# Patient Record
Sex: Female | Born: 1965
Health system: Southern US, Community
[De-identification: ages and names within clinical notes are randomized; demographics above are authoritative.]

## PROBLEM LIST (undated history)

## (undated) DIAGNOSIS — E669 Obesity, unspecified: Secondary | ICD-10-CM

## (undated) DIAGNOSIS — K219 Gastro-esophageal reflux disease without esophagitis: Secondary | ICD-10-CM

## (undated) DIAGNOSIS — M1990 Unspecified osteoarthritis, unspecified site: Secondary | ICD-10-CM

## (undated) DIAGNOSIS — Z87891 Personal history of nicotine dependence: Secondary | ICD-10-CM

## (undated) DIAGNOSIS — J329 Chronic sinusitis, unspecified: Secondary | ICD-10-CM

## (undated) DIAGNOSIS — F329 Major depressive disorder, single episode, unspecified: Secondary | ICD-10-CM

## (undated) DIAGNOSIS — R195 Other fecal abnormalities: Principal | ICD-10-CM

## (undated) DIAGNOSIS — I739 Peripheral vascular disease, unspecified: Secondary | ICD-10-CM

## (undated) DIAGNOSIS — F32A Depression, unspecified: Secondary | ICD-10-CM

## (undated) DIAGNOSIS — I219 Acute myocardial infarction, unspecified: Secondary | ICD-10-CM

## (undated) DIAGNOSIS — Z8541 Personal history of malignant neoplasm of cervix uteri: Secondary | ICD-10-CM

## (undated) DIAGNOSIS — R7303 Prediabetes: Secondary | ICD-10-CM

## (undated) DIAGNOSIS — E785 Hyperlipidemia, unspecified: Secondary | ICD-10-CM

## (undated) DIAGNOSIS — I2510 Atherosclerotic heart disease of native coronary artery without angina pectoris: Secondary | ICD-10-CM

## (undated) DIAGNOSIS — F419 Anxiety disorder, unspecified: Secondary | ICD-10-CM

## (undated) DIAGNOSIS — H018 Other specified inflammations of eyelid: Secondary | ICD-10-CM

## (undated) HISTORY — DX: Other fecal abnormalities: R19.5

## (undated) HISTORY — PX: OTHER SURGICAL HISTORY: SHX169

## (undated) HISTORY — PX: TONSILLECTOMY: SUR1361

## (undated) HISTORY — DX: Gastro-esophageal reflux disease without esophagitis: K21.9

## (undated) HISTORY — DX: Peripheral vascular disease, unspecified: I73.9

## (undated) HISTORY — DX: Personal history of malignant neoplasm of cervix uteri: Z85.41

## (undated) HISTORY — DX: Personal history of nicotine dependence: Z87.891

## (undated) HISTORY — DX: Other specified inflammations of eyelid: H01.8

## (undated) HISTORY — DX: Obesity, unspecified: E66.9

## (undated) HISTORY — DX: Chronic sinusitis, unspecified: J32.9

## (undated) HISTORY — PX: CARDIAC CATHETERIZATION: SHX172

## (undated) HISTORY — DX: Atherosclerotic heart disease of native coronary artery without angina pectoris: I25.10

## (undated) HISTORY — PX: DILATION AND CURETTAGE OF UTERUS: SHX78

## (undated) HISTORY — DX: Unspecified osteoarthritis, unspecified site: M19.90

---

## 2008-06-12 LAB — HM PAP SMEAR

## 2008-07-23 ENCOUNTER — Ambulatory Visit: Payer: Self-pay

## 2009-07-24 ENCOUNTER — Ambulatory Visit: Payer: Self-pay | Admitting: Internal Medicine

## 2009-07-28 ENCOUNTER — Ambulatory Visit: Payer: Self-pay | Admitting: Internal Medicine

## 2009-11-16 ENCOUNTER — Ambulatory Visit: Payer: Self-pay | Admitting: Internal Medicine

## 2010-07-21 ENCOUNTER — Observation Stay: Payer: Self-pay | Admitting: Internal Medicine

## 2010-08-04 ENCOUNTER — Ambulatory Visit: Payer: Self-pay | Admitting: Internal Medicine

## 2010-08-16 ENCOUNTER — Ambulatory Visit: Payer: Self-pay | Admitting: Internal Medicine

## 2011-04-29 ENCOUNTER — Encounter: Payer: Self-pay | Admitting: Internal Medicine

## 2011-05-10 ENCOUNTER — Encounter: Payer: Self-pay | Admitting: Internal Medicine

## 2011-05-10 ENCOUNTER — Ambulatory Visit (INDEPENDENT_AMBULATORY_CARE_PROVIDER_SITE_OTHER): Payer: Medicare Other | Admitting: Internal Medicine

## 2011-05-10 VITALS — BP 114/68 | HR 82 | Temp 97.4°F | Resp 16 | Ht 63.5 in | Wt 161.5 lb

## 2011-05-10 DIAGNOSIS — E785 Hyperlipidemia, unspecified: Secondary | ICD-10-CM

## 2011-05-10 DIAGNOSIS — Z1239 Encounter for other screening for malignant neoplasm of breast: Secondary | ICD-10-CM

## 2011-05-10 DIAGNOSIS — R5383 Other fatigue: Secondary | ICD-10-CM

## 2011-05-10 DIAGNOSIS — N921 Excessive and frequent menstruation with irregular cycle: Secondary | ICD-10-CM | POA: Insufficient documentation

## 2011-05-10 DIAGNOSIS — R1011 Right upper quadrant pain: Secondary | ICD-10-CM

## 2011-05-10 DIAGNOSIS — F411 Generalized anxiety disorder: Secondary | ICD-10-CM

## 2011-05-10 DIAGNOSIS — Z01419 Encounter for gynecological examination (general) (routine) without abnormal findings: Secondary | ICD-10-CM | POA: Insufficient documentation

## 2011-05-10 DIAGNOSIS — N939 Abnormal uterine and vaginal bleeding, unspecified: Secondary | ICD-10-CM

## 2011-05-10 DIAGNOSIS — N920 Excessive and frequent menstruation with regular cycle: Secondary | ICD-10-CM

## 2011-05-10 DIAGNOSIS — R5381 Other malaise: Secondary | ICD-10-CM

## 2011-05-10 DIAGNOSIS — Z124 Encounter for screening for malignant neoplasm of cervix: Secondary | ICD-10-CM

## 2011-05-10 LAB — CBC WITH DIFFERENTIAL/PLATELET
Basophils Absolute: 0 10*3/uL (ref 0.0–0.1)
Eosinophils Absolute: 0.1 10*3/uL (ref 0.0–0.7)
HCT: 35.9 % — ABNORMAL LOW (ref 36.0–46.0)
Lymphs Abs: 2.3 10*3/uL (ref 0.7–4.0)
MCHC: 33.4 g/dL (ref 30.0–36.0)
MCV: 84.7 fl (ref 78.0–100.0)
Monocytes Absolute: 0.5 10*3/uL (ref 0.1–1.0)
Neutro Abs: 5.5 10*3/uL (ref 1.4–7.7)
Platelets: 241 10*3/uL (ref 150.0–400.0)
RDW: 14.3 % (ref 11.5–14.6)

## 2011-05-10 LAB — COMPREHENSIVE METABOLIC PANEL
ALT: 20 U/L (ref 0–35)
Alkaline Phosphatase: 139 U/L — ABNORMAL HIGH (ref 39–117)
Creatinine, Ser: 0.9 mg/dL (ref 0.4–1.2)
Sodium: 139 mEq/L (ref 135–145)
Total Bilirubin: 0.6 mg/dL (ref 0.3–1.2)
Total Protein: 7.1 g/dL (ref 6.0–8.3)

## 2011-05-10 MED ORDER — BUSPIRONE HCL 30 MG PO TABS: 30.0000 mg | ORAL_TABLET | Freq: Two times a day (BID) | ORAL | Status: DC

## 2011-05-10 MED ORDER — ALPRAZOLAM 0.5 MG PO TABS: 0.5000 mg | ORAL_TABLET | Freq: Two times a day (BID) | ORAL | Status: AC | PRN

## 2011-05-10 NOTE — Patient Instructions (Addendum)
Continue buspirone 30 mg twice daily for anxiety Decrease the alprazolam dose to 0.5 mg up to two times daily  We are scheduling you for two ultrasounds.

## 2011-05-10 NOTE — Assessment & Plan Note (Signed)
PAP smear was done today.  Cervix was difficult to isolate due to retroverted position.

## 2011-05-10 NOTE — Assessment & Plan Note (Signed)
She has had minimal improvement over the past 2 years with trials of SSRIs.  Currently she is taking bispirone and alprazolam.  Her main manifestation is crying, which she does easily whever her children are discussed even in a favorable light.  I am increasing the buspirone to 30 mg twice daily and changing the alprazolam to ER as previously prescribed but not filled.  Prior trial of celexa caused weight gain which she did not tolerate despite improvement in symptoms.  I have again suggested that psychotherapy would be in her best interest but she is not interested .

## 2011-05-10 NOTE — Assessment & Plan Note (Signed)
With normal LH/FSH last year, I see no reason to repeat them.  Will check a TSH.  She has no history of obesity or hypertriglyceridemia.  Also checking a transvaginal u/s given to rule out fibroid uterus and evaluate her tenderness on the right side during manual exam.

## 2011-05-10 NOTE — Progress Notes (Signed)
  Subjective:    Patient ID: Kayla Farmer, female    DOB: Apr 09, 1966, 46 y.o.   MRN: [id number]  HPI Here for her annual physical.  Having menstrual irregularity with periods sometimes occuring twice monthly.  Bleeds 5 to 7 days , occasionally for 2 weeks,  Very heavy at times. Sometimes moderate cramping. Has had menstrual irregularities for 15 years,  No prior workup.     Review of Systems  Constitutional: Negative for fever, chills and unexpected weight change.  HENT: Negative for hearing loss, ear pain, nosebleeds, congestion, sore throat, facial swelling, rhinorrhea, sneezing, mouth sores, trouble swallowing, neck pain, neck stiffness, voice change, postnasal drip, sinus pressure, tinnitus and ear discharge.   Eyes: Negative for pain, discharge, redness and visual disturbance.  Respiratory: Negative for cough, chest tightness, shortness of breath, wheezing and stridor.   Cardiovascular: Negative for chest pain, palpitations and leg swelling.  Musculoskeletal: Negative for myalgias and arthralgias.  Skin: Negative for color change and rash.  Neurological: Negative for dizziness, weakness, light-headedness and headaches.  Hematological: Negative for adenopathy.       Objective:   Physical Exam  Constitutional: She is oriented to person, place, and time. She appears well-developed and well-nourished.  HENT:  Mouth/Throat: Oropharynx is clear and moist.  Eyes: EOM are normal. Pupils are equal, round, and reactive to light. No scleral icterus.  Neck: Normal range of motion. Neck supple. No JVD present. No thyromegaly present.  Cardiovascular: Normal rate, regular rhythm, normal heart sounds and intact distal pulses.   Pulmonary/Chest: Effort normal and breath sounds normal.  Abdominal: Soft. Bowel sounds are normal. She exhibits no mass. There is tenderness.       RUQ tenderness  Genitourinary: Vagina normal. No vaginal discharge found.       Tender in the RLQ ovary feels enlarged    Musculoskeletal: Normal range of motion. She exhibits no edema.  Lymphadenopathy:    She has no cervical adenopathy.  Neurological: She is alert and oriented to person, place, and time.  Skin: Skin is warm and dry.  Psychiatric: She has a normal mood and affect.          Assessment & Plan:

## 2011-05-10 NOTE — Assessment & Plan Note (Signed)
She has had annual mammograms which have been normal.  breast exam was done today

## 2011-05-11 ENCOUNTER — Other Ambulatory Visit (HOSPITAL_COMMUNITY)
Admission: RE | Admit: 2011-05-11 | Discharge: 2011-05-11 | Disposition: A | Discharge status: Home / Self Care | Payer: Medicare Other | Source: Ambulatory Visit | Attending: Internal Medicine | Admitting: Internal Medicine

## 2011-06-13 ENCOUNTER — Ambulatory Visit: Payer: Self-pay | Admitting: Internal Medicine

## 2011-06-20 ENCOUNTER — Encounter: Payer: Self-pay | Admitting: Internal Medicine

## 2011-06-20 ENCOUNTER — Telehealth: Payer: Self-pay | Admitting: Internal Medicine

## 2011-06-20 NOTE — Telephone Encounter (Signed)
Message copied by Andreas Ohm on Mon Jun 20, 2011 12:00 PM ------      Message from: Deborra Medina      Created: Wed Jun 15, 2011  6:28 PM      Regarding: ultrasound results       Her transvaginal ultraosound did not show any fibroids or masses  in the uterus , but she had a2 cm cyst on the right ovary that should be followed, so I would like her to see a gynecologist for followup.  Does she have a preference?

## 2011-06-20 NOTE — Telephone Encounter (Signed)
Notified patient of results, she does not have a preference on a gynecologist.  Please advise.

## 2011-06-21 NOTE — Telephone Encounter (Signed)
Either Dr. Enzo Bi at [facility] or Dr. Ferne Reus at [facility],  Whichever can see her sooner.

## 2011-07-04 ENCOUNTER — Telehealth: Payer: Self-pay | Admitting: Internal Medicine

## 2011-07-04 NOTE — Telephone Encounter (Signed)
Pt called checking her referral about gyn please call pt

## 2011-07-05 ENCOUNTER — Telehealth: Payer: Self-pay | Admitting: Internal Medicine

## 2011-07-05 NOTE — Telephone Encounter (Addendum)
Patient is needing a referral for her cyst on her ovary. Patient still having problem with pain down her side and in her back.

## 2011-07-05 NOTE — Telephone Encounter (Signed)
Yes, Dr. Derrel Nip wants her set up GYN.  See Telephone encounter from 10/8.  Can you set this up please?

## 2011-07-08 NOTE — Telephone Encounter (Signed)
I called and made appt at [facility] for patinet with Dr. Erik Obey for Monday Oct. 29, 2012 at 2:20 I called and advised patient of appointment information.  She is going to call and reschedule this appt to an appt. In the AM.

## 2011-08-30 ENCOUNTER — Ambulatory Visit: Payer: Self-pay | Admitting: Obstetrics and Gynecology

## 2011-09-28 ENCOUNTER — Ambulatory Visit: Payer: Self-pay | Admitting: General Surgery

## 2011-11-15 ENCOUNTER — Encounter: Payer: Self-pay | Admitting: Internal Medicine

## 2011-11-20 ENCOUNTER — Other Ambulatory Visit: Payer: Self-pay | Admitting: Internal Medicine

## 2012-05-20 ENCOUNTER — Other Ambulatory Visit: Payer: Self-pay | Admitting: Internal Medicine

## 2012-06-27 LAB — HM MAMMOGRAPHY: HM Mammogram: NORMAL

## 2012-08-27 ENCOUNTER — Other Ambulatory Visit: Payer: Self-pay | Admitting: Internal Medicine

## 2012-08-27 ENCOUNTER — Other Ambulatory Visit: Payer: Self-pay

## 2012-08-27 MED ORDER — OMEPRAZOLE 40 MG PO CPDR: 40.0000 mg | DELAYED_RELEASE_CAPSULE | Freq: Every day | ORAL | Status: DC

## 2012-08-27 NOTE — Telephone Encounter (Signed)
Refill for Omeprazole 40 mg # 30 3 R sent to [facility]

## 2012-09-13 ENCOUNTER — Ambulatory Visit: Payer: Self-pay | Admitting: Obstetrics and Gynecology

## 2013-05-23 ENCOUNTER — Other Ambulatory Visit: Payer: Self-pay | Admitting: Internal Medicine

## 2013-05-24 NOTE — Telephone Encounter (Signed)
Left message on cellphone to let pt know that she NTBS before we can refill her medications

## 2013-05-24 NOTE — Telephone Encounter (Signed)
?  Refill or make appt first-Pt has not been seen since 05/10/11

## 2013-05-24 NOTE — Telephone Encounter (Signed)
No refills without appt

## 2013-05-28 ENCOUNTER — Encounter: Payer: Self-pay | Admitting: Internal Medicine

## 2013-05-28 ENCOUNTER — Ambulatory Visit (INDEPENDENT_AMBULATORY_CARE_PROVIDER_SITE_OTHER): Payer: Medicare Other | Admitting: Internal Medicine

## 2013-05-28 VITALS — BP 118/78 | HR 64 | Temp 97.7°F | Resp 14 | Ht 63.25 in | Wt 158.8 lb

## 2013-05-28 DIAGNOSIS — E785 Hyperlipidemia, unspecified: Secondary | ICD-10-CM

## 2013-05-28 DIAGNOSIS — Z23 Encounter for immunization: Secondary | ICD-10-CM

## 2013-05-28 DIAGNOSIS — H543 Unqualified visual loss, both eyes: Secondary | ICD-10-CM | POA: Insufficient documentation

## 2013-05-28 DIAGNOSIS — K589 Irritable bowel syndrome without diarrhea: Secondary | ICD-10-CM

## 2013-05-28 DIAGNOSIS — R5381 Other malaise: Secondary | ICD-10-CM

## 2013-05-28 DIAGNOSIS — F411 Generalized anxiety disorder: Secondary | ICD-10-CM

## 2013-05-28 LAB — COMPREHENSIVE METABOLIC PANEL
AST: 17 U/L (ref 0–37)
Alkaline Phosphatase: 113 U/L (ref 39–117)
BUN: 16 mg/dL (ref 6–23)
Creatinine, Ser: 0.9 mg/dL (ref 0.4–1.2)
Glucose, Bld: 90 mg/dL (ref 70–99)
Total Bilirubin: 0.5 mg/dL (ref 0.3–1.2)

## 2013-05-28 LAB — CBC WITH DIFFERENTIAL/PLATELET
Basophils Relative: 0.7 % (ref 0.0–3.0)
Eosinophils Relative: 1.1 % (ref 0.0–5.0)
MCV: 83.6 fl (ref 78.0–100.0)
Monocytes Absolute: 0.4 10*3/uL (ref 0.1–1.0)
Monocytes Relative: 4.5 % (ref 3.0–12.0)
Neutrophils Relative %: 64.1 % (ref 43.0–77.0)
Platelets: 175 10*3/uL (ref 150.0–400.0)
RBC: 4.58 Mil/uL (ref 3.87–5.11)
WBC: 8.5 10*3/uL (ref 4.5–10.5)

## 2013-05-28 LAB — LIPID PANEL
Cholesterol: 179 mg/dL (ref 0–200)
LDL Cholesterol: 104 mg/dL — ABNORMAL HIGH (ref 0–99)
VLDL: 22.4 mg/dL (ref 0.0–40.0)

## 2013-05-28 LAB — TSH: TSH: 0.65 u[IU]/mL (ref 0.35–5.50)

## 2013-05-28 MED ORDER — BUSPIRONE HCL 30 MG PO TABS: ORAL_TABLET | ORAL | Status: DC

## 2013-05-28 MED ORDER — DICYCLOMINE HCL 20 MG PO TABS: 20.0000 mg | ORAL_TABLET | Freq: Four times a day (QID) | ORAL | Status: DC

## 2013-05-28 NOTE — Assessment & Plan Note (Signed)
continue Buspar.  Again recommended referral to psychiatry.

## 2013-05-28 NOTE — Patient Instructions (Addendum)
Your stomach and bowel symptoms sound like "irritable Bowel syndrome  Try taking the dicyclomine 30 minutes before your meal  Try taking Mylanta Gas after a meal for the gas pains   If dairy give you gas .  You need to try lactase (over the counter) with your meal.   If you change your mind about seeing a doctor or therapist to help your anxiety ,  Please call me back and we will arrange it for you  You received the tetanus diptheria and pertussis vaccine today

## 2013-05-28 NOTE — Assessment & Plan Note (Signed)
Trial of dicyclomine for postprandial cramping and diarrhea.

## 2013-05-28 NOTE — Assessment & Plan Note (Signed)
Recommended formal eye exam with [facility]

## 2013-05-28 NOTE — Progress Notes (Signed)
Patient ID: Kayla Farmer, female   DOB: [birth year], 47 y.o.   MRN: [id number]   Patient Active Problem List   Diagnosis Date Noted  . Decreased vision in both eyes 05/28/2013  . Irritable bowel syndrome 05/28/2013  . Generalized anxiety disorder 05/10/2011  . Screening for cervical cancer 05/10/2011  . Screening for breast cancer 05/10/2011  . Menometrorrhagia 05/10/2011    Subjective:  CC:   Chief Complaint  Patient presents with  . Follow-up    al most 2 years since last visit    HPI:   Kayla Farmer a 47 y.o. female who presents 2 year follow up on chronic conditions  including social anxiety disorder Last seen 2 years ago. Prior trials of SSRIS cuased weigth gain.  Tolerating Buspar for the past 2 years .  Has historically deferred psychiatry referral.  Has noted Decreased vision over the last year but has not made a referral for an eye exam yet.   Couple of minor injuries this summer:  1) Rightadductor muscle pain since stopping off the tuck bed and hearing a pop,  No bruising at the time   2) right wrist pain since missing the back door step.  Wore a brace on the wrist  For a couple of weeks   History of carpal tunnel and  Tendonitis /wrist sprain.  Marland Kitchen  usins prn aleve and ibuprofen   Mood disorder:  Son doing well,  Living apart .  Other son graduates this year.  Gest tearful just talking about her mood.  Has episodes of loose stools occurring once or twice a week,  Very sudden ,  when it occurs she has several loose stools per day ,  Then resolves in 24 hours. Not aggravated by food,  Does not drink coffee.  Occasionally occurs post prandially. 6 months to one year   Not escalating. .  Does not alternate  with constipation.  Was Having problems with crampy abdominal pain not frelieved by daily use of Prilosec and was referred by GYN for a CT scan at [facility] with oral contrast  whifch caused uncontrollable diarrhea afterward at the [place].  CT scan was normal.    Past  Medical History  Diagnosis Date  . Other inflammations of eyelids   . History of cervical cancer     No past surgical history on file.     The following portions of the patient's history were reviewed and updated as appropriate: Allergies, current medications, and problem list.    Review of Systems:   12 Pt  review of systems was negative except those addressed in the HPI,     History   Social History  . Marital Status: Married    Spouse Name: N/A    Number of Children: N/A  . Years of Education: N/A   Occupational History  . Not on file.   Social History Main Topics  . Smoking status: Current Every Day Smoker -- 1.00 packs/day    Types: Cigarettes  . Smokeless tobacco: Never Used     Comment: smokes one pack of cigarettes per day since age 50, quit during pregnancies.  . Alcohol Use: No  . Drug Use: No  . Sexual Activity: Not on file   Other Topics Concern  . Not on file   Social History Narrative   Lives with spouse and children.  Daughter in Sports coach of Loletha Carrow and Josph Macho.   Quit high school in the 9th grade.  Was raised by grandmother due  to parental conflicts.    Objective:  Filed Vitals:   05/28/13 0937  BP: 118/78  Pulse: 64  Temp: 97.7 F (36.5 C)  Resp: 14     General appearance: alert, cooperative and appears stated age Ears: normal TM's and external ear canals both ears Throat: lips, mucosa, and tongue normal; teeth and gums normal Neck: no adenopathy, no carotid bruit, supple, symmetrical, trachea midline and thyroid not enlarged, symmetric, no tenderness/mass/nodules Back: symmetric, no curvature. ROM normal. No CVA tenderness. Lungs: clear to auscultation bilaterally Heart: regular rate and rhythm, S1, S2 normal, no murmur, click, rub or gallop Abdomen: soft, non-tender; bowel sounds normal; no masses,  no organomegaly Pulses: 2+ and symmetric Skin: Skin color, texture, turgor normal. No rashes or lesions Lymph nodes: Cervical,  supraclavicular, and axillary nodes normal.  Assessment and Plan:  Generalized anxiety disorder continue Buspar.  Again recommended referral to psychiatry.  Decreased vision in both eyes Recommended formal eye exam with [facility]   Irritable bowel syndrome Trial of dicyclomine for postprandial cramping and diarrhea.    Updated Medication List Outpatient Encounter Prescriptions as of 05/28/2013  Medication Sig Dispense Refill  . busPIRone (BUSPAR) 30 MG tablet take 1 tablet by mouth twice a day  60 tablet  5  . meloxicam (MOBIC) 15 MG tablet Take one by mouth daily as needed for shoulder pain.       Marland Kitchen omeprazole (PRILOSEC) 40 MG capsule Take 1 capsule (40 mg total) by mouth daily. Take one by mouth daily as needed  30 capsule  3  . [DISCONTINUED] busPIRone (BUSPAR) 30 MG tablet take 1 tablet by mouth twice a day  60 tablet  5  . dicyclomine (BENTYL) 20 MG tablet Take 1 tablet (20 mg total) by mouth every 6 (six) hours.  90 tablet  3  . estradiol (ESTRACE) 0.5 MG tablet Take 1 tablet by mouth daily.      . [DISCONTINUED] methocarbamol (ROBAXIN) 500 MG tablet Take one by mouth 3 times daily as needed for muscle spasms       . [DISCONTINUED] terbinafine (LAMISIL) 250 MG tablet Take 250 mg by mouth daily.         No facility-administered encounter medications on file as of 05/28/2013.     Orders Placed This Encounter  Procedures  . HM MAMMOGRAPHY  . Tdap vaccine greater than or equal to 7yo IM  . HM PAP SMEAR  . Lipid panel  . TSH  . Comprehensive metabolic panel  . CBC with Differential    No Follow-up on file.

## 2013-05-30 ENCOUNTER — Encounter: Payer: Self-pay | Admitting: *Deleted

## 2013-09-16 ENCOUNTER — Other Ambulatory Visit: Payer: Self-pay | Admitting: *Deleted

## 2013-09-16 MED ORDER — OMEPRAZOLE 40 MG PO CPDR: 40.0000 mg | DELAYED_RELEASE_CAPSULE | Freq: Every day | ORAL | Status: DC

## 2013-09-17 ENCOUNTER — Ambulatory Visit: Payer: Self-pay | Admitting: Obstetrics and Gynecology

## 2013-10-23 ENCOUNTER — Telehealth: Payer: Self-pay | Admitting: Internal Medicine

## 2013-10-23 NOTE — Telephone Encounter (Signed)
Error

## 2013-10-24 ENCOUNTER — Encounter (INDEPENDENT_AMBULATORY_CARE_PROVIDER_SITE_OTHER): Payer: Self-pay

## 2013-10-24 ENCOUNTER — Encounter: Payer: Self-pay | Admitting: Adult Health

## 2013-10-24 ENCOUNTER — Ambulatory Visit (INDEPENDENT_AMBULATORY_CARE_PROVIDER_SITE_OTHER): Payer: Medicare Other | Admitting: Adult Health

## 2013-10-24 VITALS — BP 120/74 | HR 89 | Temp 98.2°F | Resp 14 | Wt 159.8 lb

## 2013-10-24 DIAGNOSIS — F411 Generalized anxiety disorder: Secondary | ICD-10-CM

## 2013-10-24 DIAGNOSIS — N912 Amenorrhea, unspecified: Secondary | ICD-10-CM

## 2013-10-24 LAB — FOLLICLE STIMULATING HORMONE: FSH: 14.2 m[IU]/mL

## 2013-10-24 LAB — LUTEINIZING HORMONE: LH: 32.27 m[IU]/mL

## 2013-10-24 LAB — POCT URINE PREGNANCY: Preg Test, Ur: NEGATIVE

## 2013-10-24 NOTE — Patient Instructions (Signed)
  Please have your labs drawn prior to leaving the office.  We will notify you of the results once they are available..  I am referring you to Psychiatry for your uncontrolled anxiety. You will be called with an appointment.

## 2013-10-24 NOTE — Progress Notes (Signed)
Pre visit review using our clinic review tool, if applicable. No additional management support is needed unless otherwise documented below in the visit note. 

## 2013-10-24 NOTE — Progress Notes (Signed)
Patient ID: Kayla Farmer, female   DOB: 01-16-1966, 48 y.o.   MRN: [id number]    Subjective:    Patient ID: Kayla Farmer, female    DOB: 1965/10/15, 48 y.o.   MRN: [id number]  HPI  Pt present to clinic with c/o "nerves". She is crying uncontrollably. States "I am always like this". She reports being like this since childhood. Feels her symptoms are progressing. She has multiple crying episodes daily. She takes buspar with good results. Only taking daily 2/2 weight gain. Was on Lexapro and doing well but stopped 2/2 weight gain.  Concerned about not having period since December. Sexually active. No other symptoms.  Past Medical History  Diagnosis Date  . Other inflammations of eyelids   . History of cervical cancer     Current Outpatient Prescriptions on File Prior to Visit  Medication Sig Dispense Refill  . busPIRone (BUSPAR) 30 MG tablet take 1 tablet by mouth twice a day  60 tablet  5  . estradiol (ESTRACE) 0.5 MG tablet Take 1 tablet by mouth daily.      Marland Kitchen omeprazole (PRILOSEC) 40 MG capsule Take 1 capsule (40 mg total) by mouth daily. Take one by mouth daily as needed  30 capsule  5   No current facility-administered medications on file prior to visit.     Review of Systems  Constitutional: Negative.   Respiratory: Negative.   Cardiovascular: Negative.   Gastrointestinal:       Ongoing, intermittent abdominal discomfort. Has been w/u with CT, ultrasound which were negative. Denies pain this morning.  Genitourinary:       No menstrual cycle since December 2014  Psychiatric/Behavioral: Negative for suicidal ideas and self-injury. The patient is nervous/anxious.        Uncontrolled crying, increased anxiety       Objective:  BP 120/74  Pulse 89  Temp(Src) 98.2 F (36.8 C) (Oral)  Resp 14  Wt 159 lb 12.8 oz (72.485 kg)  SpO2 97%  LMP 08/27/2013   Physical Exam  Constitutional: She is oriented to person, place, and time. She appears well-developed and well-nourished. No  distress.  Cardiovascular: Normal rate, regular rhythm and normal heart sounds.   Pulmonary/Chest: Effort normal and breath sounds normal. No respiratory distress. She has no wheezes. She has no rales.  Abdominal: Soft. Bowel sounds are normal. She exhibits no distension and no mass. There is no tenderness. There is no rebound and no guarding.  Musculoskeletal: Normal range of motion.  Neurological: She is alert and oriented to person, place, and time.  Psychiatric: Judgment and thought content normal.  Uncontrollably crying throughout visit. Unable to state reason for her crying other than "I've always been like this"          Assessment & Plan:    1. Generalized anxiety disorder This has been a chronic problem. She has been on Lexapro and felt she was doing well; however, it made her gain weight and she discontinued the medication. She was prescribed buspar bid prn but is only taking daily for the same reason. Her PCP has been recommending referral to psychiatry. She is agreeable. Will refer.  2. Amenorrhea Urine preg negative Check FSH, LH - Discussed not considered menopausal until no period for 12 months. She will still need to use precaution to prevent pregnancy.

## 2013-10-25 ENCOUNTER — Telehealth: Payer: Self-pay | Admitting: Internal Medicine

## 2013-10-25 NOTE — Telephone Encounter (Signed)
Relevant patient education mailed to patient.  

## 2014-02-05 ENCOUNTER — Telehealth: Payer: Self-pay | Admitting: Internal Medicine

## 2014-02-05 DIAGNOSIS — L247 Irritant contact dermatitis due to plants, except food: Secondary | ICD-10-CM

## 2014-02-05 MED ORDER — PREDNISONE (PAK) 10 MG PO TABS: ORAL_TABLET | ORAL | Status: DC

## 2014-02-05 NOTE — Telephone Encounter (Signed)
Patient Information:  Caller Name: Merissa  Phone: [phone]  Patient: Kayla Farmer, Kayla Farmer  Gender: Female  DOB: [birth year]  Age: 48 Years  PCP: Deborra Medina (Adults only)  Pregnant: No  Office Follow Up:  Does the office need to follow up with this patient?: Yes  Instructions For The Office: PATIENT REQUESTING PRESCRIPTION. NO APPT AT OFFICE.  RN Note:  No appt available in office today. Unable to go to another location. She is requesting Rx.medication.  Pharmacy- [facility] on [address]. PLEASE CONTACT PATIENT  Symptoms  Reason For Call & Symptoms: Patient reports Poison Ivy onset  Sunday , 01/26/14.  Location- on left hand , both arms, both legs,  below lip and it appears to be spreading, +itching and restless sleep.  Patient is requesting Rx.  Son is Radio broadcast assistant , unable to come to office.  Reviewed Health History In EMR: Yes  Reviewed Medications In EMR: Yes  Reviewed Allergies In EMR: Yes  Reviewed Surgeries / Procedures: Yes  Date of Onset of Symptoms: 01/26/2014  Treatments Tried: Calamine Lotion  Treatments Tried Worked: No OB / GYN:  LMP: 10/13/2013  Guideline(s) Used:  Poison [place] or [place]  Disposition Per Guideline:   See Today in Office  Reason For Disposition Reached:   Severe itching interferes with normal activities (e.g., work or school) or prevents sleep  Advice Given:  Hydrocortisone Cream for Itching:   Apply 1% hydrocortisone cream 4 times a day to reduce itching. Use it for 5 days.  Keep the cream in the refrigerator (Reason: it feels better if applied cold).  Apply Cold to the Area:  Soak the involved area in cool water for 20 minutes or massage it with an ice cube as often as necessary to reduce itching and oozing.  Oral Antihistamine Medication for Itching:   Antihistamines may cause sleepiness. Do not drink, drive, or operate dangerous machinery while taking antihistamines.  An over-the-counter antihistamine that causes less sleepiness is  loratadine (e.g., Alavert or Claritin).  Read the package instructions thoroughly on all medications that you take.  Avoid Scratching:   Cut your fingernails short and try not to scratch so as to prevent a secondary infection from bacteria.  New Blisters Appear:  If new blisters occur several days after the first ones, you probably have had ongoing contact with the irritating plant oil. To prevent recurrences: bathe all dogs and wash all clothes and shoes that were with you on the day of exposure.  Expected Course:  Usually lasts 2 weeks. Treatment reduces the severity of the symptoms, not how long they last.  Call Back If:  Rash lasts longer than 3 weeks  It looks infected  You become worse.  RN Overrode Recommendation:  Patient Requests Prescription  PATIENT REQUESTING PRESCRIPTION. NO APPT AT OFFICE.

## 2014-02-05 NOTE — Telephone Encounter (Signed)
Patient notified of instructions and voiced understanding.

## 2014-02-05 NOTE — Telephone Encounter (Signed)
Please advise patient states she cannot come in due to sons graduation patient stated she has poison Ivy on bilateral hands and legs.

## 2014-02-05 NOTE — Telephone Encounter (Signed)
Follow instructiosn by Triage RN.,  predniosne taper sent to pharmacy

## 2014-02-14 ENCOUNTER — Other Ambulatory Visit: Payer: Self-pay | Admitting: *Deleted

## 2014-02-14 MED ORDER — OMEPRAZOLE 40 MG PO CPDR: 40.0000 mg | DELAYED_RELEASE_CAPSULE | Freq: Every day | ORAL | Status: DC

## 2014-05-23 ENCOUNTER — Other Ambulatory Visit: Payer: Self-pay | Admitting: Internal Medicine

## 2014-07-30 ENCOUNTER — Telehealth: Payer: Self-pay

## 2014-07-30 NOTE — Telephone Encounter (Signed)
LVM for pt to call back,    RE: AWV 2015 scheduled with [facility] if possible.

## 2014-08-06 ENCOUNTER — Encounter: Payer: Medicare Other | Admitting: Nurse Practitioner

## 2014-08-18 ENCOUNTER — Other Ambulatory Visit: Payer: Self-pay | Admitting: Internal Medicine

## 2014-09-08 ENCOUNTER — Other Ambulatory Visit: Payer: Self-pay | Admitting: *Deleted

## 2014-09-08 MED ORDER — OMEPRAZOLE 40 MG PO CPDR: 40.0000 mg | DELAYED_RELEASE_CAPSULE | Freq: Every day | ORAL | Status: DC

## 2014-09-18 ENCOUNTER — Encounter: Payer: Self-pay | Admitting: Nurse Practitioner

## 2014-09-18 ENCOUNTER — Ambulatory Visit (INDEPENDENT_AMBULATORY_CARE_PROVIDER_SITE_OTHER): Payer: Medicare Other | Admitting: Nurse Practitioner

## 2014-09-18 VITALS — BP 110/66 | HR 80 | Temp 98.1°F | Resp 12 | Ht 63.25 in | Wt 163.1 lb

## 2014-09-18 DIAGNOSIS — Z1211 Encounter for screening for malignant neoplasm of colon: Secondary | ICD-10-CM | POA: Diagnosis not present

## 2014-09-18 DIAGNOSIS — Z131 Encounter for screening for diabetes mellitus: Secondary | ICD-10-CM | POA: Diagnosis not present

## 2014-09-18 DIAGNOSIS — Z13220 Encounter for screening for lipoid disorders: Secondary | ICD-10-CM

## 2014-09-18 DIAGNOSIS — Z0000 Encounter for general adult medical examination without abnormal findings: Secondary | ICD-10-CM

## 2014-09-18 MED ORDER — BUSPIRONE HCL 30 MG PO TABS: 30.0000 mg | ORAL_TABLET | Freq: Every day | ORAL | Status: DC

## 2014-09-18 MED ORDER — OMEPRAZOLE 40 MG PO CPDR
40.0000 mg | DELAYED_RELEASE_CAPSULE | Freq: Every day | ORAL | Status: DC
Start: 2014-09-18 — End: 2015-04-03

## 2014-09-18 NOTE — Progress Notes (Signed)
Pre visit review using our clinic review tool, if applicable. No additional management support is needed unless otherwise documented below in the visit note. 

## 2014-09-18 NOTE — Patient Instructions (Addendum)
Please make a lab appointment before leaving today.   We will see you next year for another Annual Wellness Visit (this is what you had today).   Call us sooner if you need [place].   We will contact you about your referral for a Colonoscopy.    Health Maintenance Adopting a healthy lifestyle and getting preventive care can go a long way to promote health and wellness. Talk with your health care provider about what schedule of regular examinations is right for you. This is a good chance for you to check in with your provider about disease prevention and staying healthy. In between checkups, there are plenty of things you can do on your own. Experts have done a lot of research about which lifestyle changes and preventive measures are most likely to keep you healthy. Ask your health care provider for more information. WEIGHT AND DIET  Eat a healthy diet  Be sure to include plenty of vegetables, fruits, low-fat dairy products, and lean protein.  Do not eat a lot of foods high in solid fats, added sugars, or salt.  Get regular exercise. This is one of the most important things you can do for your health.  Most adults should exercise for at least 150 minutes each week. The exercise should increase your heart rate and make you sweat (moderate-intensity exercise).  Most adults should also do strengthening exercises at least twice a week. This is in addition to the moderate-intensity exercise.  Maintain a healthy weight  Body mass index (BMI) is a measurement that can be used to identify possible weight problems. It estimates body fat based on height and weight. Your health care provider can help determine your BMI and help you achieve or maintain a healthy weight.  For females 51 years of age and older:   A BMI below 18.5 is considered underweight.  A BMI of 18.5 to 24.9 is normal.  A BMI of 25 to 29.9 is considered overweight.  A BMI of 30 and above is considered obese.  Watch levels of  cholesterol and blood lipids  You should start having your blood tested for lipids and cholesterol at 51 years of age, then have this test every 5 years.  You may need to have your cholesterol levels checked more often if:  Your lipid or cholesterol levels are high.  You are older than 51 years of age.  You are at high risk for heart disease.  CANCER SCREENING   Lung Cancer  Lung cancer screening is recommended for adults 15-26 years old who are at high risk for lung cancer because of a history of smoking.  A yearly low-dose CT scan of the lungs is recommended for people who:  Currently smoke.  Have quit within the past 15 years.  Have at least a 30-pack-year history of smoking. A pack year is smoking an average of one pack of cigarettes a day for 1 year.  Yearly screening should continue until it has been 15 years since you quit.  Yearly screening should stop if you develop a health problem that would prevent you from having lung cancer treatment.  Breast Cancer  Practice breast self-awareness. This means understanding how your breasts normally appear and feel.  It also means doing regular breast self-exams. Let your health care provider know about any changes, no matter how small.  If you are in your 20s or 30s, you should have a clinical breast exam (CBE) by a health care provider every 1-3 years  as part of a regular health exam.  If you are 65 or older, have a CBE every year. Also consider having a breast X-ray (mammogram) every year.  If you have a family history of breast cancer, talk to your health care provider about genetic screening.  If you are at high risk for breast cancer, talk to your health care provider about having an MRI and a mammogram every year.  Breast cancer gene (BRCA) assessment is recommended for women who have family members with BRCA-related cancers. BRCA-related cancers include:  Breast.  Ovarian.  Tubal.  Peritoneal  cancers.  Results of the assessment will determine the need for genetic counseling and BRCA1 and BRCA2 testing. Cervical Cancer Routine pelvic examinations to screen for cervical cancer are no longer recommended for nonpregnant women who are considered low risk for cancer of the pelvic organs (ovaries, uterus, and vagina) and who do not have symptoms. A pelvic examination may be necessary if you have symptoms including those associated with pelvic infections. Ask your health care provider if a screening pelvic exam is right for you.   The Pap test is the screening test for cervical cancer for women who are considered at risk.  If you had a hysterectomy for a problem that was not cancer or a condition that could lead to cancer, then you no longer need Pap tests.  If you are older than 65 years, and you have had normal Pap tests for the past 10 years, you no longer need to have Pap tests.  If you have had past treatment for cervical cancer or a condition that could lead to cancer, you need Pap tests and screening for cancer for at least 20 years after your treatment.  If you no longer get a Pap test, assess your risk factors if they change (such as having a new sexual partner). This can affect whether you should start being screened again.  Some women have medical problems that increase their chance of getting cervical cancer. If this is the case for you, your health care provider may recommend more frequent screening and Pap tests.  The human papillomavirus (HPV) test is another test that may be used for cervical cancer screening. The HPV test looks for the virus that can cause cell changes in the cervix. The cells collected during the Pap test can be tested for HPV.  The HPV test can be used to screen women 64 years of age and older. Getting tested for HPV can extend the interval between normal Pap tests from three to five years.  An HPV test also should be used to screen women of any age who  have unclear Pap test results.  After 51 years of age, women should have HPV testing as often as Pap tests.  Colorectal Cancer  This type of cancer can be detected and often prevented.  Routine colorectal cancer screening usually begins at 52 years of age and continues through 51 years of age.  Your health care provider may recommend screening at an earlier age if you have risk factors for colon cancer.  Your health care provider may also recommend using home test kits to check for hidden blood in the stool.  A small camera at the end of a tube can be used to examine your colon directly (sigmoidoscopy or colonoscopy). This is done to check for the earliest forms of colorectal cancer.  Routine screening usually begins at age 57.  Direct examination of the colon should be repeated every  5-10 years through 51 years of age. However, you may need to be screened more often if early forms of precancerous polyps or small growths are found. Skin Cancer  Check your skin from head to toe regularly.  Tell your health care provider about any new moles or changes in moles, especially if there is a change in a mole's shape or color.  Also tell your health care provider if you have a mole that is larger than the size of a pencil eraser.  Always use sunscreen. Apply sunscreen liberally and repeatedly throughout the day.  Protect yourself by wearing long sleeves, pants, a wide-brimmed hat, and sunglasses whenever you are outside. HEART DISEASE, DIABETES, AND HIGH BLOOD PRESSURE   Have your blood pressure checked at least every 1-2 years. High blood pressure causes heart disease and increases the risk of stroke.  If you are between 86 years and 74 years old, ask your health care provider if you should take aspirin to prevent strokes.  Have regular diabetes screenings. This involves taking a blood sample to check your fasting blood sugar level.  If you are at a normal weight and have a low risk for  diabetes, have this test once every three years after 51 years of age.  If you are overweight and have a high risk for diabetes, consider being tested at a younger age or more often. PREVENTING INFECTION  Hepatitis B  If you have a higher risk for hepatitis B, you should be screened for this virus. You are considered at high risk for hepatitis B if:  You were born in a country where hepatitis B is common. Ask your health care provider which countries are considered high risk.  Your parents were born in a high-risk country, and you have not been immunized against hepatitis B (hepatitis B vaccine).  You have HIV or AIDS.  You use needles to inject street drugs.  You live with someone who has hepatitis B.  You have had sex with someone who has hepatitis B.  You get hemodialysis treatment.  You take certain medicines for conditions, including cancer, organ transplantation, and autoimmune conditions. Hepatitis C  Blood testing is recommended for:  Everyone born from [birth year] through 1967.  Anyone with known risk factors for hepatitis C. Sexually transmitted infections (STIs)  You should be screened for sexually transmitted infections (STIs) including gonorrhea and chlamydia if:  You are sexually active and are younger than 51 years of age.  You are older than 51 years of age and your health care provider tells you that you are at risk for this type of infection.  Your sexual activity has changed since you were last screened and you are at an increased risk for chlamydia or gonorrhea. Ask your health care provider if you are at risk.  If you do not have HIV, but are at risk, it may be recommended that you take a prescription medicine daily to prevent HIV infection. This is called pre-exposure prophylaxis (PrEP). You are considered at risk if:  You are sexually active and do not regularly use condoms or know the HIV status of your partner(s).  You take drugs by injection.  You are  sexually active with a partner who has HIV. Talk with your health care provider about whether you are at high risk of being infected with HIV. If you choose to begin PrEP, you should first be tested for HIV. You should then be tested every 3 months for as long as you  are taking PrEP.  PREGNANCY   If you are premenopausal and you may become pregnant, ask your health care provider about preconception counseling.  If you may become pregnant, take 400 to 800 micrograms (mcg) of folic acid every day.  If you want to prevent pregnancy, talk to your health care provider about birth control (contraception). OSTEOPOROSIS AND MENOPAUSE   Osteoporosis is a disease in which the bones lose minerals and strength with aging. This can result in serious bone fractures. Your risk for osteoporosis can be identified using a bone density scan.  If you are 66 years of age or older, or if you are at risk for osteoporosis and fractures, ask your health care provider if you should be screened.  Ask your health care provider whether you should take a calcium or vitamin D supplement to lower your risk for osteoporosis.  Menopause may have certain physical symptoms and risks.  Hormone replacement therapy may reduce some of these symptoms and risks. Talk to your health care provider about whether hormone replacement therapy is right for you.  HOME CARE INSTRUCTIONS   Schedule regular health, dental, and eye exams.  Stay current with your immunizations.   Do not use any tobacco products including cigarettes, chewing tobacco, or electronic cigarettes.  If you are pregnant, do not drink alcohol.  If you are breastfeeding, limit how much and how often you drink alcohol.  Limit alcohol intake to no more than 1 drink per day for nonpregnant women. One drink equals 12 ounces of beer, 5 ounces of wine, or 1 ounces of hard liquor.  Do not use street drugs.  Do not share needles.  Ask your health care provider for  help if you need support or information about quitting drugs.  Tell your health care provider if you often feel depressed.  Tell your health care provider if you have ever been abused or do not feel safe at home. Document Released: 03/14/2011 Document Revised: 01/13/2014 Document Reviewed: 07/31/2013 [facility] Patient Information 2015 [place], [place]. This information is not intended to replace advice given to you by your health care provider. Make sure you discuss any questions you have with your health care provider.

## 2014-09-18 NOTE — Progress Notes (Signed)
Subjective:    Patient ID: Kayla Farmer, female    DOB: [birth year], 49 y.o.   MRN: [id number]  [place]- 09/19/2014 Pap Smear- Dr. Cristino Martes at [facility]- 07/2014  Colonoscopy- Need for referral Glaucoma- 3 years ago, blurry vision currently, needs check up Hearing- Denies issues Hemoglobin A1C- Not done in past at our office Cholesterol- 2014   Social  Alcohol intake- Denies Smoking history- Yes- 1 ppd or less Smokers in home- Yes Illicit drug use- Denies Exercise- Denies Diet- Cooks at home Sexually Active- No  Multiple Partners- Denies  Safety  Patient feesl safe at home- Yes Patient does have smoke detectors at home- Yes Patient does wear sunscreen or protective clothing when in direct sunlight- No  Patient does wear seat belt when driving or riding with others- Yes  Activities of Daily Living Patient can do their own household chores. Denies needing assistance with: driving, feeding themselves, getting from bed to chair, getting to the toilet, bathing/showering, dressing, managing money, climbing flight of stairs, or preparing meals.   Depression Screen Patient states she does have issues with losing interest in daily life, feeling hopeless, or crying easily over simple problems. She states she has dealt with these problems her whole life, but as she gets older they worsen. She is on Buspar 30 mg daily and it is helpful.    Fall Screen Patient denies being afraid of falling or falling in the last year.  Memory Screen Patient is positive for problems with memory and misplacing items, but is able to balance checkbook/bank accounts.  Patient is alert, normal appearance, oriented to person/place/and time. Correctly identified the president of the [place], recall of 2/3 objects, and performing simple calculations.  Patient displays appropriate judgement and can read correct time from watch face.   Patient forgets what she was doing "mind goes blank" few  times a week.   Immunizations The following Immunizations are up to date: Refuses Influenza vaccination, shingles, pneumonia, and tetanus.   Other Providers: Dr. Cristino Martes for GYN  Dr. Derrel Nip- PCP  Review of Systems No review of systems for Annual Wellness visit     Objective:   Physical Exam No physical exam for annual wellness visit  BP 110/66 mmHg  Pulse 80  Temp(Src) 98.1 F (36.7 C) (Oral)  Resp 12  Ht 5' 3.25" (1.607 m)  Wt 163 lb 1.9 oz (73.991 kg)  BMI 28.65 kg/m2  SpO2 97%     Assessment & Plan:  This is a routine wellness examination for this patient.  I reviewed all health maintenance protocols including mammography and colonoscopy. Needed referrals placed. Age and diagnosis appropriate screening labs were ordered.  Her immunization history was reviewed. Her current medications and allergies were reviewed and needed refills of her chronic medications were ordered if needed.  The plan for yearly health maintenance was discussed.     MEDICARE ATTESTATION I have personally reviewed:  The patient's medical and social history. The use of alcohol, tobacco and illicit drugs. The current medications and supplements. The patient's function ability including ADLs, fall risks, home safety risks, cognitive, and hearing and visual impairment.   Diet and physical activities. Evaluation for depression and mood disorders.    The patient's weight, height, BMI and visual acuity have been recorded in the chart.  I have made referrals, counseled and provided education to the patient based on review of the above.   A handout with health maintenance information was given to the patient upon  departure. Highlighted were specifically discussed items for further reading.

## 2014-09-19 ENCOUNTER — Other Ambulatory Visit (INDEPENDENT_AMBULATORY_CARE_PROVIDER_SITE_OTHER): Payer: Medicare Other

## 2014-09-19 ENCOUNTER — Ambulatory Visit: Payer: Self-pay | Admitting: Internal Medicine

## 2014-09-19 DIAGNOSIS — Z131 Encounter for screening for diabetes mellitus: Secondary | ICD-10-CM

## 2014-09-19 DIAGNOSIS — Z13220 Encounter for screening for lipoid disorders: Secondary | ICD-10-CM | POA: Diagnosis not present

## 2014-09-19 DIAGNOSIS — Z1231 Encounter for screening mammogram for malignant neoplasm of breast: Secondary | ICD-10-CM | POA: Diagnosis not present

## 2014-09-19 LAB — HM MAMMOGRAPHY: HM Mammogram: NEGATIVE

## 2014-09-19 LAB — LIPID PANEL
CHOLESTEROL: 220 mg/dL — AB (ref 0–200)
HDL: 53.1 mg/dL (ref >39.00)
LDL Cholesterol: 144 mg/dL — ABNORMAL HIGH (ref 0–99)
NonHDL: 166.9
Total CHOL/HDL Ratio: 4
Triglycerides: 113 mg/dL (ref 0.0–149.0)
VLDL: 22.6 mg/dL (ref 0.0–40.0)

## 2014-09-19 LAB — HEMOGLOBIN A1C: Hgb A1c MFr Bld: 6 % (ref 4.6–6.5)

## 2014-09-22 ENCOUNTER — Encounter: Payer: Self-pay | Admitting: *Deleted

## 2014-10-08 ENCOUNTER — Encounter: Payer: Self-pay | Admitting: Internal Medicine

## 2014-11-21 DIAGNOSIS — F329 Major depressive disorder, single episode, unspecified: Secondary | ICD-10-CM | POA: Diagnosis not present

## 2014-11-21 DIAGNOSIS — N951 Menopausal and female climacteric states: Secondary | ICD-10-CM | POA: Diagnosis not present

## 2014-11-21 DIAGNOSIS — E559 Vitamin D deficiency, unspecified: Secondary | ICD-10-CM | POA: Diagnosis not present

## 2015-01-28 DIAGNOSIS — J019 Acute sinusitis, unspecified: Secondary | ICD-10-CM | POA: Diagnosis not present

## 2015-01-28 DIAGNOSIS — H6982 Other specified disorders of Eustachian tube, left ear: Secondary | ICD-10-CM | POA: Diagnosis not present

## 2015-01-28 DIAGNOSIS — H6502 Acute serous otitis media, left ear: Secondary | ICD-10-CM | POA: Diagnosis not present

## 2015-01-28 DIAGNOSIS — J301 Allergic rhinitis due to pollen: Secondary | ICD-10-CM | POA: Diagnosis not present

## 2015-01-28 DIAGNOSIS — H902 Conductive hearing loss, unspecified: Secondary | ICD-10-CM | POA: Diagnosis not present

## 2015-02-04 DIAGNOSIS — E663 Overweight: Secondary | ICD-10-CM | POA: Diagnosis not present

## 2015-02-04 DIAGNOSIS — F329 Major depressive disorder, single episode, unspecified: Secondary | ICD-10-CM | POA: Diagnosis not present

## 2015-02-04 DIAGNOSIS — R7989 Other specified abnormal findings of blood chemistry: Secondary | ICD-10-CM | POA: Diagnosis not present

## 2015-02-04 DIAGNOSIS — Z6828 Body mass index (BMI) 28.0-28.9, adult: Secondary | ICD-10-CM | POA: Diagnosis not present

## 2015-03-20 DIAGNOSIS — F17210 Nicotine dependence, cigarettes, uncomplicated: Secondary | ICD-10-CM | POA: Diagnosis not present

## 2015-03-20 DIAGNOSIS — H902 Conductive hearing loss, unspecified: Secondary | ICD-10-CM | POA: Diagnosis not present

## 2015-03-20 DIAGNOSIS — J0191 Acute recurrent sinusitis, unspecified: Secondary | ICD-10-CM | POA: Diagnosis not present

## 2015-03-20 DIAGNOSIS — H6503 Acute serous otitis media, bilateral: Secondary | ICD-10-CM | POA: Diagnosis not present

## 2015-03-20 DIAGNOSIS — H6983 Other specified disorders of Eustachian tube, bilateral: Secondary | ICD-10-CM | POA: Diagnosis not present

## 2015-04-03 ENCOUNTER — Other Ambulatory Visit: Payer: Self-pay | Admitting: Nurse Practitioner

## 2015-04-14 ENCOUNTER — Other Ambulatory Visit: Payer: Self-pay | Admitting: Nurse Practitioner

## 2015-04-15 ENCOUNTER — Other Ambulatory Visit: Payer: Self-pay | Admitting: Nurse Practitioner

## 2015-04-17 DIAGNOSIS — H6983 Other specified disorders of Eustachian tube, bilateral: Secondary | ICD-10-CM | POA: Diagnosis not present

## 2015-04-17 DIAGNOSIS — H6503 Acute serous otitis media, bilateral: Secondary | ICD-10-CM | POA: Diagnosis not present

## 2015-06-08 ENCOUNTER — Ambulatory Visit (INDEPENDENT_AMBULATORY_CARE_PROVIDER_SITE_OTHER): Payer: Medicare Other | Admitting: Internal Medicine

## 2015-06-08 ENCOUNTER — Encounter: Payer: Self-pay | Admitting: Internal Medicine

## 2015-06-08 VITALS — BP 130/84 | HR 81 | Temp 98.3°F | Resp 12 | Ht 63.25 in | Wt 160.4 lb

## 2015-06-08 DIAGNOSIS — M25512 Pain in left shoulder: Secondary | ICD-10-CM

## 2015-06-08 DIAGNOSIS — Z716 Tobacco abuse counseling: Secondary | ICD-10-CM | POA: Insufficient documentation

## 2015-06-08 DIAGNOSIS — M25519 Pain in unspecified shoulder: Secondary | ICD-10-CM | POA: Insufficient documentation

## 2015-06-08 DIAGNOSIS — N644 Mastodynia: Secondary | ICD-10-CM

## 2015-06-08 DIAGNOSIS — Z720 Tobacco use: Secondary | ICD-10-CM

## 2015-06-08 DIAGNOSIS — Z87891 Personal history of nicotine dependence: Secondary | ICD-10-CM | POA: Insufficient documentation

## 2015-06-08 MED ORDER — PREDNISONE 10 MG PO TABS: ORAL_TABLET | ORAL | Status: DC

## 2015-06-08 MED ORDER — TIZANIDINE HCL 2 MG PO TABS: 2.0000 mg | ORAL_TABLET | Freq: Three times a day (TID) | ORAL | Status: DC

## 2015-06-08 NOTE — Progress Notes (Signed)
Pre-visit discussion using our clinic review tool. No additional management support is needed unless otherwise documented below in the visit note.  

## 2015-06-08 NOTE — Assessment & Plan Note (Signed)
Unclear without imaging if she is suffering form adhesive capsulitis or rotator cuff syndrome.  She has refused steroid injection.  Will treat with prednisone taper, MT and ROM exercises.  Sports Med referral if no improvement in 4 weeks.

## 2015-06-08 NOTE — Patient Instructions (Signed)
Rotator cuff syndrome suspected as the cause of your left shoulder pain   Prednisone taper for 6 days,  Tizanidine muscle relaxer as needed.  You can Use sling to rest arm but do the wall and circular  exercises 3 times daily to prevent a frozen shoulder  You an take up to 2000 mg of tylenol alon gwith the prednisone ,  But no Aleve or Motrin   If no improvement in 2-4 weeks,   Call for sport medicine referral    2) I am sedning you to o Dr Bary Castilla for ultrasound/examination of left breast   3) Please try to reduce your cigarette use every week by one cigarette daily

## 2015-06-08 NOTE — Assessment & Plan Note (Addendum)
She has a solid mass vs possible pectoralis muscle spasm.  Sending to [facility] for ultrasound.  Last mammogram was normal Jan 2016 and in chart

## 2015-06-08 NOTE — Progress Notes (Signed)
Subjective:  Patient ID: Kayla Farmer, female    DOB: October 31, 1965  Age: 49 y.o. MRN: [id number]  CC: The primary encounter diagnosis was Tobacco abuse. Diagnoses of Tobacco abuse counseling, Breast pain, left, and Pain in joint, shoulder region, left were also pertinent to this visit.  HPI Kayla Farmer presents for left shoulder pain . Started several months ago .  O history of fall or overuse.  Hurts with flexing shoulder above 180 degrees,  abductipn above 140, and no internal rotation due to pain. Pain is on chest wall ,  Axillary area, and over scapula. Does not hurt if arm is flexed and elbow and  abducted in front of her .Feels she has a Lump on left axillary area,  Tender and "pulls" if she moves a certain way.    annual exam Jan 2016 by Lorane Gell and mammogram normal at that time.   Still smoking, , has cut back to 18 cigs daily .  OCPS ill advised but they do not come me.    Outpatient Prescriptions Prior to Visit  Medication Sig Dispense Refill  . busPIRone (BUSPAR) 30 MG tablet take 1 tablet by mouth once daily 30 tablet 5  . omeprazole (PRILOSEC) 40 MG capsule take 1 capsule by mouth once daily 30 capsule 5  . Estradiol-Norethindrone Acet 0.5-0.1 MG per tablet Take 1 tablet by mouth daily.     No facility-administered medications prior to visit.    Review of Systems;  Patient denies headache, fevers, malaise, unintentional weight loss, skin rash, eye pain, sinus congestion and sinus pain, sore throat, dysphagia,  hemoptysis , cough, dyspnea, wheezing, chest pain, palpitations, orthopnea, edema, abdominal pain, nausea, melena, diarrhea, constipation, flank pain, dysuria, hematuria, urinary  Frequency, nocturia, numbness, tingling, seizures,  Focal weakness, Loss of consciousness,  Tremor, insomnia, depression, anxiety, and suicidal ideation.      Objective:  BP 130/84 mmHg  Pulse 81  Temp(Src) 98.3 F (36.8 C) (Oral)  Resp 12  Ht 5' 3.25" (1.607 m)  Wt 160 lb 6 oz (72.746  kg)  BMI 28.17 kg/m2  SpO2 100%  LMP 10/10/2014 (Approximate)  BP Readings from Last 3 Encounters:  06/08/15 130/84  09/18/14 110/66  10/24/13 120/74    Wt Readings from Last 3 Encounters:  06/08/15 160 lb 6 oz (72.746 kg)  09/18/14 163 lb 1.9 oz (73.991 kg)  10/24/13 159 lb 12.8 oz (72.485 kg)    General appearance: alert, cooperative and appears stated age Ears: normal TM's and external ear canals both ears Throat: lips, mucosa, and tongue normal; teeth and gums normal Neck: no adenopathy, no carotid bruit, supple, symmetrical, trachea midline and thyroid not enlarged, symmetric, no tenderness/mass/nodules Back: symmetric, no curvature. ROM normal. No CVA tenderness. Lungs: clear to auscultation bilaterally Heart: regular rate and rhythm, S1, S2 normal, no murmur, click, rub or gallop Abdomen: soft, non-tender; bowel sounds normal; no masses,  no organomegaly Pulses: 2+ and symmetric Skin: Skin color, texture, turgor normal. No rashes or lesions Lymph nodes: Cervical, supraclavicular, and axillary nodes normal.  Lab Results  Component Value Date   HGBA1C 6.0 09/19/2014    Lab Results  Component Value Date   CREATININE 0.9 05/28/2013   CREATININE 0.9 05/10/2011    Lab Results  Component Value Date   WBC 8.5 05/28/2013   HGB 12.6 05/28/2013   HCT 38.3 05/28/2013   PLT 175.0 05/28/2013   GLUCOSE 90 05/28/2013   CHOL 220* 09/19/2014   TRIG 113.0 09/19/2014   HDL  53.10 09/19/2014   LDLCALC 144* 09/19/2014   ALT 9 05/28/2013   AST 17 05/28/2013   NA 137 05/28/2013   K 4.4 05/28/2013   CL 108 05/28/2013   CREATININE 0.9 05/28/2013   BUN 16 05/28/2013   CO2 23 05/28/2013   TSH 0.65 05/28/2013   HGBA1C 6.0 09/19/2014    No results found.  Assessment & Plan:   Problem List Items Addressed This Visit    Tobacco abuse - Primary   Tobacco abuse counseling   Breast pain, left    She has a solid mass vs possible pectoralis muscle spasm.  Sending to [facility] for ultrasound.  Last mammogram was normal Jan 2016 and in chart      Relevant Orders   Ambulatory referral to General Surgery   Pain in joint, shoulder region    Unclear without imaging if she is suffering form adhesive capsulitis or rotator cuff syndrome.  She has refused steroid injection.  Will treat with prednisone taper, MT and ROM exercises.  Sports Med referral if no improvement in 4 weeks.          I am having Ms. Kayla Farmer start on predniSONE and tiZANidine. I am also having her maintain her Estradiol-Norethindrone Acet, omeprazole, busPIRone, and venlafaxine XR.  Meds ordered this encounter  Medications  . venlafaxine XR (EFFEXOR-XR) 75 MG 24 hr capsule    Sig: Take 75 mg by mouth daily.    Refill:  0  . predniSONE (DELTASONE) 10 MG tablet    Sig: 6 tablets on Day 1 , then reduce by 1 tablet daily until gone    Dispense:  21 tablet    Refill:  0  . tiZANidine (ZANAFLEX) 2 MG tablet    Sig: Take 1 tablet (2 mg total) by mouth 3 (three) times daily. As needed for muscle spasm    Dispense:  30 tablet    Refill:  0    There are no discontinued medications.  Follow-up: No Follow-up on file.   [name], MD

## 2015-06-11 ENCOUNTER — Encounter: Payer: Self-pay | Admitting: *Deleted

## 2015-06-23 ENCOUNTER — Encounter: Payer: Self-pay | Admitting: General Surgery

## 2015-06-24 ENCOUNTER — Encounter: Payer: Self-pay | Admitting: General Surgery

## 2015-06-24 ENCOUNTER — Ambulatory Visit (INDEPENDENT_AMBULATORY_CARE_PROVIDER_SITE_OTHER): Payer: Medicare Other | Admitting: General Surgery

## 2015-06-24 VITALS — BP 108/64 | HR 70 | Resp 12 | Ht 63.0 in | Wt 167.0 lb

## 2015-06-24 DIAGNOSIS — M25512 Pain in left shoulder: Secondary | ICD-10-CM | POA: Insufficient documentation

## 2015-06-24 NOTE — Patient Instructions (Signed)
Continue to monitor the area, she will call for new changes. Continue self breast exams. Call office for any new breast issues or concerns.

## 2015-06-24 NOTE — Progress Notes (Signed)
Patient ID: Kayla Farmer, female   DOB: 07-16-1966, 49 y.o.   MRN: [id number]  Chief Complaint  Patient presents with  . Breast Problem    HPI Miniya Farmer is a 49 y.o. female.  who presents for a breast evaluation. The most recent mammogram was done on 09-19-14.  Patient does perform regular self breast checks and gets regular mammograms done.   She felt like she could feel a spot "marble" in the upper left breast a couple weeks ago but she can't feel it any more. She states the left breast is "sore" feeling. She does admit to having soreness under her left bra strap for 1-2 months. She states Dr. Derrel Nip could feel something around her nipple area. She has had trouble with her left shoulder/rotator cuff for 2 1/2 months and she finished her prednisone last week. Denies any breast injury or trauma. Denies any family history of breast or colon cancer.  HPI  Past Medical History  Diagnosis Date  . Other inflammations of eyelids   . History of cervical cancer   . GERD (gastroesophageal reflux disease)   . Arthritis   . Sinus infection     No past surgical history on file.  Family History  Problem Relation Age of Onset  . Coronary artery disease Mother   . Coronary artery disease Father     smoker    Social History Social History  Substance Use Topics  . Smoking status: Current Every Day Smoker -- 1.00 packs/day for 30 years    Types: Cigarettes  . Smokeless tobacco: Never Used     Comment: smokes one pack of cigarettes per day since age 89, quit during pregnancies.  . Alcohol Use: No    Allergies  Allergen Reactions  . Codeine Itching    Current Outpatient Prescriptions  Medication Sig Dispense Refill  . busPIRone (BUSPAR) 30 MG tablet take 1 tablet by mouth once daily 30 tablet 5  . omeprazole (PRILOSEC) 40 MG capsule take 1 capsule by mouth once daily 30 capsule 5  . venlafaxine XR (EFFEXOR-XR) 75 MG 24 hr capsule Take 75 mg by mouth daily.  0   No current  facility-administered medications for this visit.    Review of Systems Review of Systems  Constitutional: Negative.   Respiratory: Negative.   Cardiovascular: Negative.     Blood pressure 108/64, pulse 70, resp. rate 12, height 5' 3"  (1.6 m), weight 167 lb (75.751 kg), last menstrual period 10/10/2014.  Physical Exam Physical Exam  Constitutional: She is oriented to person, place, and time. She appears well-developed and well-nourished.  HENT:  Mouth/Throat: Oropharynx is clear and moist.  Eyes: Conjunctivae are normal. No scleral icterus.  Neck: Neck supple.  Cardiovascular: Normal rate, regular rhythm and normal heart sounds.   Pulmonary/Chest: Effort normal and breath sounds normal. Right breast exhibits no inverted nipple, no mass, no nipple discharge, no skin change and no tenderness. Left breast exhibits no inverted nipple, no mass, no nipple discharge, no skin change and no tenderness.    Left muscular tenderness noted.  Abdominal: Soft.  Diastasis recti present.  Lymphadenopathy:    She has no cervical adenopathy.    She has no axillary adenopathy.  Neurological: She is alert and oriented to person, place, and time.  Skin: Skin is warm and dry.  Psychiatric: Her behavior is normal.    Data Reviewed Bilateral screening mammogram dated 09/19/2014 were independently reviewed. No asymmetric breast densities or 6 wishes microcalcifications. BI-RADS-1.  Assessment    Benign breast exam.    Plan        Continue to monitor the area, she will call for new changes. Continue self breast exams. Call office for any new breast issues or concerns.    PCP:  Mattie Marlin 06/24/2015, 9:05 PM

## 2015-09-21 ENCOUNTER — Encounter: Payer: Medicare Other | Admitting: Internal Medicine

## 2015-09-25 ENCOUNTER — Encounter: Payer: Self-pay | Admitting: Internal Medicine

## 2015-09-25 ENCOUNTER — Ambulatory Visit (INDEPENDENT_AMBULATORY_CARE_PROVIDER_SITE_OTHER): Payer: Medicare Other | Admitting: Internal Medicine

## 2015-09-25 VITALS — BP 124/76 | HR 79 | Temp 98.0°F | Resp 12 | Ht 63.0 in | Wt 172.0 lb

## 2015-09-25 DIAGNOSIS — K580 Irritable bowel syndrome with diarrhea: Secondary | ICD-10-CM

## 2015-09-25 DIAGNOSIS — D509 Iron deficiency anemia, unspecified: Secondary | ICD-10-CM

## 2015-09-25 DIAGNOSIS — Z1239 Encounter for other screening for malignant neoplasm of breast: Secondary | ICD-10-CM | POA: Diagnosis not present

## 2015-09-25 DIAGNOSIS — E559 Vitamin D deficiency, unspecified: Secondary | ICD-10-CM

## 2015-09-25 DIAGNOSIS — Z0000 Encounter for general adult medical examination without abnormal findings: Secondary | ICD-10-CM | POA: Diagnosis not present

## 2015-09-25 DIAGNOSIS — Z716 Tobacco abuse counseling: Secondary | ICD-10-CM

## 2015-09-25 DIAGNOSIS — E669 Obesity, unspecified: Secondary | ICD-10-CM | POA: Diagnosis not present

## 2015-09-25 DIAGNOSIS — R748 Abnormal levels of other serum enzymes: Secondary | ICD-10-CM

## 2015-09-25 DIAGNOSIS — R5383 Other fatigue: Secondary | ICD-10-CM

## 2015-09-25 DIAGNOSIS — Z1159 Encounter for screening for other viral diseases: Secondary | ICD-10-CM | POA: Diagnosis not present

## 2015-09-25 DIAGNOSIS — Z720 Tobacco use: Secondary | ICD-10-CM

## 2015-09-25 DIAGNOSIS — Z7289 Other problems related to lifestyle: Secondary | ICD-10-CM | POA: Diagnosis not present

## 2015-09-25 LAB — CBC WITH DIFFERENTIAL/PLATELET
BASOS ABS: 0.1 10*3/uL (ref 0.0–0.1)
Basophils Relative: 0.7 % (ref 0.0–3.0)
EOS ABS: 0.2 10*3/uL (ref 0.0–0.7)
Eosinophils Relative: 1.8 % (ref 0.0–5.0)
HEMATOCRIT: 40.7 % (ref 36.0–46.0)
HEMOGLOBIN: 13.3 g/dL (ref 12.0–15.0)
LYMPHS PCT: 33.3 % (ref 12.0–46.0)
Lymphs Abs: 3 10*3/uL (ref 0.7–4.0)
MCHC: 32.5 g/dL (ref 30.0–36.0)
MCV: 82.5 fl (ref 78.0–100.0)
Monocytes Absolute: 0.5 10*3/uL (ref 0.1–1.0)
Monocytes Relative: 5.7 % (ref 3.0–12.0)
Neutro Abs: 5.3 10*3/uL (ref 1.4–7.7)
Neutrophils Relative %: 58.5 % (ref 43.0–77.0)
PLATELETS: 237 10*3/uL (ref 150.0–400.0)
RBC: 4.94 Mil/uL (ref 3.87–5.11)
RDW: 15.3 % (ref 11.5–15.5)
WBC: 9 10*3/uL (ref 4.0–10.5)

## 2015-09-25 LAB — COMPREHENSIVE METABOLIC PANEL
ALBUMIN: 4.2 g/dL (ref 3.5–5.2)
ALK PHOS: 138 U/L — AB (ref 39–117)
ALT: 19 U/L (ref 0–35)
AST: 23 U/L (ref 0–37)
BILIRUBIN TOTAL: 0.3 mg/dL (ref 0.2–1.2)
BUN: 24 mg/dL — ABNORMAL HIGH (ref 6–23)
CALCIUM: 9.9 mg/dL (ref 8.4–10.5)
CHLORIDE: 104 meq/L (ref 96–112)
CO2: 29 mEq/L (ref 19–32)
Creatinine, Ser: 0.86 mg/dL (ref 0.40–1.20)
GFR: 73.72 mL/min (ref >60.00)
Glucose, Bld: 88 mg/dL (ref 70–99)
Potassium: 4.6 mEq/L (ref 3.5–5.1)
Sodium: 140 mEq/L (ref 135–145)
TOTAL PROTEIN: 7.7 g/dL (ref 6.0–8.3)

## 2015-09-25 LAB — LIPID PANEL
CHOL/HDL RATIO: 4
CHOLESTEROL: 230 mg/dL — AB (ref 0–200)
HDL: 62.3 mg/dL (ref >39.00)
LDL CALC: 150 mg/dL — AB (ref 0–99)
NonHDL: 167.65
TRIGLYCERIDES: 88 mg/dL (ref 0.0–149.0)
VLDL: 17.6 mg/dL (ref 0.0–40.0)

## 2015-09-25 LAB — FERRITIN: Ferritin: 15.6 ng/mL (ref 10.0–291.0)

## 2015-09-25 LAB — VITAMIN D 25 HYDROXY (VIT D DEFICIENCY, FRACTURES): VITD: 34.02 ng/mL (ref 30.00–100.00)

## 2015-09-25 LAB — IBC PANEL
Iron: 35 ug/dL — ABNORMAL LOW (ref 42–145)
Saturation Ratios: 8.3 % — ABNORMAL LOW (ref 20.0–50.0)
TRANSFERRIN: 301 mg/dL (ref 212.0–360.0)

## 2015-09-25 LAB — TSH: TSH: 1.47 u[IU]/mL (ref 0.35–4.50)

## 2015-09-25 MED ORDER — MELOXICAM 15 MG PO TABS: 15.0000 mg | ORAL_TABLET | Freq: Every day | ORAL | Status: DC

## 2015-09-25 NOTE — Progress Notes (Signed)
Patient ID: Kayla Farmer, female    DOB: 10-Apr-1966  Age: 50 y.o. MRN: [id number]  The patient is here for annual  wellness examination and management of other chronic and acute problems.    Last PAP  Was normal in   2015  Mammogram due  Refuses flu vaccine  Has gained weight, no longer walking daily  No prior colonoscopy , but insurance sent out a stool card that she completed last week.  No results yet.   The risk factors are reflected in the social history.   I spent 3 minutes discussing risk of continued tobacco abuse, including but not limited to CAD, PAD, hypertension, and CA.  He is not interested in pharmacotherapy at this time.  The roster of all physicians providing medical care to patient - is listed in the Snapshot section of the chart.  Activities of daily living:  The patient is 100% independent in all ADLs: dressing, toileting, feeding as well as independent mobility  Home safety : The patient has smoke detectors in the home. They wear seatbelts.  There are no firearms at home. There is no violence in the home.   There is no risks for hepatitis, STDs or HIV. There is no   history of blood transfusion. They have no travel history to infectious disease endemic areas of the world.  The patient has seen their dentist in the last six month. They have seen their eye doctor in the last year. They admit to slight hearing difficulty with regard to whispered voices and some television programs.  They have deferred audiologic testing in the last year.  They do not  have excessive sun exposure. Discussed the need for sun protection: hats, long sleeves and use of sunscreen if there is significant sun exposure.   Diet: the importance of a healthy diet is discussed. They do have a healthy diet.  The benefits of regular aerobic exercise were discussed. She walks 4 times per week ,  20 minutes.   Depression screen: there are no signs or vegative symptoms of depression- irritability, change in  appetite, anhedonia, sadness/tearfullness.  Cognitive assessment: the patient manages all their financial and personal affairs and is actively engaged. They could relate day,date,year and events; recalled 2/3 objects at 3 minutes; performed clock-face test normally.  The following portions of the patient's history were reviewed and updated as appropriate: allergies, current medications, past family history, past medical history,  past surgical history, past social history  and problem list.  Visual acuity was not assessed per patient preference since she has regular follow up with her ophthalmologist. Hearing and body mass index were assessed and reviewed.   During the course of the visit the patient was educated and counseled about appropriate screening and preventive services including : fall prevention , diabetes screening, nutrition counseling, colorectal cancer screening, and recommended immunizations.    CC: The primary encounter diagnosis was Breast cancer screening. Diagnoses of Irritable bowel syndrome with diarrhea, Tobacco abuse counseling, Tobacco abuse, Obesity, Vitamin D deficiency, Need for hepatitis C screening test, Other fatigue, Anemia, iron deficiency, Elevated alkaline phosphatase level, and Encounter for preventive health examination were also pertinent to this visit.  Left shoulder still probelmatic with all ROM  .  Goes  numb if she sleeps on it  Wants to see Kayla Farmer persistentt left ear effusion  Has been on prednisone twice by ENT  Kayla Farmer,  recurredn less than a month later  2nd round did not help,  tube offered,  Fingers 4 and 5 bilaterally of numb with usie of arms  Chronic Left wrist pain and recurrent numbness of 1, 4 and 5,  With use,  histor of CTS during pregnancy 13 yrs aogo,  Used braces.     Sleepy all the time.  Falls asleep if not moving.  Has interrupted s;eep .  3-5 hours,   husband says she snores now  bMI now 69.  Wakes up most days feeling refreshed  most days.   Can't  Thinks iron may be low.  No prior colonoscopy,  complted a ColoGuard last week,  Results still pending History Kayla Farmer has a past medical history of Other inflammations of eyelids; History of cervical cancer; GERD (gastroesophageal reflux disease); Arthritis; and Sinus infection.   She has no past surgical history on file.   Her family history includes Coronary artery disease in her father and mother.She reports that she has been smoking Cigarettes.  She has a 30 pack-year smoking history. She has never used smokeless tobacco. She reports that she does not drink alcohol or use illicit drugs.  Outpatient Prescriptions Prior to Visit  Medication Sig Dispense Refill  . busPIRone (BUSPAR) 30 MG tablet take 1 tablet by mouth once daily 30 tablet 5  . omeprazole (PRILOSEC) 40 MG capsule take 1 capsule by mouth once daily 30 capsule 5  . venlafaxine XR (EFFEXOR-XR) 75 MG 24 hr capsule Take 75 mg by mouth daily.  0   No facility-administered medications prior to visit.    Review of Systems   Patient denies headache, fevers, malaise, unintentional weight loss, skin rash, eye pain, sinus congestion and sinus pain, sore throat, dysphagia,  hemoptysis , cough, dyspnea, wheezing, chest pain, palpitations, orthopnea, edema, abdominal pain, nausea, melena, diarrhea, constipation, flank pain, dysuria, hematuria, urinary  Frequency, nocturia, numbness, tingling, seizures,  Focal weakness, Loss of consciousness,  Tremor, insomnia, depression, anxiety, and suicidal ideation.      Objective:  BP 124/76 mmHg  Pulse 79  Temp(Src) 98 F (36.7 C) (Oral)  Resp 12  Ht 5' 3"  (1.6 m)  Wt 172 lb (78.019 kg)  BMI 30.48 kg/m2  SpO2 96%  LMP 10/13/2014 (Approximate)  Physical Exam   General appearance: alert, cooperative and appears stated age Head: Normocephalic, without obvious abnormality, atraumatic Eyes: conjunctivae/corneas clear. PERRL, EOM's intact. Fundi benign. Ears: normal TM's and  external ear canals both ears Nose: Nares normal. Septum midline. Mucosa normal. No drainage or sinus tenderness. Throat: lips, mucosa, and tongue normal; teeth and gums normal Neck: no adenopathy, no carotid bruit, no JVD, supple, symmetrical, trachea midline and thyroid not enlarged, symmetric, no tenderness/mass/nodules Lungs: clear to auscultation bilaterally Breasts: normal appearance, no masses or tenderness Heart: regular rate and rhythm, S1, S2 normal, no murmur, click, rub or gallop Abdomen: soft, non-tender; bowel sounds normal; no masses,  no organomegaly Extremities: extremities normal, atraumatic, no cyanosis or edema Pulses: 2+ and symmetric Skin: Skin color, texture, turgor normal. No rashes or lesions Neurologic: Alert and oriented X 3, normal strength and tone. Normal symmetric reflexes. Normal coordination and gait.     Assessment & Plan:   Problem List Items Addressed This Visit    Tobacco abuse counseling    Smoking cessation instruction/counseling given:  counseled patient on the dangers of tobacco use, advised patient to stop smoking, and reviewed strategies to maximize success      Elevated alkaline phosphatase level    Still has a GB,  AST an dALT normal.  Ultrasound ordered  Relevant Orders   US Abdomen Complete   Anemia, iron deficiency    The anemia has resolved but iron stores are still low. She has heavy periods,  And a Home fecal occult test has been done but results are unknown. Will need iron supplementation and followup       Relevant Orders   CBC with Differential/Platelet (Completed)   Ferritin (Completed)   IBC panel (Completed)   Encounter for preventive health examination    Annual comprehensive preventive exam was done as well as an evaluation and management of chronic conditions .  During the course of the visit the patient was educated and counseled about appropriate screening and preventive services including :  diabetes screening,  lipid analysis with projected  10 year  risk for CAD  Which is 4.7 % using the Framingham risk calculator for women, , nutrition counseling, colorectal cancer screening, and recommended immunizations.  Printed recommendations for health maintenance screenings was given.    Lab Results  Component Value Date   CHOL 230* 09/25/2015   HDL 62.30 09/25/2015   LDLCALC 150* 09/25/2015   TRIG 88.0 09/25/2015   CHOLHDL 4 09/25/2015         Tobacco abuse   Irritable bowel syndrome    Other Visit Diagnoses    Breast cancer screening    -  Primary    Relevant Orders    MM DIGITAL SCREENING BILATERAL    Obesity        Relevant Orders    Comprehensive metabolic panel (Completed)    Lipid panel (Completed)    Vitamin D deficiency        Relevant Orders    VITAMIN D 25 Hydroxy (Vit-D Deficiency, Fractures) (Completed)    Need for hepatitis C screening test        Relevant Orders    Hepatitis C antibody (Completed)    Other fatigue        Relevant Orders    TSH (Completed)    CBC with Differential/Platelet (Completed)       I am having Ms. Danne Baxter start on meloxicam. I am also having her maintain her omeprazole, busPIRone, and venlafaxine XR.  Meds ordered this encounter  Medications  . meloxicam (MOBIC) 15 MG tablet    Sig: Take 1 tablet (15 mg total) by mouth daily.    Dispense:  90 tablet    Refill:  1    There are no discontinued medications.  Follow-up: No Follow-up on file.   [name], MD

## 2015-09-25 NOTE — Patient Instructions (Addendum)
Try taking a second dose of buspirone at 9 pm to see if it helps your sleep  I recommend a trial of 10 mg zyrtec (cetirizine) daily at bedtime to se if it helps dry up ear .  This medication is an anthistamine .  Less sedating than benadryl but still may be sedating  Referral to [facility] for shoulder.  Trial of Meloxiam (generic for Mobic).  Take the meloxicam once daily for 4 weeks.  DO NOT TAKE ibuprofen, motrin, aleve, naproxen, or any orther NSAID or aspirin product while you are on this except a baby aspirin (81 mg) if you are already doing so..  You CAN take tylenol ,  Up to 2000 mg daily.  This will help the pain  Your BMI is  NOW 30 (the cutoff for obesity, as classified by the American Heart Association).  I want to you lose 10% (17 lbs) over the next 6 to 12 months using a low glycemic index diet and regular exercise (30 minutes of continuous walking, cycling, etc. 5 days per week)   Menopause is a normal process in which your reproductive ability comes to an end. This process happens gradually over a span of months to years, usually between the ages of 66 and 39. Menopause is complete when you have missed 12 consecutive menstrual periods. It is important to talk with your health care provider about some of the most common conditions that affect postmenopausal women, such as heart disease, cancer, and bone loss (osteoporosis). Adopting a healthy lifestyle and getting preventive care can help to promote your health and wellness. Those actions can also lower your chances of developing some of these common conditions. WHAT SHOULD I KNOW ABOUT MENOPAUSE? During menopause, you may experience a number of symptoms, such as:  Moderate-to-severe hot flashes.  Night sweats.  Decrease in sex drive.  Mood swings.  Headaches.  Tiredness.  Irritability.  Memory problems.  Insomnia. Choosing to treat or not to treat menopausal changes is an individual decision that you make with your  health care provider. WHAT SHOULD I KNOW ABOUT HORMONE REPLACEMENT THERAPY AND SUPPLEMENTS? Hormone therapy products are effective for treating symptoms that are associated with menopause, such as hot flashes and night sweats. Hormone replacement carries certain risks, especially as you become older. If you are thinking about using estrogen or estrogen with progestin treatments, discuss the benefits and risks with your health care provider. WHAT SHOULD I KNOW ABOUT HEART DISEASE AND STROKE? Heart disease, heart attack, and stroke become more likely as you age. This may be due, in part, to the hormonal changes that your body experiences during menopause. These can affect how your body processes dietary fats, triglycerides, and cholesterol. Heart attack and stroke are both medical emergencies. There are many things that you can do to help prevent heart disease and stroke:  Have your blood pressure checked at least every 1-2 years. High blood pressure causes heart disease and increases the risk of stroke.  If you are 24-9 years old, ask your health care provider if you should take aspirin to prevent a heart attack or a stroke.  Do not use any tobacco products, including cigarettes, chewing tobacco, or electronic cigarettes. If you need help quitting, ask your health care provider.  It is important to eat a healthy diet and maintain a healthy weight.  Be sure to include plenty of vegetables, fruits, low-fat dairy products, and lean protein.  Avoid eating foods that are high in solid fats, added  sugars, or salt (sodium).  Get regular exercise. This is one of the most important things that you can do for your health.  Try to exercise for at least 150 minutes each week. The type of exercise that you do should increase your heart rate and make you sweat. This is known as moderate-intensity exercise.  Try to do strengthening exercises at least twice each week. Do these in addition to the  moderate-intensity exercise.  Know your numbers.Ask your health care provider to check your cholesterol and your blood glucose. Continue to have your blood tested as directed by your health care provider. WHAT SHOULD I KNOW ABOUT CANCER SCREENING? There are several types of cancer. Take the following steps to reduce your risk and to catch any cancer development as early as possible. Breast Cancer  Practice breast self-awareness.  This means understanding how your breasts normally appear and feel.  It also means doing regular breast self-exams. Let your health care provider know about any changes, no matter how small.  If you are 76 or older, have a clinician do a breast exam (clinical breast exam or CBE) every year. Depending on your age, family history, and medical history, it may be recommended that you also have a yearly breast X-ray (mammogram).  If you have a family history of breast cancer, talk with your health care provider about genetic screening.  If you are at high risk for breast cancer, talk with your health care provider about having an MRI and a mammogram every year.  Breast cancer (BRCA) gene test is recommended for women who have family members with BRCA-related cancers. Results of the assessment will determine the need for genetic counseling and BRCA1 and for BRCA2 testing. BRCA-related cancers include these types:  Breast. This occurs in males or females.  Ovarian.  Tubal. This may also be called fallopian tube cancer.  Cancer of the abdominal or pelvic lining (peritoneal cancer).  Prostate.  Pancreatic. Cervical, Uterine, and Ovarian Cancer Your health care provider may recommend that you be screened regularly for cancer of the pelvic organs. These include your ovaries, uterus, and vagina. This screening involves a pelvic exam, which includes checking for microscopic changes to the surface of your cervix (Pap test).  For women ages 21-65, health care providers  may recommend a pelvic exam and a Pap test every three years. For women ages 42-65, they may recommend the Pap test and pelvic exam, combined with testing for human papilloma virus (HPV), every five years. Some types of HPV increase your risk of cervical cancer. Testing for HPV may also be done on women of any age who have unclear Pap test results.  Other health care providers may not recommend any screening for nonpregnant women who are considered low risk for pelvic cancer and have no symptoms. Ask your health care provider if a screening pelvic exam is right for you.  If you have had past treatment for cervical cancer or a condition that could lead to cancer, you need Pap tests and screening for cancer for at least 20 years after your treatment. If Pap tests have been discontinued for you, your risk factors (such as having a new sexual partner) need to be reassessed to determine if you should start having screenings again. Some women have medical problems that increase the chance of getting cervical cancer. In these cases, your health care provider may recommend that you have screening and Pap tests more often.  If you have a family history of uterine  cancer or ovarian cancer, talk with your health care provider about genetic screening.  If you have vaginal bleeding after reaching menopause, tell your health care provider.  There are currently no reliable tests available to screen for ovarian cancer. Lung Cancer Lung cancer screening is recommended for adults 59-38 years old who are at high risk for lung cancer because of a history of smoking. A yearly low-dose CT scan of the lungs is recommended if you:  Currently smoke.  Have a history of at least 30 pack-years of smoking and you currently smoke or have quit within the past 15 years. A pack-year is smoking an average of one pack of cigarettes per day for one year. Yearly screening should:  Continue until it has been 15 years since you  quit.  Stop if you develop a health problem that would prevent you from having lung cancer treatment. Colorectal Cancer  This type of cancer can be detected and can often be prevented.  Routine colorectal cancer screening usually begins at age 8 and continues through age 28.  If you have risk factors for colon cancer, your health care provider may recommend that you be screened at an earlier age.  If you have a family history of colorectal cancer, talk with your health care provider about genetic screening.  Your health care provider may also recommend using home test kits to check for hidden blood in your stool.  A small camera at the end of a tube can be used to examine your colon directly (sigmoidoscopy or colonoscopy). This is done to check for the earliest forms of colorectal cancer.  Direct examination of the colon should be repeated every 5-10 years until age 49. However, if early forms of precancerous polyps or small growths are found or if you have a family history or genetic risk for colorectal cancer, you may need to be screened more often. Skin Cancer  Check your skin from head to toe regularly.  Monitor any moles. Be sure to tell your health care provider:  About any new moles or changes in moles, especially if there is a change in a mole's shape or color.  If you have a mole that is larger than the size of a pencil eraser.  If any of your family members has a history of skin cancer, especially at a young age, talk with your health care provider about genetic screening.  Always use sunscreen. Apply sunscreen liberally and repeatedly throughout the day.  Whenever you are outside, protect yourself by wearing long sleeves, pants, a wide-brimmed hat, and sunglasses. WHAT SHOULD I KNOW ABOUT OSTEOPOROSIS? Osteoporosis is a condition in which bone destruction happens more quickly than new bone creation. After menopause, you may be at an increased risk for osteoporosis. To  help prevent osteoporosis or the bone fractures that can happen because of osteoporosis, the following is recommended:  If you are 70-66 years old, get at least 1,000 mg of calcium and at least 600 mg of vitamin D per day.  If you are older than age 40 but younger than age 83, get at least 1,200 mg of calcium and at least 600 mg of vitamin D per day.  If you are older than age 28, get at least 1,200 mg of calcium and at least 800 mg of vitamin D per day. Smoking and excessive alcohol intake increase the risk of osteoporosis. Eat foods that are rich in calcium and vitamin D, and do weight-bearing exercises several times each week as  directed by your health care provider. WHAT SHOULD I KNOW ABOUT HOW MENOPAUSE AFFECTS [place]? Depression may occur at any age, but it is more common as you become older. Common symptoms of depression include:  Low or sad mood.  Changes in sleep patterns.  Changes in appetite or eating patterns.  Feeling an overall lack of motivation or enjoyment of activities that you previously enjoyed.  Frequent crying spells. Talk with your health care provider if you think that you are experiencing depression. WHAT SHOULD I KNOW ABOUT IMMUNIZATIONS? It is important that you get and maintain your immunizations. These include:  Tetanus, diphtheria, and pertussis (Tdap) booster vaccine.  Influenza every year before the flu season begins.  Pneumonia vaccine.  Shingles vaccine. Your health care provider may also recommend other immunizations.   This information is not intended to replace advice given to you by your health care provider. Make sure you discuss any questions you have with your health care provider.   Document Released: 10/21/2005 Document Revised: 09/19/2014 Document Reviewed: 05/01/2014 Elsevier Interactive Patient Education [employer].

## 2015-09-25 NOTE — Progress Notes (Signed)
Pre-visit discussion using our clinic review tool. No additional management support is needed unless otherwise documented below in the visit note.  

## 2015-09-26 LAB — HEPATITIS C ANTIBODY: HCV Ab: NEGATIVE

## 2015-09-27 DIAGNOSIS — D509 Iron deficiency anemia, unspecified: Secondary | ICD-10-CM | POA: Insufficient documentation

## 2015-09-27 DIAGNOSIS — K760 Fatty (change of) liver, not elsewhere classified: Secondary | ICD-10-CM | POA: Insufficient documentation

## 2015-09-27 DIAGNOSIS — Z0000 Encounter for general adult medical examination without abnormal findings: Secondary | ICD-10-CM | POA: Insufficient documentation

## 2015-09-27 NOTE — Assessment & Plan Note (Signed)
Still has a GB,  AST an dALT normal.  Ultrasound ordered

## 2015-09-27 NOTE — Assessment & Plan Note (Signed)
The anemia has resolved but iron stores are still low. She has heavy periods,  And a Home fecal occult test has been done but results are unknown. Will need iron supplementation and followup

## 2015-09-27 NOTE — Assessment & Plan Note (Addendum)
Annual comprehensive preventive exam was done as well as an evaluation and management of chronic conditions .  During the course of the visit the patient was educated and counseled about appropriate screening and preventive services including :  diabetes screening, lipid analysis with projected  10 year  risk for CAD  Which is 4.7 % using the Framingham risk calculator for women, , nutrition counseling, colorectal cancer screening, and recommended immunizations.  Printed recommendations for health maintenance screenings was given.    Lab Results  Component Value Date   CHOL 230* 09/25/2015   HDL 62.30 09/25/2015   LDLCALC 150* 09/25/2015   TRIG 88.0 09/25/2015   CHOLHDL 4 09/25/2015

## 2015-09-27 NOTE — Addendum Note (Signed)
Addended by: [name] on: 09/27/2015 09:12 PM   Modules accepted: Miquel Dunn

## 2015-09-27 NOTE — Assessment & Plan Note (Signed)
Smoking cessation instruction/counseling given:  counseled patient on the dangers of tobacco use, advised patient to stop smoking, and reviewed strategies to maximize success 

## 2015-09-29 ENCOUNTER — Telehealth: Payer: Self-pay | Admitting: Internal Medicine

## 2015-09-29 MED ORDER — FERROUS FUM-IRON POLYSACCH 162-115.2 MG PO CAPS
1.0000 | ORAL_CAPSULE | Freq: Every day | ORAL | Status: DC
Start: 2015-09-29 — End: 2016-11-10

## 2015-09-29 NOTE — Telephone Encounter (Signed)
RX SENT

## 2015-09-29 NOTE — Telephone Encounter (Signed)
Pt is requesting a rx for iron. She states that she would rather have a rx than getting something over the counter. [facility] [address]  Please advise.

## 2015-09-29 NOTE — Telephone Encounter (Signed)
Please advise for Iron? thanks

## 2015-09-30 ENCOUNTER — Other Ambulatory Visit: Payer: Self-pay | Admitting: Nurse Practitioner

## 2015-10-02 ENCOUNTER — Ambulatory Visit
Admission: RE | Admit: 2015-10-02 | Discharge: 2015-10-02 | Disposition: A | Discharge status: Home / Self Care | Payer: Medicare Other | Source: Ambulatory Visit | Attending: Internal Medicine | Admitting: Internal Medicine

## 2015-10-02 DIAGNOSIS — R748 Abnormal levels of other serum enzymes: Secondary | ICD-10-CM | POA: Insufficient documentation

## 2015-10-07 ENCOUNTER — Other Ambulatory Visit: Payer: Self-pay | Admitting: Internal Medicine

## 2015-10-07 ENCOUNTER — Ambulatory Visit
Admission: RE | Admit: 2015-10-07 | Discharge: 2015-10-07 | Disposition: A | Discharge status: Home / Self Care | Payer: Medicare Other | Source: Ambulatory Visit | Attending: Internal Medicine | Admitting: Internal Medicine

## 2015-10-07 DIAGNOSIS — Z1231 Encounter for screening mammogram for malignant neoplasm of breast: Secondary | ICD-10-CM | POA: Diagnosis not present

## 2015-10-07 DIAGNOSIS — Z1239 Encounter for other screening for malignant neoplasm of breast: Secondary | ICD-10-CM

## 2015-10-07 DIAGNOSIS — R928 Other abnormal and inconclusive findings on diagnostic imaging of breast: Secondary | ICD-10-CM

## 2015-10-08 ENCOUNTER — Other Ambulatory Visit: Payer: Self-pay | Admitting: Internal Medicine

## 2015-10-08 DIAGNOSIS — R928 Other abnormal and inconclusive findings on diagnostic imaging of breast: Secondary | ICD-10-CM

## 2015-10-22 ENCOUNTER — Ambulatory Visit
Admission: RE | Admit: 2015-10-22 | Discharge: 2015-10-22 | Disposition: A | Discharge status: Home / Self Care | Payer: Medicare Other | Source: Ambulatory Visit | Attending: Internal Medicine | Admitting: Internal Medicine

## 2015-10-22 DIAGNOSIS — N6001 Solitary cyst of right breast: Secondary | ICD-10-CM | POA: Insufficient documentation

## 2015-10-22 DIAGNOSIS — N63 Unspecified lump in breast: Secondary | ICD-10-CM | POA: Diagnosis not present

## 2015-10-22 DIAGNOSIS — R928 Other abnormal and inconclusive findings on diagnostic imaging of breast: Secondary | ICD-10-CM

## 2015-10-22 DIAGNOSIS — N6002 Solitary cyst of left breast: Secondary | ICD-10-CM | POA: Diagnosis not present

## 2015-12-30 DIAGNOSIS — N951 Menopausal and female climacteric states: Secondary | ICD-10-CM | POA: Diagnosis not present

## 2015-12-30 DIAGNOSIS — E669 Obesity, unspecified: Secondary | ICD-10-CM | POA: Diagnosis not present

## 2015-12-30 DIAGNOSIS — Z6831 Body mass index (BMI) 31.0-31.9, adult: Secondary | ICD-10-CM | POA: Diagnosis not present

## 2015-12-30 DIAGNOSIS — F3340 Major depressive disorder, recurrent, in remission, unspecified: Secondary | ICD-10-CM | POA: Diagnosis not present

## 2016-01-27 DIAGNOSIS — E663 Overweight: Secondary | ICD-10-CM | POA: Diagnosis not present

## 2016-01-27 DIAGNOSIS — Z6829 Body mass index (BMI) 29.0-29.9, adult: Secondary | ICD-10-CM | POA: Diagnosis not present

## 2016-02-24 DIAGNOSIS — E663 Overweight: Secondary | ICD-10-CM | POA: Diagnosis not present

## 2016-02-24 DIAGNOSIS — Z6829 Body mass index (BMI) 29.0-29.9, adult: Secondary | ICD-10-CM | POA: Diagnosis not present

## 2016-04-01 DIAGNOSIS — Z6829 Body mass index (BMI) 29.0-29.9, adult: Secondary | ICD-10-CM | POA: Diagnosis not present

## 2016-04-01 DIAGNOSIS — E663 Overweight: Secondary | ICD-10-CM | POA: Diagnosis not present

## 2016-04-02 ENCOUNTER — Other Ambulatory Visit: Payer: Self-pay | Admitting: Nurse Practitioner

## 2016-09-20 ENCOUNTER — Other Ambulatory Visit: Payer: Self-pay | Admitting: Internal Medicine

## 2016-09-20 DIAGNOSIS — Z1231 Encounter for screening mammogram for malignant neoplasm of breast: Secondary | ICD-10-CM

## 2016-09-29 ENCOUNTER — Other Ambulatory Visit: Payer: Self-pay | Admitting: Internal Medicine

## 2016-10-04 ENCOUNTER — Telehealth: Payer: Self-pay | Admitting: Internal Medicine

## 2016-10-04 NOTE — Telephone Encounter (Signed)
Pt is requesting refills on omeprazole (PRILOSEC) 40 MG capsule and busPIRone (BUSPAR) 30 MG tablet. She has a appointment with Dr. Derrel Nip on 11/10/16. Pt's phone number is [phone].

## 2016-10-07 NOTE — Telephone Encounter (Signed)
Error, Medication have been refilled

## 2016-10-12 ENCOUNTER — Ambulatory Visit
Admission: RE | Admit: 2016-10-12 | Discharge: 2016-10-12 | Disposition: A | Discharge status: Home / Self Care | Payer: Medicare Other | Source: Ambulatory Visit | Attending: Internal Medicine | Admitting: Internal Medicine

## 2016-10-12 DIAGNOSIS — Z1231 Encounter for screening mammogram for malignant neoplasm of breast: Secondary | ICD-10-CM | POA: Diagnosis not present

## 2016-11-10 ENCOUNTER — Other Ambulatory Visit (HOSPITAL_COMMUNITY)
Admission: RE | Admit: 2016-11-10 | Discharge: 2016-11-10 | Disposition: A | Discharge status: Home / Self Care | Payer: Medicare Other | Source: Ambulatory Visit | Attending: Internal Medicine | Admitting: Internal Medicine

## 2016-11-10 ENCOUNTER — Ambulatory Visit (INDEPENDENT_AMBULATORY_CARE_PROVIDER_SITE_OTHER): Payer: Medicare Other

## 2016-11-10 ENCOUNTER — Encounter: Payer: Self-pay | Admitting: Internal Medicine

## 2016-11-10 ENCOUNTER — Ambulatory Visit (INDEPENDENT_AMBULATORY_CARE_PROVIDER_SITE_OTHER): Payer: Medicare Other | Admitting: Internal Medicine

## 2016-11-10 VITALS — BP 130/88 | HR 86 | Resp 16 | Ht 63.0 in | Wt 182.0 lb

## 2016-11-10 DIAGNOSIS — R7303 Prediabetes: Secondary | ICD-10-CM

## 2016-11-10 DIAGNOSIS — Z01419 Encounter for gynecological examination (general) (routine) without abnormal findings: Secondary | ICD-10-CM | POA: Diagnosis not present

## 2016-11-10 DIAGNOSIS — M5412 Radiculopathy, cervical region: Secondary | ICD-10-CM | POA: Diagnosis not present

## 2016-11-10 DIAGNOSIS — Z0000 Encounter for general adult medical examination without abnormal findings: Secondary | ICD-10-CM | POA: Diagnosis not present

## 2016-11-10 DIAGNOSIS — Z716 Tobacco abuse counseling: Secondary | ICD-10-CM | POA: Diagnosis not present

## 2016-11-10 DIAGNOSIS — M25511 Pain in right shoulder: Secondary | ICD-10-CM | POA: Diagnosis not present

## 2016-11-10 DIAGNOSIS — Z124 Encounter for screening for malignant neoplasm of cervix: Secondary | ICD-10-CM

## 2016-11-10 DIAGNOSIS — R5383 Other fatigue: Secondary | ICD-10-CM

## 2016-11-10 DIAGNOSIS — Z720 Tobacco use: Secondary | ICD-10-CM

## 2016-11-10 DIAGNOSIS — E669 Obesity, unspecified: Secondary | ICD-10-CM | POA: Diagnosis not present

## 2016-11-10 DIAGNOSIS — E7800 Pure hypercholesterolemia, unspecified: Secondary | ICD-10-CM | POA: Diagnosis not present

## 2016-11-10 DIAGNOSIS — E559 Vitamin D deficiency, unspecified: Secondary | ICD-10-CM

## 2016-11-10 DIAGNOSIS — G8929 Other chronic pain: Secondary | ICD-10-CM

## 2016-11-10 DIAGNOSIS — F411 Generalized anxiety disorder: Secondary | ICD-10-CM

## 2016-11-10 DIAGNOSIS — M542 Cervicalgia: Secondary | ICD-10-CM | POA: Diagnosis not present

## 2016-11-10 DIAGNOSIS — Z1151 Encounter for screening for human papillomavirus (HPV): Secondary | ICD-10-CM | POA: Insufficient documentation

## 2016-11-10 LAB — CBC WITH DIFFERENTIAL/PLATELET
BASOS ABS: 0.1 10*3/uL (ref 0.0–0.1)
Basophils Relative: 1.1 % (ref 0.0–3.0)
EOS ABS: 0.1 10*3/uL (ref 0.0–0.7)
Eosinophils Relative: 1.4 % (ref 0.0–5.0)
HCT: 38.8 % (ref 36.0–46.0)
HEMOGLOBIN: 12.7 g/dL (ref 12.0–15.0)
LYMPHS ABS: 2.6 10*3/uL (ref 0.7–4.0)
Lymphocytes Relative: 30.2 % (ref 12.0–46.0)
MCHC: 32.6 g/dL (ref 30.0–36.0)
MCV: 83.3 fl (ref 78.0–100.0)
MONO ABS: 0.5 10*3/uL (ref 0.1–1.0)
Monocytes Relative: 5.9 % (ref 3.0–12.0)
NEUTROS PCT: 61.4 % (ref 43.0–77.0)
Neutro Abs: 5.3 10*3/uL (ref 1.4–7.7)
Platelets: 261 10*3/uL (ref 150.0–400.0)
RBC: 4.66 Mil/uL (ref 3.87–5.11)
RDW: 14.4 % (ref 11.5–15.5)
WBC: 8.7 10*3/uL (ref 4.0–10.5)

## 2016-11-10 LAB — COMPREHENSIVE METABOLIC PANEL
ALBUMIN: 4 g/dL (ref 3.5–5.2)
ALK PHOS: 160 U/L — AB (ref 39–117)
ALT: 18 U/L (ref 0–35)
AST: 18 U/L (ref 0–37)
BILIRUBIN TOTAL: 0.4 mg/dL (ref 0.2–1.2)
BUN: 18 mg/dL (ref 6–23)
CALCIUM: 9.5 mg/dL (ref 8.4–10.5)
CO2: 29 mEq/L (ref 19–32)
CREATININE: 0.84 mg/dL (ref 0.40–1.20)
Chloride: 105 mEq/L (ref 96–112)
GFR: 75.42 mL/min (ref >60.00)
Glucose, Bld: 91 mg/dL (ref 70–99)
Potassium: 4.5 mEq/L (ref 3.5–5.1)
Sodium: 138 mEq/L (ref 135–145)
Total Protein: 6.8 g/dL (ref 6.0–8.3)

## 2016-11-10 LAB — LIPID PANEL
CHOLESTEROL: 250 mg/dL — AB (ref 0–200)
HDL: 62.7 mg/dL (ref >39.00)
LDL Cholesterol: 161 mg/dL — ABNORMAL HIGH (ref 0–99)
NonHDL: 187.42
TRIGLYCERIDES: 131 mg/dL (ref 0.0–149.0)
Total CHOL/HDL Ratio: 4
VLDL: 26.2 mg/dL (ref 0.0–40.0)

## 2016-11-10 LAB — LDL CHOLESTEROL, DIRECT: LDL DIRECT: 157 mg/dL

## 2016-11-10 LAB — TSH: TSH: 1.5 u[IU]/mL (ref 0.35–4.50)

## 2016-11-10 LAB — VITAMIN D 25 HYDROXY (VIT D DEFICIENCY, FRACTURES): VITD: 28.52 ng/mL — AB (ref 30.00–100.00)

## 2016-11-10 LAB — POCT GLYCOSYLATED HEMOGLOBIN (HGB A1C): Hemoglobin A1C: 5.4

## 2016-11-10 MED ORDER — VENLAFAXINE HCL ER 75 MG PO CP24: 75.0000 mg | ORAL_CAPSULE | Freq: Every day | ORAL | 0 refills | Status: DC

## 2016-11-10 MED ORDER — BUSPIRONE HCL 30 MG PO TABS: 30.0000 mg | ORAL_TABLET | Freq: Three times a day (TID) | ORAL | 2 refills | Status: DC
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
## 2016-11-10 NOTE — Progress Notes (Signed)
Pre visit review using our clinic review tool, if applicable. No additional management support is needed unless otherwise documented below in the visit note. 

## 2016-11-10 NOTE — Patient Instructions (Addendum)
Try the Heritage Flakes cereal available at [facility] ([facility]) on the bottom shelf, with vanilla flavored almond milk   You might want to try a premixed protein drink called Premier Protein shake for breakfast or late night snack . It is great tasting,   very low sugar and available of < $2 serving at [facility] and  In bulk for $1.50/serving at [employer] and [employer]  .    Nutritional analysis :  160 cal  30 g protein  1 g sugar 50% calcium needs   [employer] and [employer]   I WANT YOU TO QUIT SMOKING THIS YEAR!!!  This is the year!!   Health Maintenance for Postmenopausal Women Menopause is a normal process in which your reproductive ability comes to an end. This process happens gradually over a span of months to years, usually between the ages of 66 and 42. Menopause is complete when you have missed 12 consecutive menstrual periods. It is important to talk with your health care provider about some of the most common conditions that affect postmenopausal women, such as heart disease, cancer, and bone loss (osteoporosis). Adopting a healthy lifestyle and getting preventive care can help to promote your health and wellness. Those actions can also lower your chances of developing some of these common conditions. What should I know about menopause? During menopause, you may experience a number of symptoms, such as:  Moderate-to-severe hot flashes.  Night sweats.  Decrease in sex drive.  Mood swings.  Headaches.  Tiredness.  Irritability.  Memory problems.  Insomnia. Choosing to treat or not to treat menopausal changes is an individual decision that you make with your health care provider. What should I know about hormone replacement therapy and supplements? Hormone therapy products are effective for treating symptoms that are associated with menopause, such as hot flashes and night sweats. Hormone replacement carries certain risks, especially as you become older. If you are thinking about using estrogen  or estrogen with progestin treatments, discuss the benefits and risks with your health care provider. What should I know about heart disease and stroke? Heart disease, heart attack, and stroke become more likely as you age. This may be due, in part, to the hormonal changes that your body experiences during menopause. These can affect how your body processes dietary fats, triglycerides, and cholesterol. Heart attack and stroke are both medical emergencies. There are many things that you can do to help prevent heart disease and stroke:  Have your blood pressure checked at least every 1-2 years. High blood pressure causes heart disease and increases the risk of stroke.  If you are 77-9 years old, ask your health care provider if you should take aspirin to prevent a heart attack or a stroke.  Do not use any tobacco products, including cigarettes, chewing tobacco, or electronic cigarettes. If you need help quitting, ask your health care provider.  It is important to eat a healthy diet and maintain a healthy weight.  Be sure to include plenty of vegetables, fruits, low-fat dairy products, and lean protein.  Avoid eating foods that are high in solid fats, added sugars, or salt (sodium).  Get regular exercise. This is one of the most important things that you can do for your health.  Try to exercise for at least 150 minutes each week. The type of exercise that you do should increase your heart rate and make you sweat. This is known as moderate-intensity exercise.  Try to do strengthening exercises at least twice each week.  Do these in addition to the moderate-intensity exercise.  Know your numbers.Ask your health care provider to check your cholesterol and your blood glucose. Continue to have your blood tested as directed by your health care provider. What should I know about cancer screening? There are several types of cancer. Take the following steps to reduce your risk and to catch any cancer  development as early as possible. Breast Cancer  Practice breast self-awareness.  This means understanding how your breasts normally appear and feel.  It also means doing regular breast self-exams. Let your health care provider know about any changes, no matter how small.  If you are 58 or older, have a clinician do a breast exam (clinical breast exam or CBE) every year. Depending on your age, family history, and medical history, it may be recommended that you also have a yearly breast X-ray (mammogram).  If you have a family history of breast cancer, talk with your health care provider about genetic screening.  If you are at high risk for breast cancer, talk with your health care provider about having an MRI and a mammogram every year.  Breast cancer (BRCA) gene test is recommended for women who have family members with BRCA-related cancers. Results of the assessment will determine the need for genetic counseling and BRCA1 and for BRCA2 testing. BRCA-related cancers include these types:  Breast. This occurs in males or females.  Ovarian.  Tubal. This may also be called fallopian tube cancer.  Cancer of the abdominal or pelvic lining (peritoneal cancer).  Prostate.  Pancreatic. Cervical, Uterine, and Ovarian Cancer  Your health care provider may recommend that you be screened regularly for cancer of the pelvic organs. These include your ovaries, uterus, and vagina. This screening involves a pelvic exam, which includes checking for microscopic changes to the surface of your cervix (Pap test).  For women ages 21-65, health care providers may recommend a pelvic exam and a Pap test every three years. For women ages 55-65, they may recommend the Pap test and pelvic exam, combined with testing for human papilloma virus (HPV), every five years. Some types of HPV increase your risk of cervical cancer. Testing for HPV may also be done on women of any age who have unclear Pap test  results.  Other health care providers may not recommend any screening for nonpregnant women who are considered low risk for pelvic cancer and have no symptoms. Ask your health care provider if a screening pelvic exam is right for you.  If you have had past treatment for cervical cancer or a condition that could lead to cancer, you need Pap tests and screening for cancer for at least 20 years after your treatment. If Pap tests have been discontinued for you, your risk factors (such as having a new sexual partner) need to be reassessed to determine if you should start having screenings again. Some women have medical problems that increase the chance of getting cervical cancer. In these cases, your health care provider may recommend that you have screening and Pap tests more often.  If you have a family history of uterine cancer or ovarian cancer, talk with your health care provider about genetic screening.  If you have vaginal bleeding after reaching menopause, tell your health care provider.  There are currently no reliable tests available to screen for ovarian cancer. Lung Cancer  Lung cancer screening is recommended for adults 18-1 years old who are at high risk for lung cancer because of a history of  smoking. A yearly low-dose CT scan of the lungs is recommended if you:  Currently smoke.  Have a history of at least 30 pack-years of smoking and you currently smoke or have quit within the past 15 years. A pack-year is smoking an average of one pack of cigarettes per day for one year. Yearly screening should:  Continue until it has been 15 years since you quit.  Stop if you develop a health problem that would prevent you from having lung cancer treatment. Colorectal Cancer  This type of cancer can be detected and can often be prevented.  Routine colorectal cancer screening usually begins at age 6 and continues through age 77.  If you have risk factors for colon cancer, your health care  provider may recommend that you be screened at an earlier age.  If you have a family history of colorectal cancer, talk with your health care provider about genetic screening.  Your health care provider may also recommend using home test kits to check for hidden blood in your stool.  A small camera at the end of a tube can be used to examine your colon directly (sigmoidoscopy or colonoscopy). This is done to check for the earliest forms of colorectal cancer.  Direct examination of the colon should be repeated every 5-10 years until age 41. However, if early forms of precancerous polyps or small growths are found or if you have a family history or genetic risk for colorectal cancer, you may need to be screened more often. Skin Cancer  Check your skin from head to toe regularly.  Monitor any moles. Be sure to tell your health care provider:  About any new moles or changes in moles, especially if there is a change in a mole's shape or color.  If you have a mole that is larger than the size of a pencil eraser.  If any of your family members has a history of skin cancer, especially at a young age, talk with your health care provider about genetic screening.  Always use sunscreen. Apply sunscreen liberally and repeatedly throughout the day.  Whenever you are outside, protect yourself by wearing long sleeves, pants, a wide-brimmed hat, and sunglasses. What should I know about osteoporosis? Osteoporosis is a condition in which bone destruction happens more quickly than new bone creation. After menopause, you may be at an increased risk for osteoporosis. To help prevent osteoporosis or the bone fractures that can happen because of osteoporosis, the following is recommended:  If you are 44-64 years old, get at least 1,000 mg of calcium and at least 600 mg of vitamin D per day.  If you are older than age 72 but younger than age 70, get at least 1,200 mg of calcium and at least 600 mg of vitamin D  per day.  If you are older than age 55, get at least 1,200 mg of calcium and at least 800 mg of vitamin D per day. Smoking and excessive alcohol intake increase the risk of osteoporosis. Eat foods that are rich in calcium and vitamin D, and do weight-bearing exercises several times each week as directed by your health care provider. What should I know about how menopause affects my mental health? Depression may occur at any age, but it is more common as you become older. Common symptoms of depression include:  Low or sad mood.  Changes in sleep patterns.  Changes in appetite or eating patterns.  Feeling an overall lack of motivation or enjoyment of activities  that you previously enjoyed.  Frequent crying spells. Talk with your health care provider if you think that you are experiencing depression. What should I know about immunizations? It is important that you get and maintain your immunizations. These include:  Tetanus, diphtheria, and pertussis (Tdap) booster vaccine.  Influenza every year before the flu season begins.  Pneumonia vaccine.  Shingles vaccine. Your health care provider may also recommend other immunizations. This information is not intended to replace advice given to you by your health care provider. Make sure you discuss any questions you have with your health care provider. Document Released: 10/21/2005 Document Revised: 03/18/2016 Document Reviewed: 06/02/2015 Elsevier Interactive Patient Education  2017 [employer].

## 2016-11-12 ENCOUNTER — Other Ambulatory Visit: Payer: Self-pay | Admitting: Internal Medicine

## 2016-11-12 DIAGNOSIS — R748 Abnormal levels of other serum enzymes: Secondary | ICD-10-CM

## 2016-11-12 DIAGNOSIS — R7303 Prediabetes: Secondary | ICD-10-CM | POA: Insufficient documentation

## 2016-11-12 DIAGNOSIS — E669 Obesity, unspecified: Secondary | ICD-10-CM | POA: Insufficient documentation

## 2016-11-12 DIAGNOSIS — K7689 Other specified diseases of liver: Secondary | ICD-10-CM

## 2016-11-12 NOTE — Assessment & Plan Note (Signed)
Secondary to menorrhagia   Lab Results  Component Value Date   WBC 8.7 11/10/2016   HGB 12.7 11/10/2016   HCT 38.8 11/10/2016   MCV 83.3 11/10/2016   PLT 261.0 11/10/2016

## 2016-11-12 NOTE — Assessment & Plan Note (Signed)
Suspect rotator cuff tear vs DJD cervical spine, plain films suggest rotator cuff syndrome.  Referral to sport medicine  advised . NSAIDs and ice , activity modification

## 2016-11-12 NOTE — Assessment & Plan Note (Signed)
Spent 3 minutes discussing risk of continued tobacco abuse, including but not limited to CAD, PAD, hypertension, and CA.  He is not interested in pharmacotherapy at this time. 

## 2016-11-12 NOTE — Assessment & Plan Note (Signed)
continue Buspar and effexor.  She has declined  referral to psychiatry.

## 2016-11-12 NOTE — Assessment & Plan Note (Signed)
Annual comprehensive preventive exam was done as well as an evaluation and management of chronic conditions .  During the course of the visit the patient was educated and counseled about appropriate screening and preventive services including :  diabetes screening, lipid analysis with projected  10 year  risk for CAD , nutrition counseling, breast, cervical and colorectal cancer screening, and recommended immunizations.  Printed recommendations for health maintenance screenings was given 

## 2016-11-12 NOTE — Assessment & Plan Note (Signed)
Suggested by prior a1c of 6.0 in 2016.  Now normalized at 5.4

## 2016-11-12 NOTE — Assessment & Plan Note (Signed)
Persistent on repeat labs. Had a normal liver US last year except for cysts noted,  Repeat due

## 2016-11-14 LAB — CYTOLOGY - PAP
Adequacy: ABSENT
Diagnosis: NEGATIVE
HPV: NOT DETECTED

## 2016-11-15 ENCOUNTER — Other Ambulatory Visit: Payer: Self-pay | Admitting: Internal Medicine

## 2016-11-15 ENCOUNTER — Telehealth: Payer: Self-pay | Admitting: Internal Medicine

## 2016-11-15 DIAGNOSIS — R748 Abnormal levels of other serum enzymes: Secondary | ICD-10-CM

## 2016-11-15 NOTE — Telephone Encounter (Signed)
Pt called asking if we could send her a copy of her labs along with Dr. Lupita Dawn recommendations/findings. Pleas advise, thank you!  Pt's address - [address] [place] [id number]

## 2016-11-15 NOTE — Telephone Encounter (Signed)
Copies mailed to pt. Pt informed

## 2016-11-17 ENCOUNTER — Encounter: Payer: Self-pay | Admitting: Internal Medicine

## 2016-11-23 ENCOUNTER — Ambulatory Visit
Admission: RE | Admit: 2016-11-23 | Discharge: 2016-11-23 | Disposition: A | Discharge status: Home / Self Care | Payer: Medicare Other | Source: Ambulatory Visit | Attending: Internal Medicine | Admitting: Internal Medicine

## 2016-11-23 DIAGNOSIS — R748 Abnormal levels of other serum enzymes: Secondary | ICD-10-CM | POA: Insufficient documentation

## 2016-11-23 DIAGNOSIS — K7689 Other specified diseases of liver: Secondary | ICD-10-CM | POA: Diagnosis not present

## 2016-11-25 ENCOUNTER — Other Ambulatory Visit: Payer: Self-pay | Admitting: Internal Medicine

## 2016-11-25 DIAGNOSIS — R748 Abnormal levels of other serum enzymes: Secondary | ICD-10-CM

## 2016-11-25 DIAGNOSIS — K7689 Other specified diseases of liver: Secondary | ICD-10-CM

## 2016-12-28 ENCOUNTER — Encounter: Payer: Self-pay | Admitting: Gastroenterology

## 2016-12-28 ENCOUNTER — Ambulatory Visit (INDEPENDENT_AMBULATORY_CARE_PROVIDER_SITE_OTHER): Payer: Medicare Other | Admitting: Gastroenterology

## 2016-12-28 ENCOUNTER — Other Ambulatory Visit: Payer: Self-pay

## 2016-12-28 ENCOUNTER — Encounter (INDEPENDENT_AMBULATORY_CARE_PROVIDER_SITE_OTHER): Payer: Self-pay

## 2016-12-28 VITALS — BP 115/72 | HR 80 | Temp 98.1°F | Ht 63.0 in | Wt 183.5 lb

## 2016-12-28 DIAGNOSIS — R748 Abnormal levels of other serum enzymes: Secondary | ICD-10-CM | POA: Diagnosis not present

## 2016-12-28 NOTE — Progress Notes (Signed)
Gastroenterology Consultation  Referring Provider:     [name], MD Primary Care Physician:  [name], MD Primary Gastroenterologist:  Dr. Allen Norris     Reason for Consultation:     Abnormal liver enzymes        HPI:   Kayla Farmer is a 51 y.o. y/o female referred for consultation & management of Abnormal liver enzymes by Dr. [name], MD.  This patient comes in today with a history of abnormal alkaline phosphatase. The patient had a ultrasound of the liver that showed some stable hepatic cyst. The patient most recent alk phosphatase was 160 with her previous alkaline phosphatase being and 138. The patient's alk phosphatase was normal back in 2014. The patient was also found to have a slightly low iron with a significantly low iron saturation. The patient also reports that she has right-sided pain that goes from her back around the front. The patient states it is made worse when she even touches it lightly or when her husband puts his hand on her side. She states that it is occurring when she is just sitting in the car doing nothing. It is not associated with any eating or drinking.  Past Medical History:  Diagnosis Date  . Arthritis   . GERD (gastroesophageal reflux disease)   . History of cervical cancer   . Other inflammations of eyelids   . Sinus infection     No past surgical history on file.  Prior to Admission medications   Medication Sig Start Date End Date Taking? Authorizing Provider  busPIRone (BUSPAR) 30 MG tablet Take 1 tablet (30 mg total) by mouth 3 (three) times daily. 11/10/16   [name], MD  omeprazole (PRILOSEC) 40 MG capsule take 1 capsule by mouth once daily 09/30/16   [name], MD  venlafaxine XR (EFFEXOR-XR) 75 MG 24 hr capsule Take 1 capsule (75 mg total) by mouth daily. 11/10/16   [name], MD    Family History  Problem Relation Age of Onset  . Coronary artery disease Mother   . Coronary artery disease Father     smoker  .  Breast cancer Neg Hx      Social History  Substance Use Topics  . Smoking status: Current Every Day Smoker    Packs/day: 1.00    Years: 30.00    Types: Cigarettes  . Smokeless tobacco: Never Used     Comment: smokes one pack of cigarettes per day since age 2, quit during pregnancies.  . Alcohol use No    Allergies as of 12/28/2016 - Review Complete 11/10/2016  Allergen Reaction Noted  . Codeine Itching 06/24/2015    Review of Systems:    All systems reviewed and negative except where noted in HPI.   Physical Exam:  LMP 10/13/2014 (Approximate)  Patient's last menstrual period was 10/13/2014 (approximate). Psych:  Alert and cooperative. Normal mood and affect. General:   Alert,  Well-developed, well-nourished, pleasant and cooperative in NAD Head:  Normocephalic and atraumatic. Eyes:  Sclera clear, no icterus.   Conjunctiva pink. Ears:  Normal auditory acuity. Nose:  No deformity, discharge, or lesions. Mouth:  No deformity or lesions,oropharynx pink & moist. Neck:  Supple; no masses or thyromegaly. Lungs:  Respirations even and unlabored.  Clear throughout to auscultation.   No wheezes, crackles, or rhonchi. No acute distress. Heart:  Regular rate and rhythm; no murmurs, clicks, rubs, or gallops. Abdomen:  Normal bowel sounds.  No bruits.  Soft, Mildly tender on the ribs on the right side without any liver tenderness and non-distended without masses, hepatosplenomegaly or hernias noted.  No guarding or rebound tenderness.  Negative Carnett sign.   Rectal:  Deferred.  Msk:  Symmetrical without gross deformities.  Good, equal movement & strength bilaterally. Pulses:  Normal pulses noted. Extremities:  No clubbing or edema.  No cyanosis. Neurologic:  Alert and oriented x3;  grossly normal neurologically. Skin:  Intact without significant lesions or rashes.  No jaundice. Lymph Nodes:  No significant cervical adenopathy. Psych:  Alert and cooperative. Normal mood and  affect.  Imaging Studies: No results found.  Assessment and Plan:   Kayla Farmer is a 51 y.o. y/o female who has a persistently elevated alkaline phosphatase. The patient will have her blood sent off for antimitochondrial antibody for possible PBC. The patient will also have the alkaline phosphatase fractionated to see if the primary source of the alk phosphatase is the liver or the bone. The patient will also have her liver enzymes checked again as well as iron studies because she states that she feels weak and feels that her iron may be even lower. The patient has been explained the plan and agrees with it.    Lucilla Lame, MD. Marval Regal   Note: This dictation was prepared with Dragon dictation along with smaller phrase technology. Any transcriptional errors that result from this process are unintentional.

## 2016-12-30 LAB — ALKALINE PHOSPHATASE, ISOENZYMES
BONE FRACTION: 49 % (ref 14–68)
INTESTINAL FRAC.: 0 % (ref 0–18)
LIVER FRACTION: 51 % (ref 18–85)

## 2016-12-30 LAB — HEPATIC FUNCTION PANEL
ALK PHOS: 185 IU/L — AB (ref 39–117)
ALT: 19 IU/L (ref 0–32)
AST: 22 IU/L (ref 0–40)
Albumin: 4.3 g/dL (ref 3.5–5.5)
BILIRUBIN, DIRECT: 0.02 mg/dL (ref 0.00–0.40)
Total Protein: 7 g/dL (ref 6.0–8.5)

## 2016-12-30 LAB — MITOCHONDRIAL ANTIBODIES: Mitochondrial Ab: 15.2 Units (ref 0.0–20.0)

## 2016-12-30 LAB — IRON AND TIBC
IRON SATURATION: 12 % — AB (ref 15–55)
Iron: 37 ug/dL (ref 27–159)
TIBC: 309 ug/dL (ref 250–450)
UIBC: 272 ug/dL (ref 131–425)

## 2016-12-30 LAB — FERRITIN: Ferritin: 32 ng/mL (ref 15–150)

## 2017-01-03 ENCOUNTER — Telehealth: Payer: Self-pay | Admitting: Gastroenterology

## 2017-01-03 NOTE — Telephone Encounter (Signed)
Patient would like a call back regarding results

## 2017-01-04 NOTE — Telephone Encounter (Signed)
Only 5 of the 9 are back. Am waiting for the rest.

## 2017-01-04 NOTE — Telephone Encounter (Signed)
Please review labs and advise.

## 2017-01-17 ENCOUNTER — Telehealth: Payer: Self-pay

## 2017-01-17 NOTE — Telephone Encounter (Signed)
-----   Message from Lucilla Lame, MD sent at 01/16/2017  8:41 PM EDT ----- The patient knows that the fractionation of her alkaline phosphatase showed that her bone and liver fraction were about equal and did not point to the culprit of where her increased alkaline phosphatase is coming from. Since this has persisted For over a year the patient should be set up for liver biopsy to look for the cause of the increased alkaline phosphatase of days.

## 2017-01-17 NOTE — Telephone Encounter (Signed)
Pt has been notified of results. Pt stated she has been on the effexor for over a year. You had already sent me these results and wanted me to ask her how long she had been on the Effexor. She stated she has been taking this well over a year. Should we move forward with the liver biopsy or is this elevation coming from the medication?

## 2017-01-18 NOTE — Telephone Encounter (Signed)
Left pt vm for pt to return my call.

## 2017-01-18 NOTE — Telephone Encounter (Signed)
There is no way to find out if the Effexor is causing her problems unless it is stopped and rechecked in a month after stopping it.

## 2017-01-19 NOTE — Telephone Encounter (Signed)
Left vm again for pt to return my call.

## 2017-01-25 ENCOUNTER — Other Ambulatory Visit: Payer: Self-pay | Admitting: Internal Medicine

## 2017-02-09 ENCOUNTER — Other Ambulatory Visit: Payer: Self-pay

## 2017-02-09 NOTE — Telephone Encounter (Signed)
Mailed letter requesting a return call to schedule liver biopsy.

## 2017-04-04 ENCOUNTER — Telehealth: Payer: Self-pay | Admitting: Internal Medicine

## 2017-04-04 ENCOUNTER — Telehealth: Payer: Self-pay | Admitting: Gastroenterology

## 2017-04-04 MED ORDER — BUSPIRONE HCL 30 MG PO TABS: 30.0000 mg | ORAL_TABLET | Freq: Three times a day (TID) | ORAL | 1 refills | Status: DC

## 2017-04-04 MED ORDER — OMEPRAZOLE 40 MG PO CPDR: 40.0000 mg | DELAYED_RELEASE_CAPSULE | Freq: Every day | ORAL | 1 refills | Status: DC

## 2017-04-04 MED ORDER — VENLAFAXINE HCL ER 75 MG PO CP24: 75.0000 mg | ORAL_CAPSULE | Freq: Every day | ORAL | 1 refills | Status: DC

## 2017-04-04 NOTE — Telephone Encounter (Signed)
rxs have been sent.

## 2017-04-04 NOTE — Telephone Encounter (Signed)
Patient called and is ready to schedule liver biopsy, please call patient.

## 2017-04-04 NOTE — Telephone Encounter (Signed)
Pt called requesting refills on omeprazole (PRILOSEC) 40 MG capsule, venlafaxine XR (EFFEXOR-XR) 75 MG 24 hr capsule, and busPIRone (BUSPAR) 30 MG tablet. Please advise, thank you!  Pharmacy - [facility], [place]

## 2017-04-06 NOTE — Telephone Encounter (Signed)
Pt has requested the liver biopsy be done at [facility]. Referral has been done via [facility]. Pt advised they will review her information and contact her to schedule.

## 2017-04-18 ENCOUNTER — Other Ambulatory Visit: Payer: Self-pay

## 2017-04-18 DIAGNOSIS — R748 Abnormal levels of other serum enzymes: Secondary | ICD-10-CM

## 2017-04-20 ENCOUNTER — Other Ambulatory Visit: Payer: Self-pay

## 2017-04-25 ENCOUNTER — Other Ambulatory Visit: Payer: Self-pay | Admitting: Radiology

## 2017-04-26 ENCOUNTER — Ambulatory Visit
Admission: RE | Admit: 2017-04-26 | Discharge: 2017-04-26 | Disposition: A | Discharge status: Home / Self Care | Payer: Medicare Other | Source: Ambulatory Visit | Attending: Gastroenterology | Admitting: Gastroenterology

## 2017-04-26 DIAGNOSIS — K760 Fatty (change of) liver, not elsewhere classified: Secondary | ICD-10-CM | POA: Diagnosis not present

## 2017-04-26 DIAGNOSIS — R748 Abnormal levels of other serum enzymes: Secondary | ICD-10-CM

## 2017-04-26 HISTORY — PX: LIVER BIOPSY: SHX301

## 2017-04-26 LAB — CBC WITH DIFFERENTIAL/PLATELET
Basophils Absolute: 0.1 10*3/uL (ref 0–0.1)
Basophils Relative: 1 %
EOS ABS: 0.1 10*3/uL (ref 0–0.7)
EOS PCT: 2 %
HCT: 37.1 % (ref 35.0–47.0)
Hemoglobin: 12.5 g/dL (ref 12.0–16.0)
LYMPHS ABS: 2.1 10*3/uL (ref 1.0–3.6)
Lymphocytes Relative: 27 %
MCH: 27.2 pg (ref 26.0–34.0)
MCHC: 33.8 g/dL (ref 32.0–36.0)
MCV: 80.4 fL (ref 80.0–100.0)
MONOS PCT: 5 %
Monocytes Absolute: 0.4 10*3/uL (ref 0.2–0.9)
Neutro Abs: 5.2 10*3/uL (ref 1.4–6.5)
Neutrophils Relative %: 65 %
PLATELETS: 220 10*3/uL (ref 150–440)
RBC: 4.61 MIL/uL (ref 3.80–5.20)
RDW: 14.4 % (ref 11.5–14.5)
WBC: 7.9 10*3/uL (ref 3.6–11.0)

## 2017-04-26 LAB — COMPREHENSIVE METABOLIC PANEL
ALT: 15 U/L (ref 14–54)
ANION GAP: 8 (ref 5–15)
AST: 23 U/L (ref 15–41)
Albumin: 3.7 g/dL (ref 3.5–5.0)
Alkaline Phosphatase: 158 U/L — ABNORMAL HIGH (ref 38–126)
BUN: 19 mg/dL (ref 6–20)
CHLORIDE: 108 mmol/L (ref 101–111)
CO2: 25 mmol/L (ref 22–32)
Calcium: 8.9 mg/dL (ref 8.9–10.3)
Creatinine, Ser: 0.92 mg/dL (ref 0.44–1.00)
GFR calc non Af Amer: >60 mL/min (ref >60)
Glucose, Bld: 101 mg/dL — ABNORMAL HIGH (ref 65–99)
Potassium: 3.9 mmol/L (ref 3.5–5.1)
SODIUM: 141 mmol/L (ref 135–145)
Total Bilirubin: 0.5 mg/dL (ref 0.3–1.2)
Total Protein: 7 g/dL (ref 6.5–8.1)

## 2017-04-26 LAB — PROTIME-INR
INR: 0.94
Prothrombin Time: 12.6 seconds (ref 11.4–15.2)

## 2017-04-26 MED ORDER — FENTANYL CITRATE (PF) 100 MCG/2ML IJ SOLN
INTRAMUSCULAR | Status: DC
Start: 2017-04-26 — End: 2017-04-26
  Filled 2017-04-26: qty 2

## 2017-04-26 MED ORDER — MIDAZOLAM HCL 5 MG/5ML IJ SOLN
INTRAMUSCULAR | Status: AC | PRN
  Administered 2017-04-26: 1 mg via INTRAVENOUS
  Administered 2017-04-26 (×2): 0.5 mg via INTRAVENOUS
  Administered 2017-04-26: 1 mg via INTRAVENOUS

## 2017-04-26 MED ORDER — MIDAZOLAM HCL 5 MG/5ML IJ SOLN
INTRAMUSCULAR | Status: AC
  Filled 2017-04-26: qty 5

## 2017-04-26 MED ORDER — FENTANYL CITRATE (PF) 100 MCG/2ML IJ SOLN
INTRAMUSCULAR | Status: AC | PRN
  Administered 2017-04-26: 25 ug via INTRAVENOUS
  Administered 2017-04-26: 50 ug via INTRAVENOUS

## 2017-04-26 MED ORDER — FENTANYL CITRATE (PF) 100 MCG/2ML IJ SOLN
INTRAMUSCULAR | Status: AC
  Filled 2017-04-26: qty 2

## 2017-04-26 MED ORDER — SODIUM CHLORIDE 0.9 % IV SOLN
INTRAVENOUS | Status: DC
  Administered 2017-04-26: 10:00:00 via INTRAVENOUS

## 2017-04-26 NOTE — H&P (Signed)
Chief Complaint: Patient was seen in consultation today for liver biopsy at the request of [place]  Referring Physician(s): [place]  Patient Status: [facility] - Out-pt  History of Present Illness: Kayla Farmer is a 51 y.o. female with a history of persistent elevation of alkaline phosphatase here for liver biopsy for further evaluation.  Some occasional mild tenderness and sensitivity over right lower costal region.  Past Medical History:  Diagnosis Date  . Arthritis   . GERD (gastroesophageal reflux disease)   . History of cervical cancer   . Other inflammations of eyelids   . Sinus infection     No past surgical history on file.  Allergies: Codeine  Medications: Prior to Admission medications   Medication Sig Start Date End Date Taking? Authorizing Provider  busPIRone (BUSPAR) 30 MG tablet Take 1 tablet (30 mg total) by mouth 3 (three) times daily. 04/04/17  Yes [name], MD  omeprazole (PRILOSEC) 40 MG capsule Take 1 capsule (40 mg total) by mouth daily. 04/04/17  Yes [name], MD  venlafaxine XR (EFFEXOR-XR) 75 MG 24 hr capsule Take 1 capsule (75 mg total) by mouth daily. 04/04/17  Yes [name], MD     Family History  Problem Relation Age of Onset  . Coronary artery disease Mother   . Coronary artery disease Father        smoker  . Breast cancer Neg Hx     Social History   Social History  . Marital status: Married    Spouse name: N/A  . Number of children: N/A  . Years of education: N/A   Social History Main Topics  . Smoking status: Current Every Day Smoker    Packs/day: 1.00    Years: 30.00    Types: Cigarettes  . Smokeless tobacco: Never Used     Comment: smokes one pack of cigarettes per day since age 39, quit during pregnancies.  . Alcohol use No  . Drug use: No  . Sexual activity: Not on file   Other Topics Concern  . Not on file   Social History Narrative   Lives with spouse and children.  Daughter in Sports coach of Loletha Carrow  and Josph Macho.   Quit high school in the 9th grade.  Was raised by grandmother due to parental conflicts.     Review of Systems: A 12 point ROS discussed and pertinent positives are indicated in the HPI above.  All other systems are negative.  Review of Systems  Constitutional: Negative.   Respiratory: Negative.   Cardiovascular: Negative.   Gastrointestinal: Negative.   Genitourinary: Negative.   Musculoskeletal: Negative.   Neurological: Negative.     Vital Signs: BP 115/83 (BP Location: Left Arm)   Pulse 74   Resp 11   Ht 5' 3"  (1.6 m)   Wt 183 lb (83 kg)   LMP 10/13/2014 (Approximate)   SpO2 96%   BMI 32.42 kg/m   Physical Exam  Constitutional: She is oriented to person, place, and time. She appears well-developed and well-nourished. No distress.  HENT:  Head: Normocephalic and atraumatic.  Neck: Neck supple. No JVD present.  Cardiovascular: Normal rate, regular rhythm and normal heart sounds.  Exam reveals no gallop and no friction rub.   No murmur heard. Pulmonary/Chest: Effort normal and breath sounds normal. No stridor. No respiratory distress. She has no wheezes. She has no rales.  Abdominal: Soft. Bowel sounds are normal. She exhibits no distension and no mass. There is no  tenderness. There is no rebound and no guarding.  Musculoskeletal: She exhibits no edema.  Neurological: She is alert and oriented to person, place, and time.  Skin: She is not diaphoretic.  Vitals reviewed.   Mallampati Score:  MD Evaluation Airway: WNL Heart: WNL Abdomen: WNL Chest/ Lungs: WNL ASA  Classification: 2 Mallampati/Airway Score: One  Imaging: No results found.  Labs:  CBC:  Recent Labs  11/10/16 1028 04/26/17 0920  WBC 8.7 7.9  HGB 12.7 12.5  HCT 38.8 37.1  PLT 261.0 220    COAGS:  Recent Labs  04/26/17 0920  INR 0.94    BMP:  Recent Labs  11/10/16 1028 04/26/17 0920  NA 138 141  K 4.5 3.9  CL 105 108  CO2 29 25  GLUCOSE 91 101*  BUN  18 19  CALCIUM 9.5 8.9  CREATININE 0.84 0.92  GFRNONAA  --  >60  GFRAA  --  >60    LIVER FUNCTION TESTS:  Recent Labs  11/10/16 1028 12/28/16 0935 04/26/17 0920  BILITOT 0.4 <0.2 0.5  AST 18 22 23   ALT 18 19 15   ALKPHOS 160* 185* 158*  PROT 6.8 7.0 7.0  ALBUMIN 4.0 4.3 3.7    Assessment and Plan:  For ultrasound guided random core biopsy of liver today.  Labs fine to proceed with biopsy today.  Risks and benefits discussed with the patient including, but not limited to bleeding, infection, damage to adjacent structures or low yield requiring additional tests. All of the patient's questions were answered, patient is agreeable to proceed. Consent signed and in chart.  Thank you for this interesting consult.  I greatly enjoyed meeting Kayla Farmer and look forward to participating in their care.  A copy of this report was sent to the requesting provider on this date.  Electronically Signed: Azzie Roup, MD 04/26/2017, 10:18 AM   I spent a total of 30 Minutes in face to face in clinical consultation, greater than 50% of which was counseling/coordinating care for liver biopsy.

## 2017-04-26 NOTE — OR Nursing (Signed)
Report and care of patient taken from [place], [place] at 11:30.

## 2017-04-26 NOTE — Procedures (Signed)
Interventional Radiology Procedure Note  Procedure: US guided core biopsy of liver   Complications: None  Estimated Blood Loss: < 10 mL  18 G core biopsy x 3 via 17 G needle.  Solid samples obtained.  Venetia Night. Kathlene Cote, M.D Pager:  [phone]

## 2017-05-01 LAB — SURGICAL PATHOLOGY

## 2017-05-02 ENCOUNTER — Telehealth: Payer: Self-pay

## 2017-05-02 NOTE — Telephone Encounter (Signed)
Pt is calling requesting liver biopsy results. Please review in labs and advise. Doesn't look like they were forwarded to you??
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
## 2017-05-03 NOTE — Telephone Encounter (Signed)
LVM for pt to return my call.

## 2017-05-04 NOTE — Telephone Encounter (Signed)
Pt returned my call and notified of liver biopsy results. Pt stated she was told to take it easy for 2 weeks post op and to keep the area bandaged until it heals. Advised her to notify me if there is any changes in her symptoms.

## 2017-05-11 DIAGNOSIS — H04123 Dry eye syndrome of bilateral lacrimal glands: Secondary | ICD-10-CM | POA: Diagnosis not present

## 2017-05-31 ENCOUNTER — Other Ambulatory Visit: Payer: Self-pay | Admitting: Internal Medicine

## 2017-06-08 ENCOUNTER — Encounter: Payer: Self-pay | Admitting: Internal Medicine

## 2017-06-08 ENCOUNTER — Ambulatory Visit (INDEPENDENT_AMBULATORY_CARE_PROVIDER_SITE_OTHER): Payer: Medicare Other | Admitting: Internal Medicine

## 2017-06-08 DIAGNOSIS — F411 Generalized anxiety disorder: Secondary | ICD-10-CM | POA: Diagnosis not present

## 2017-06-08 DIAGNOSIS — R748 Abnormal levels of other serum enzymes: Secondary | ICD-10-CM | POA: Diagnosis not present

## 2017-06-08 MED ORDER — ALPRAZOLAM 0.25 MG PO TABS: 0.2500 mg | ORAL_TABLET | Freq: Two times a day (BID) | ORAL | 0 refills | Status: DC | PRN

## 2017-06-08 NOTE — Progress Notes (Signed)
Subjective:  Patient ID: Kayla Farmer, female    DOB: [birth year]  Age: 51 y.o. MRN: [id number]  CC: Diagnoses of Generalized anxiety disorder and Elevated alkaline phosphatase level were pertinent to this visit.   Kayla Farmer presents for follow  Up on GAD,  IBS , and obesity with prediabetes and fatty liver.   Son getting married in November .    Marital discord: recent estrangement from [place].  Greater than 25 minutes was spent in counselling and discussion on her marital conflict;   Recently separated from husband [name] .  Returned a little less than a month ago at his request to "work things out."  She left him several weeks ago with her 5 yr old daughter,  Has returned home , feels like she is walking on pins and needles.Kim prefers to stay at home and drink alcohol with friends. (husband is my patient and takes chronic narcotics  )   Underwent liver biopsy last month for fatty liver. Reports continued episodes of pain in the area of liver biopsy despite ell healed incision.  Has not seen Dr Allen Norris for follow up. Was never called.   Outpatient Medications Prior to Visit  Medication Sig Dispense Refill  . busPIRone (BUSPAR) 30 MG tablet TAKE 1 TABLET BY MOUTH 3 TIMES DAILY 90 tablet 1  . omeprazole (PRILOSEC) 40 MG capsule Take 1 capsule (40 mg total) by mouth daily. 90 capsule 1  . venlafaxine XR (EFFEXOR-XR) 75 MG 24 hr capsule Take 1 capsule (75 mg total) by mouth daily. 90 capsule 1   No facility-administered medications prior to visit.     Review of Systems;  Patient denies headache, fevers, malaise, unintentional weight loss, skin rash, eye pain, sinus congestion and sinus pain, sore throat, dysphagia,  hemoptysis , cough, dyspnea, wheezing, chest pain, palpitations, orthopnea, edema, abdominal pain, nausea, melena, diarrhea, constipation, flank pain, dysuria, hematuria, urinary  Frequency, nocturia, numbness, tingling, seizures,  Focal weakness, Loss of consciousness,  Tremor,  insomnia, depression, anxiety, and suicidal ideation.      Objective:  BP 106/72 (BP Location: Left Arm, Patient Position: Sitting, Cuff Size: Normal)   Pulse 90   Temp 98.3 F (36.8 C) (Oral)   Resp 14   Ht 5' 3"  (1.6 m)   Wt 182 lb 6.4 oz (82.7 kg)   LMP 10/13/2014 (Approximate)   SpO2 95%   BMI 32.31 kg/m   BP Readings from Last 3 Encounters:  06/08/17 106/72  04/26/17 113/74  12/28/16 115/72    Wt Readings from Last 3 Encounters:  06/08/17 182 lb 6.4 oz (82.7 kg)  04/26/17 183 lb (83 kg)  12/28/16 183 lb 8 oz (83.2 kg)    General appearance: alert, cooperative and appears stated age Ears: normal TM's and external ear canals both ears Throat: lips, mucosa, and tongue normal; teeth and gums normal Neck: no adenopathy, no carotid bruit, supple, symmetrical, trachea midline and thyroid not enlarged, symmetric, no tenderness/mass/nodules Back: symmetric, no curvature. ROM normal. No CVA tenderness. Lungs: clear to auscultation bilaterally Heart: regular rate and rhythm, S1, S2 normal, no murmur, click, rub or gallop Abdomen: soft, non-tender; bowel sounds normal; no masses,  no organomegaly Pulses: 2+ and symmetric Skin: Skin color, texture, turgor normal. No rashes or lesions Lymph nodes: Cervical, supraclavicular, and axillary nodes normal.  Lab Results  Component Value Date   HGBA1C 5.4 11/10/2016   HGBA1C 6.0 09/19/2014    Lab Results  Component Value Date   CREATININE 0.92 04/26/2017  CREATININE 0.84 11/10/2016   CREATININE 0.86 09/25/2015    Lab Results  Component Value Date   WBC 7.9 04/26/2017   HGB 12.5 04/26/2017   HCT 37.1 04/26/2017   PLT 220 04/26/2017   GLUCOSE 101 (H) 04/26/2017   CHOL 250 (H) 11/10/2016   TRIG 131.0 11/10/2016   HDL 62.70 11/10/2016   LDLDIRECT 157.0 11/10/2016   LDLCALC 161 (H) 11/10/2016   ALT 15 04/26/2017   AST 23 04/26/2017   NA 141 04/26/2017   K 3.9 04/26/2017   CL 108 04/26/2017   CREATININE 0.92  04/26/2017   BUN 19 04/26/2017   CO2 25 04/26/2017   TSH 1.50 11/10/2016   INR 0.94 04/26/2017   HGBA1C 5.4 11/10/2016    US Biopsy  Result Date: 04/26/2017 CLINICAL DATA:  Persistent elevation of alkaline phosphatase and need for liver biopsy for further workup. EXAM: ULTRASOUND GUIDED CORE BIOPSY OF LIVER MEDICATIONS: 3.0 mg IV Versed; 75 mcg IV Fentanyl Total Moderate Sedation Time: 11 minutes. The patient's level of consciousness and physiologic status were continuously monitored during the procedure by Radiology nursing. PROCEDURE: The procedure, risks, benefits, and alternatives were explained to the patient. Questions regarding the procedure were encouraged and answered. The patient understands and consents to the procedure. A time out was performed prior to initiating the procedure. Ultrasound was used to localize the liver. Some representative imaging was performed of the liver parenchyma. The right abdominal wall was prepped with chlorhexidine in a sterile fashion, and a sterile drape was applied covering the operative field. A sterile gown and sterile gloves were used for the procedure. Local anesthesia was provided with 1% Lidocaine. Under direct ultrasound guidance, a 17 gauge needle was advanced into the right lobe. A total of 3 separate coaxial 18 gauge core biopsy samples were then obtained of right lobe parenchyma. After the procedure the outer needle was removed and additional ultrasound performed. COMPLICATIONS: None. FINDINGS: By ultrasound, the liver parenchyma is heterogeneous and diffusely echogenic, likely consistent with diffuse steatosis. Solid core biopsy samples were obtained from the liver. IMPRESSION: Ultrasound-guided core biopsy performed within the right lobe of the liver. The liver shows definite heterogeneous echotexture and increased echogenicity, likely reflecting diffuse steatosis. Electronically Signed   By: Aletta Edouard M.D.   On: 04/26/2017 11:43    Assessment  & Plan:   Problem List Items Addressed This Visit    Elevated alkaline phosphatase level    Liver biopsy in August confirmed small area of steatosis but per Dr Allen Norris was suggestive of drug or herbal effect.  Patient has not followed up with him.       Generalized anxiety disorder    Aggravated by marital strife. A total of 25 minutes of face to face time was spent with patient more than half of which was spent in counselling about the above mentioned conditions  and coordination of care continue Buspar and effexor.  She has declined  referral to psychiatry.      Relevant Medications   ALPRAZolam (XANAX) 0.25 MG tablet      I am having Ms. Virgen start on ALPRAZolam. I am also having her maintain her omeprazole, venlafaxine XR, and busPIRone.  Meds ordered this encounter  Medications  . ALPRAZolam (XANAX) 0.25 MG tablet    Sig: Take 1 tablet (0.25 mg total) by mouth 2 (two) times daily as needed for anxiety.    Dispense:  30 tablet    Refill:  0    There are no  discontinued medications.  Follow-up: Return in about 4 weeks (around 07/06/2017).   [name], MD

## 2017-06-08 NOTE — Patient Instructions (Addendum)
I am prescribing xanax to use SPARINGLY.  (not more than once daily) for extreme anxiety  DO NOT USE DURING THE DAY  You don't need any more medication during the day,  When you get upset,  Go out and walk   Your liver biopsy confirmed that you have a fatty liver.     Fatty liver can not be cured m  But if not managed,  It can lead to cirrhosis and liver failure and is becoming increasingly moe common because of the prevalence of obesity in adults in the [place].       I would like to see you back in a month  and I would like your goal to be a weight loss of  4  lbs by then by exercising daily and following a low glycemic index diet

## 2017-06-10 NOTE — Assessment & Plan Note (Signed)
Aggravated by marital strife. A total of 25 minutes of face to face time was spent with patient more than half of which was spent in counselling about the above mentioned conditions  and coordination of care continue Buspar and effexor.  She has declined  referral to psychiatry.

## 2017-06-10 NOTE — Assessment & Plan Note (Addendum)
Liver biopsy in August confirmed small area of steatosis but per Dr Allen Norris was suggestive of drug or herbal effect.  Patient has not followed up with him.

## 2017-07-14 ENCOUNTER — Ambulatory Visit (INDEPENDENT_AMBULATORY_CARE_PROVIDER_SITE_OTHER): Payer: Medicare Other | Admitting: Internal Medicine

## 2017-07-14 ENCOUNTER — Encounter: Payer: Self-pay | Admitting: Internal Medicine

## 2017-07-14 DIAGNOSIS — F411 Generalized anxiety disorder: Secondary | ICD-10-CM

## 2017-07-14 DIAGNOSIS — K760 Fatty (change of) liver, not elsewhere classified: Secondary | ICD-10-CM
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
## 2017-07-14 NOTE — Patient Instructions (Addendum)
Here are the names of several well respected female therapists   Lennon Alstrom    [phone] [place]   [phone] [place] [phone]  Achilles Dunk  [phone]  [place]   Losing weight with a low glycemic diet and regular exercise  Is your goal  4 lb/month max  Here are several low carb protein bars that make great snacks.  All are < 5 g sugar   Power Crunch  KIND 5 g sugar  Quest  Atkins   To make a low carb chip :  Take the Joseph's Lavash or Pita bread,  Or the [place] Low carb whole wheat tortilla   Place on metal cookie sheet  Brush with olive oil  Sprinkle garlic powder (NOT garlic salt), grated parmesan cheese, mediterranean seasoning , or all of them?  Bake at 275 for 30 minutes   We have substitutions for your potatoes!!  Try the mashed cauliflower and riced cauliflower dishes instead of rice and mashed potatoes  Mashed turnips are also very low carb!   For desserts :  Try the Dannon Lt n Fit greek yogurt dessert flavors and top with reddi Whip .  8 carbs,  80 calories  Try Oikos Triple Zero [place] Yogurt in the salted caramel, and the coffee flavors  With Whipped Cream for dessert  breyer's low carb ice cream, available in bars (on a stick, better ) or scoopable ice cream  HERE ARE THE LOW CARB  BREAD CHOICES

## 2017-07-16 NOTE — Assessment & Plan Note (Signed)
Confirmed by liver biopsy. Weight loss by following a  Low glycemic index diet and exercise strongly recommended.

## 2017-07-16 NOTE — Assessment & Plan Note (Signed)
Aggravated by marital strife. A total of 25 minutes of face to face time was spent with patient more than half of which was spent in counselling about the above mentioned conditions  and coordination of care.   continue Buspar and effexor.  She has declined  referral to psychiatry but is receptive to the idea of counselling.  Marland Kitchen

## 2017-07-17 ENCOUNTER — Ambulatory Visit: Payer: Self-pay

## 2017-09-12 DIAGNOSIS — R195 Other fecal abnormalities: Secondary | ICD-10-CM

## 2017-09-12 HISTORY — DX: Other fecal abnormalities: R19.5

## 2017-09-13 ENCOUNTER — Telehealth: Payer: Self-pay

## 2017-09-13 DIAGNOSIS — Z0001 Encounter for general adult medical examination with abnormal findings: Secondary | ICD-10-CM | POA: Insufficient documentation

## 2017-09-13 DIAGNOSIS — Z1211 Encounter for screening for malignant neoplasm of colon: Secondary | ICD-10-CM

## 2017-09-13 NOTE — Telephone Encounter (Signed)
We received FIT(fecal immunochemical test) results back that stated that the sample could not be processed. Called the pt to let her know that she would need to redo this test and she wanted to know if you would be willing to just order the cologuard screening so she does not have go back through the nurse that came out to her house again.

## 2017-09-13 NOTE — Telephone Encounter (Signed)
Yes please order cologuard .  Thank ou

## 2017-09-14 NOTE — Telephone Encounter (Signed)
cologuard has been ordered

## 2017-09-21 ENCOUNTER — Telehealth: Payer: Self-pay | Admitting: Internal Medicine

## 2017-09-21 NOTE — Telephone Encounter (Signed)
Copied from [place] [phone]. Topic: Quick Communication - See Telephone Encounter >> Sep 21, 2017  4:11 PM Ether Griffins B wrote: CRM for notification. See Telephone encounter for:  Elmyra Ricks with [employer] calling in to let the office know that the date of signature is invalid on the form it is out of range. Needs to be for 2019. Her contact number is [phone] for providers dept.  09/21/17.

## 2017-09-21 NOTE — Telephone Encounter (Signed)
Please advise 

## 2017-09-25 NOTE — Telephone Encounter (Signed)
refaxed order with correct date.

## 2017-09-27 ENCOUNTER — Telehealth: Payer: Self-pay | Admitting: Internal Medicine

## 2017-09-27 MED ORDER — VALACYCLOVIR HCL 1 G PO TABS: 1000.0000 mg | ORAL_TABLET | Freq: Two times a day (BID) | ORAL | 0 refills | Status: DC

## 2017-09-27 NOTE — Telephone Encounter (Signed)
Valtrex sent to rte aide,  Twice daily x 7-10 days

## 2017-09-27 NOTE — Telephone Encounter (Signed)
Copied from [place] [phone]. Topic: Quick Communication - See Telephone Encounter >> Sep 27, 2017  2:09 PM Vernona Rieger wrote: CRM for notification. See Telephone encounter for:   09/27/17.  Patient states she has 3-4 fever blisters on her mouth & she wants to know if Dr Derrel Nip will call her something in. Call back is [place], [place]

## 2017-09-27 NOTE — Telephone Encounter (Signed)
Patient has been informed.

## 2017-09-27 NOTE — Telephone Encounter (Signed)
Spoken to patient. She had one fever blister come up then when that went away her lips became chapped . Now she has three fever blisters coming up at once. She would like a medication called in for her.

## 2017-10-03 ENCOUNTER — Other Ambulatory Visit: Payer: Self-pay | Admitting: Internal Medicine

## 2017-10-05 ENCOUNTER — Other Ambulatory Visit: Payer: Self-pay | Admitting: Internal Medicine

## 2017-10-13 ENCOUNTER — Other Ambulatory Visit: Payer: Self-pay | Admitting: Internal Medicine

## 2017-10-13 DIAGNOSIS — Z1231 Encounter for screening mammogram for malignant neoplasm of breast: Secondary | ICD-10-CM

## 2017-11-02 ENCOUNTER — Encounter: Payer: Self-pay | Admitting: Internal Medicine

## 2017-11-02 DIAGNOSIS — Z1212 Encounter for screening for malignant neoplasm of rectum: Secondary | ICD-10-CM | POA: Diagnosis not present

## 2017-11-02 DIAGNOSIS — Z1211 Encounter for screening for malignant neoplasm of colon: Secondary | ICD-10-CM | POA: Diagnosis not present

## 2017-11-02 LAB — COLOGUARD

## 2017-11-08 ENCOUNTER — Ambulatory Visit
Admission: RE | Admit: 2017-11-08 | Discharge: 2017-11-08 | Disposition: A | Discharge status: Home / Self Care | Payer: Medicare Other | Source: Ambulatory Visit | Attending: Internal Medicine | Admitting: Internal Medicine

## 2017-11-08 DIAGNOSIS — N6489 Other specified disorders of breast: Secondary | ICD-10-CM | POA: Diagnosis not present

## 2017-11-08 DIAGNOSIS — R928 Other abnormal and inconclusive findings on diagnostic imaging of breast: Secondary | ICD-10-CM | POA: Insufficient documentation

## 2017-11-08 DIAGNOSIS — Z1231 Encounter for screening mammogram for malignant neoplasm of breast: Secondary | ICD-10-CM | POA: Diagnosis not present

## 2017-11-09 ENCOUNTER — Other Ambulatory Visit: Payer: Self-pay | Admitting: Internal Medicine

## 2017-11-09 DIAGNOSIS — N6489 Other specified disorders of breast: Secondary | ICD-10-CM

## 2017-11-09 DIAGNOSIS — R928 Other abnormal and inconclusive findings on diagnostic imaging of breast: Secondary | ICD-10-CM

## 2017-11-10 ENCOUNTER — Telehealth: Payer: Self-pay | Admitting: Internal Medicine

## 2017-11-10 DIAGNOSIS — R195 Other fecal abnormalities: Secondary | ICD-10-CM

## 2017-11-10 NOTE — Telephone Encounter (Signed)
Spoke with Jiles Garter at [employer] and let her know that we did receive the results. Results have been placed in result folder.

## 2017-11-10 NOTE — Telephone Encounter (Signed)
Copied from [place]. Topic: General - Other >> Nov 10, 2017 10:47 AM Carolyn Stare wrote:  Jiles Garter with [employer] cal lto ask if the abnormal cologuar test was recevied by the doctor [phone]

## 2017-11-10 NOTE — Telephone Encounter (Signed)
cologuard result is positive result has been placed in yellow folder.

## 2017-11-13 DIAGNOSIS — R928 Other abnormal and inconclusive findings on diagnostic imaging of breast: Secondary | ICD-10-CM | POA: Insufficient documentation

## 2017-11-13 NOTE — Telephone Encounter (Signed)
Left message for patient to return call to office [facility] Nurse may give results and advise patient choice.

## 2017-11-13 NOTE — Telephone Encounter (Signed)
  The results of  Your cologuard test were positive meaning  abnormal. This may indicate a polyp or cancer somewhere in the colon.  I  would like to refer you  to GI for further evaluation .   Are you  willing to go and  Do you have a preference whom you see?

## 2017-11-14 ENCOUNTER — Telehealth: Payer: Self-pay | Admitting: Internal Medicine

## 2017-11-14 NOTE — Telephone Encounter (Signed)
Copied from [place]. Topic: Quick Communication - Office Called Patient >> Nov 13, 2017  3:46 PM Nanci Pina, LPN wrote: Reason for CRM: 11/10/17 phone note with results [facility] nurse may give results and advise.  >> Nov 14, 2017  9:34 AM Marin Olp L wrote: Patient calling back for Cologaurd results. No [facility] nurse available. Please call back .

## 2017-11-14 NOTE — Telephone Encounter (Signed)
Documented in another encounter

## 2017-11-14 NOTE — Telephone Encounter (Signed)
Attempted to contact pt regarding lab results; left message at [phone].

## 2017-11-14 NOTE — Telephone Encounter (Signed)
Patient called, results of cologuard given per note Dr. Derrel Nip 11/13/17. She verbalized understanding. She agrees to a GI evaluation for whoever is recommended. She asks for a call to let her know who the physician will be.

## 2017-11-14 NOTE — Telephone Encounter (Signed)
Copied from [place]. Topic: Quick Communication - Office Called Patient >> Nov 13, 2017  3:46 PM Nanci Pina, LPN wrote: Reason for CRM: 11/10/17 phone note with results [facility] nurse may give results and advise.  >> Nov 14, 2017  9:34 AM Marin Olp L wrote: Patient calling back for Cologaurd results. No [facility] nurse available.

## 2017-11-15 ENCOUNTER — Other Ambulatory Visit: Payer: Self-pay | Admitting: Internal Medicine

## 2017-11-15 DIAGNOSIS — R195 Other fecal abnormalities: Secondary | ICD-10-CM | POA: Insufficient documentation

## 2017-11-15 DIAGNOSIS — Z1211 Encounter for screening for malignant neoplasm of colon: Secondary | ICD-10-CM

## 2017-11-15 NOTE — Progress Notes (Signed)
Referral to GI for positive cologuard test result is in progress

## 2017-11-16 NOTE — Telephone Encounter (Signed)
I recommend Job Founds,  [facility].  He is a Education officer, environmental who I trust and who performs almost all of our colonoscopies for patients.

## 2017-11-16 NOTE — Telephone Encounter (Signed)
Agrees to the GI referral.

## 2017-11-16 NOTE — Addendum Note (Signed)
Addended by: [name] on: 11/16/2017 04:22 PM   Modules accepted: Orders

## 2017-11-22 ENCOUNTER — Ambulatory Visit
Admission: RE | Admit: 2017-11-22 | Discharge: 2017-11-22 | Disposition: A | Discharge status: Home / Self Care | Payer: Medicare Other | Source: Ambulatory Visit | Attending: Internal Medicine | Admitting: Internal Medicine

## 2017-11-22 DIAGNOSIS — R928 Other abnormal and inconclusive findings on diagnostic imaging of breast: Secondary | ICD-10-CM | POA: Diagnosis not present

## 2017-11-22 DIAGNOSIS — R922 Inconclusive mammogram: Secondary | ICD-10-CM | POA: Diagnosis not present

## 2017-11-22 DIAGNOSIS — N6489 Other specified disorders of breast: Secondary | ICD-10-CM | POA: Insufficient documentation

## 2017-11-22 DIAGNOSIS — N6313 Unspecified lump in the right breast, lower outer quadrant: Secondary | ICD-10-CM | POA: Diagnosis not present

## 2017-11-28 NOTE — Telephone Encounter (Signed)
Spoke with Kayla Farmer and informed her that Dr. Derrel Nip has referred her to Dr. Job Founds at [facility]. The Kayla Farmer stated that she has not heard anything from them yet. Told the Kayla Farmer that if she doesn't here anything by Friday morning to give [place] a call so we can figure out what is going on. Kayla Farmer gave a verbal understanding.

## 2017-12-01 ENCOUNTER — Telehealth: Payer: Self-pay

## 2017-12-01 NOTE — Telephone Encounter (Signed)
Copied from [place]. Topic: Inquiry >> Dec 01, 2017  2:00 PM Kayla Farmer wrote: Reason for CRM: pt called to ask about why she was needing 2 appts regarding her colonoscopy, pt was given the info needed to contact Dr. Dwyane Luo office, call pt to advise

## 2017-12-04 NOTE — Telephone Encounter (Signed)
Do you know anything about this. Pt stated that she has the information needed to contact Dr. Dwyane Luo office.

## 2017-12-04 NOTE — Telephone Encounter (Signed)
She does not need two appts. There was two referrals put in for her to see Dr. Bary Castilla. I have cancelled one of them.

## 2018-01-09 ENCOUNTER — Ambulatory Visit: Payer: Medicare Other | Admitting: General Surgery

## 2018-01-09 ENCOUNTER — Encounter: Payer: Self-pay | Admitting: General Surgery

## 2018-01-09 VITALS — BP 124/78 | HR 90 | Resp 12 | Ht 63.0 in | Wt 181.0 lb

## 2018-01-09 DIAGNOSIS — R195 Other fecal abnormalities: Secondary | ICD-10-CM

## 2018-01-09 MED ORDER — POLYETHYLENE GLYCOL 3350 17 GM/SCOOP PO POWD: 1.0000 | Freq: Once | ORAL | 0 refills | Status: AC

## 2018-01-09 NOTE — Patient Instructions (Addendum)
The patient is aware to call back for any questions or concerns.  Colonoscopy, Adult A colonoscopy is an exam to look at the entire large intestine. During the exam, a lubricated, bendable tube is inserted into the anus and then passed into the rectum, colon, and other parts of the large intestine. A colonoscopy is often done as a part of normal colorectal screening or in response to certain symptoms, such as anemia, persistent diarrhea, abdominal pain, and blood in the stool. The exam can help screen for and diagnose medical problems, including:  Tumors.  Polyps.  Inflammation.  Areas of bleeding.  Tell a health care provider about:  Any allergies you have.  All medicines you are taking, including vitamins, herbs, eye drops, creams, and over-the-counter medicines.  Any problems you or family members have had with anesthetic medicines.  Any blood disorders you have.  Any surgeries you have had.  Any medical conditions you have.  Any problems you have had passing stool. What are the risks? Generally, this is a safe procedure. However, problems may occur, including:  Bleeding.  A tear in the intestine.  A reaction to medicines given during the exam.  Infection (rare).  What happens before the procedure? Eating and drinking restrictions Follow instructions from your health care provider about eating and drinking, which may include:  A few days before the procedure - follow a low-fiber diet. Avoid nuts, seeds, dried fruit, raw fruits, and vegetables.  1-3 days before the procedure - follow a clear liquid diet. Drink only clear liquids, such as clear broth or bouillon, black coffee or tea, clear juice, clear soft drinks or sports drinks, gelatin dessert, and popsicles. Avoid any liquids that contain red or purple dye.  On the day of the procedure - do not eat or drink anything during the 2 hours before the procedure, or within the time period that your health care provider  recommends.  Bowel prep If you were prescribed an oral bowel prep to clean out your colon:  Take it as told by your health care provider. Starting the day before your procedure, you will need to drink a large amount of medicated liquid. The liquid will cause you to have multiple loose stools until your stool is almost clear or light green.  If your skin or anus gets irritated from diarrhea, you may use these to relieve the irritation: ? Medicated wipes, such as adult wet wipes with aloe and vitamin E. ? A skin soothing-product like petroleum jelly.  If you vomit while drinking the bowel prep, take a break for up to 60 minutes and then begin the bowel prep again. If vomiting continues and you cannot take the bowel prep without vomiting, call your health care provider.  General instructions  Ask your health care provider about changing or stopping your regular medicines. This is especially important if you are taking diabetes medicines or blood thinners.  Plan to have someone take you home from the hospital or clinic. What happens during the procedure?  An IV tube may be inserted into one of your veins.  You will be given medicine to help you relax (sedative).  To reduce your risk of infection: ? Your health care team will wash or sanitize their hands. ? Your anal area will be washed with soap.  You will be asked to lie on your side with your knees bent.  Your health care provider will lubricate a long, thin, flexible tube. The tube will have a camera and  a light on the end.  The tube will be inserted into your anus.  The tube will be gently eased through your rectum and colon.  Air will be delivered into your colon to keep it open. You may feel some pressure or cramping.  The camera will be used to take images during the procedure.  A small tissue sample may be removed from your body to be examined under a microscope (biopsy). If any potential problems are found, the tissue  will be sent to a lab for testing.  If small polyps are found, your health care provider may remove them and have them checked for cancer cells.  The tube that was inserted into your anus will be slowly removed. The procedure may vary among health care providers and hospitals. What happens after the procedure?  Your blood pressure, heart rate, breathing rate, and blood oxygen level will be monitored until the medicines you were given have worn off.  Do not drive for 24 hours after the exam.  You may have a small amount of blood in your stool.  You may pass gas and have mild abdominal cramping or bloating due to the air that was used to inflate your colon during the exam.  It is up to you to get the results of your procedure. Ask your health care provider, or the department performing the procedure, when your results will be ready. This information is not intended to replace advice given to you by your health care provider. Make sure you discuss any questions you have with your health care provider. Document Released: 08/26/2000 Document Revised: 06/29/2016 Document Reviewed: 11/10/2015 Elsevier Interactive Patient Education  [employer].  The patient is scheduled for a Colonoscopy at [facility] on 01/24/18. They are aware to call the day before to get their arrival time. Miralax prescription has been sent into the patient's pharmacy. The patient is aware of date and instructions.

## 2018-01-09 NOTE — Progress Notes (Signed)
Patient ID: Kayla Farmer, female   DOB: 1966-06-06, 52 y.o.   MRN: [id number]  Chief Complaint  Patient presents with  . Other    positive cologuard    HPI Roshan Salamon is a 52 y.o. female.  Here today for colonoscopy discussion due to a positive cologuard referred by Dr Derrel Nip. She was last seen here in October 2016 for a breast evaluation. No GI issues. She does admit to 2-3 loose BM's daily in the mornings, no bleeding. Denies abdominal pain at this time. She states that last year she was having right abdominal tenderness and had a liver biopsy on 04-26-17.  HPI  Past Medical History:  Diagnosis Date  . Arthritis   . GERD (gastroesophageal reflux disease)   . History of cervical cancer   . Other inflammations of eyelids   . Positive colorectal cancer screening using Cologuard test 2019  . Sinus infection     Past Surgical History:  Procedure Laterality Date  . LIVER BIOPSY  04/26/2017    Family History  Problem Relation Age of Onset  . Coronary artery disease Mother   . Coronary artery disease Father        smoker  . Breast cancer Neg Hx   . Colon cancer Neg Hx     Social History Social History   Tobacco Use  . Smoking status: Current Every Day Smoker    Packs/day: 1.00    Years: 30.00    Pack years: 30.00    Types: Cigarettes  . Smokeless tobacco: Never Used  . Tobacco comment: smokes one pack of cigarettes per day since age 51, quit during pregnancies.  Substance Use Topics  . Alcohol use: No    Alcohol/week: 0.0 oz  . Drug use: No    Allergies  Allergen Reactions  . Codeine Itching    Current Outpatient Medications  Medication Sig Dispense Refill  . ALPRAZolam (XANAX) 0.25 MG tablet Take 1 tablet (0.25 mg total) by mouth 2 (two) times daily as needed for anxiety. 30 tablet 0  . busPIRone (BUSPAR) 30 MG tablet TAKE 1 TABLET BY MOUTH 3 TIMES DAILY 90 tablet 1  . omeprazole (PRILOSEC) 40 MG capsule take 1 capsule by mouth once daily 90 capsule 1  .  valACYclovir (VALTREX) 1000 MG tablet Take 1 tablet (1,000 mg total) by mouth 2 (two) times daily. 20 tablet 0  . venlafaxine XR (EFFEXOR-XR) 75 MG 24 hr capsule take 1 capsule by mouth once daily 90 capsule 1   No current facility-administered medications for this visit.     Review of Systems Review of Systems  Constitutional: Negative.   Respiratory: Negative.   Cardiovascular: Negative.     Blood pressure 124/78, pulse 90, resp. rate 12, height 5' 3"  (1.6 m), weight 181 lb (82.1 kg), last menstrual period 10/13/2014, SpO2 94 %.  Physical Exam Physical Exam  Constitutional: She is oriented to person, place, and time. She appears well-developed and well-nourished.  HENT:  Head: Normocephalic.  Eyes: Conjunctivae are normal.  Neck: Normal range of motion. Neck supple.  Cardiovascular: Normal rate, regular rhythm and normal heart sounds.  Pulmonary/Chest: Effort normal and breath sounds normal.  Neurological: She is alert and oriented to person, place, and time.  Skin: Skin is warm and dry.  Psychiatric: Her behavior is normal.    Data Reviewed November 02, 2017 Cologuard: Positive.  April 26, 2017 ultrasound-guided core liver biopsy:DIAGNOSIS:  A. LIVER, RIGHT LOBE; ULTRASOUND-GUIDED CORE BIOPSY:  - MILD STEATOSIS (5%).  -  FEATURES SUGGESTIVE OF A MILD HEPATITIC PROCESS, SEE COMMENT.   CBC and comprehensive metabolic panel of April 26, 2017 notable only for minimal elevation of the serum alkaline phosphatase.  Assessment    Positive Cologuard.    Plan    Colonoscopy with possible biopsy/polypectomy prn: Information regarding the procedure, including its potential risks and complications (including but not limited to perforation of the bowel, which may require emergency surgery to repair, and bleeding) was verbally given to the patient. Educational information regarding lower intestinal endoscopy was given to the patient. Written instructions for how to complete the  bowel prep using Miralax were provided. The importance of drinking ample fluids to avoid dehydration as a result of the prep emphasized.    HPI, Physical Exam, Assessment and Plan have been scribed under the direction and in the presence of Robert Bellow, MD. Karie Fetch, RN  I have completed the exam and reviewed the above documentation for accuracy and completeness.  I agree with the above.  Haematologist has been used and any errors in dictation or transcription are unintentional.  Hervey Ard, M.D., F.A.C.S.  The patient is scheduled for a Colonoscopy at [facility] on 01/24/18. They are aware to call the day before to get their arrival time. Miralax prescription has been sent into the patient's pharmacy. The patient is aware of date and instructions.  Documented by Caryl-Lyn Otis Brace LPN  Forest Gleason Kassiah Mccrory 01/10/2018, 5:35 PM

## 2018-01-23 ENCOUNTER — Encounter: Payer: Self-pay | Admitting: Emergency Medicine

## 2018-01-24 ENCOUNTER — Encounter: Payer: Self-pay | Admitting: Anesthesiology

## 2018-01-24 ENCOUNTER — Encounter
Admission: RE | Disposition: A | Discharge status: Home / Self Care | Payer: Self-pay | Source: Ambulatory Visit | Attending: General Surgery

## 2018-01-24 ENCOUNTER — Ambulatory Visit
Admission: RE | Admit: 2018-01-24 | Discharge: 2018-01-24 | Disposition: A | Discharge status: Home / Self Care | Payer: Medicare Other | Source: Ambulatory Visit | Attending: General Surgery | Admitting: General Surgery

## 2018-01-24 ENCOUNTER — Ambulatory Visit: Payer: Medicare Other | Admitting: Anesthesiology

## 2018-01-24 DIAGNOSIS — R195 Other fecal abnormalities: Secondary | ICD-10-CM

## 2018-01-24 DIAGNOSIS — F17210 Nicotine dependence, cigarettes, uncomplicated: Secondary | ICD-10-CM | POA: Diagnosis not present

## 2018-01-24 DIAGNOSIS — K621 Rectal polyp: Secondary | ICD-10-CM | POA: Insufficient documentation

## 2018-01-24 DIAGNOSIS — D122 Benign neoplasm of ascending colon: Secondary | ICD-10-CM | POA: Diagnosis not present

## 2018-01-24 DIAGNOSIS — Z8249 Family history of ischemic heart disease and other diseases of the circulatory system: Secondary | ICD-10-CM | POA: Insufficient documentation

## 2018-01-24 DIAGNOSIS — Z885 Allergy status to narcotic agent status: Secondary | ICD-10-CM | POA: Insufficient documentation

## 2018-01-24 DIAGNOSIS — D128 Benign neoplasm of rectum: Secondary | ICD-10-CM | POA: Diagnosis not present

## 2018-01-24 DIAGNOSIS — K635 Polyp of colon: Secondary | ICD-10-CM | POA: Diagnosis not present

## 2018-01-24 DIAGNOSIS — K921 Melena: Secondary | ICD-10-CM | POA: Insufficient documentation

## 2018-01-24 DIAGNOSIS — Z79899 Other long term (current) drug therapy: Secondary | ICD-10-CM | POA: Diagnosis not present

## 2018-01-24 DIAGNOSIS — K219 Gastro-esophageal reflux disease without esophagitis: Secondary | ICD-10-CM | POA: Diagnosis not present

## 2018-01-24 DIAGNOSIS — Z8541 Personal history of malignant neoplasm of cervix uteri: Secondary | ICD-10-CM | POA: Insufficient documentation

## 2018-01-24 DIAGNOSIS — F419 Anxiety disorder, unspecified: Secondary | ICD-10-CM | POA: Insufficient documentation

## 2018-01-24 DIAGNOSIS — M1990 Unspecified osteoarthritis, unspecified site: Secondary | ICD-10-CM | POA: Insufficient documentation

## 2018-01-24 DIAGNOSIS — D125 Benign neoplasm of sigmoid colon: Secondary | ICD-10-CM | POA: Diagnosis not present

## 2018-01-24 HISTORY — PX: COLONOSCOPY WITH PROPOFOL: SHX5780

## 2018-01-24 HISTORY — DX: Depression, unspecified: F32.A

## 2018-01-24 HISTORY — DX: Anxiety disorder, unspecified: F41.9

## 2018-01-24 HISTORY — DX: Major depressive disorder, single episode, unspecified: F32.9

## 2018-01-24 SURGERY — COLONOSCOPY WITH PROPOFOL
Anesthesia: General

## 2018-01-24 MED ORDER — PROPOFOL 500 MG/50ML IV EMUL
INTRAVENOUS | Status: AC
  Filled 2018-01-24: qty 50

## 2018-01-24 MED ORDER — PROPOFOL 10 MG/ML IV BOLUS
INTRAVENOUS | Status: AC
  Filled 2018-01-24: qty 40

## 2018-01-24 MED ORDER — PROPOFOL 500 MG/50ML IV EMUL
INTRAVENOUS | Status: DC | PRN
  Administered 2018-01-24: 140 ug/kg/min via INTRAVENOUS

## 2018-01-24 MED ORDER — LIDOCAINE HCL (CARDIAC) PF 100 MG/5ML IV SOSY
PREFILLED_SYRINGE | INTRAVENOUS | Status: DC | PRN
  Administered 2018-01-24: 50 mg via INTRAVENOUS

## 2018-01-24 MED ORDER — LIDOCAINE HCL (PF) 2 % IJ SOLN
INTRAMUSCULAR | Status: AC
  Filled 2018-01-24: qty 10

## 2018-01-24 MED ORDER — SODIUM CHLORIDE 0.9 % IV SOLN
INTRAVENOUS | Status: DC
  Administered 2018-01-24: 1000 mL via INTRAVENOUS

## 2018-01-24 MED ORDER — PROPOFOL 10 MG/ML IV BOLUS
INTRAVENOUS | Status: DC | PRN
  Administered 2018-01-24: 10 mg via INTRAVENOUS
  Administered 2018-01-24: 20 mg via INTRAVENOUS
  Administered 2018-01-24: 30 mg via INTRAVENOUS
  Administered 2018-01-24: 70 mg via INTRAVENOUS
  Administered 2018-01-24 (×2): 10 mg via INTRAVENOUS

## 2018-01-24 NOTE — Anesthesia Postprocedure Evaluation (Signed)
Anesthesia Post Note  Patient: Kayla Farmer  Procedure(s) Performed: COLONOSCOPY WITH PROPOFOL (N/A )  Patient location during evaluation: Endoscopy Anesthesia Type: General Level of consciousness: awake and alert and oriented Pain management: pain level controlled Vital Signs Assessment: post-procedure vital signs reviewed and stable Respiratory status: spontaneous breathing Cardiovascular status: blood pressure returned to baseline Anesthetic complications: no     Last Vitals:  Vitals:   01/24/18 0830 01/24/18 0840  BP: 104/64 99/67  Pulse: 82 74  Resp: 16 18  Temp:    SpO2: 100% 97%    Last Pain:  Vitals:   01/24/18 0820  TempSrc: Tympanic  PainSc:                  Jediah Horger

## 2018-01-24 NOTE — Anesthesia Preprocedure Evaluation (Signed)
Anesthesia Evaluation  Patient identified by MRN, date of birth, ID band Patient awake    Reviewed: Allergy & Precautions, NPO status , Patient's Chart, lab work & pertinent test results  Airway Mallampati: III  TM Distance: <3 FB     Dental   Pulmonary Current Smoker,    Pulmonary exam normal        Cardiovascular negative cardio ROS Normal cardiovascular exam     Neuro/Psych PSYCHIATRIC DISORDERS Anxiety    GI/Hepatic Neg liver ROS, GERD  ,  Endo/Other  negative endocrine ROS  Renal/GU negative Renal ROS  negative genitourinary   Musculoskeletal  (+) Arthritis , Osteoarthritis,    Abdominal Normal abdominal exam  (+)   Peds negative pediatric ROS (+)  Hematology negative hematology ROS (+)   Anesthesia Other Findings   Reproductive/Obstetrics                             Anesthesia Physical Anesthesia Plan  ASA: II  Anesthesia Plan: General   Post-op Pain Management:    Induction: Intravenous  PONV Risk Score and Plan:   Airway Management Planned: Nasal Cannula  Additional Equipment:   Intra-op Plan:   Post-operative Plan:   Informed Consent: I have reviewed the patients History and Physical, chart, labs and discussed the procedure including the risks, benefits and alternatives for the proposed anesthesia with the patient or authorized representative who has indicated his/her understanding and acceptance.   Dental advisory given  Plan Discussed with: CRNA and Surgeon  Anesthesia Plan Comments:         Anesthesia Quick Evaluation

## 2018-01-24 NOTE — Transfer of Care (Signed)
Immediate Anesthesia Transfer of Care Note  Patient: Kayla Farmer  Procedure(s) Performed: COLONOSCOPY WITH PROPOFOL (N/A )  Patient Location: PACU  Anesthesia Type:General  Level of Consciousness: sedated  Airway & Oxygen Therapy: Patient Spontanous Breathing and Patient connected to nasal cannula oxygen  Post-op Assessment: Report given to RN and Post -op Vital signs reviewed and stable  Post vital signs: Reviewed and stable  Last Vitals:  Vitals Value Taken Time  BP 103/64 01/24/2018  8:20 AM  Temp 36.1 C 01/24/2018  8:20 AM  Pulse 85 01/24/2018  8:20 AM  Resp 13 01/24/2018  8:20 AM  SpO2 99 % 01/24/2018  8:20 AM  Vitals shown include unvalidated device data.  Last Pain:  Vitals:   01/24/18 0820  TempSrc: Tympanic  PainSc:          Complications: No apparent anesthesia complications

## 2018-01-24 NOTE — Op Note (Signed)
[facility] Gastroenterology Patient Name: Kayla Farmer Procedure Date: 01/24/2018 7:16 AM MRN: [id number] Account #: [id number] Date of Birth: 1966-01-29 Admit Type: Outpatient Age: 52 Room: [facility] ENDO ROOM 1 Gender: Female Note Status: Finalized Procedure:            Colonoscopy Indications:          Positive Cologuard test Providers:            Robert Bellow, MD Referring MD:         Deborra Medina, MD (Referring MD) Medicines:            Monitored Anesthesia Care Complications:        No immediate complications. Procedure:            Pre-Anesthesia Assessment:                       - Prior to the procedure, a History and Physical was                        performed, and patient medications, allergies and                        sensitivities were reviewed. The patient's tolerance of                        previous anesthesia was reviewed.                       - The risks and benefits of the procedure and the                        sedation options and risks were discussed with the                        patient. All questions were answered and informed                        consent was obtained.                       After obtaining informed consent, the colonoscope was                        passed under direct vision. Throughout the procedure,                        the patient's blood pressure, pulse, and oxygen                        saturations were monitored continuously. The                        Colonoscope was introduced through the anus and                        advanced to the the terminal ileum. The colonoscopy was                        performed without difficulty. The patient tolerated the  procedure well. The quality of the bowel preparation                        was excellent. Findings:      A 12 mm polyp was found in the distal ascending colon. The polyp was on       the inner curve of the hepatic flexure and not  amenable to snare       polypectomy. The polyp was sessile. Biopsies were taken with a cold       forceps for histology.      Two sessile polyps were found in the sigmoid colon and mid sigmoid       colon. The polyps were 5 mm in size. These were biopsied with a hot       large-capacity forceps for histology.      A 14 mm polyp was found in the rectum. The polyp was semi-pedunculated.       The polyp was removed with a hot snare. Resection and retrieval were       complete. Impression:           - One 12 mm polyp in the distal ascending colon.                        Biopsied.                       - Two 5 mm polyps in the sigmoid colon and in the mid                        sigmoid colon. Biopsied.                       - One 14 mm polyp in the rectum, removed with a hot                        snare. Resected and retrieved. Recommendation:       - Telephone endoscopist for pathology results in 1 week. Procedure Code(s):    --- Professional ---                       [phone], Colonoscopy, flexible; with removal of tumor(s),                        polyp(s), or other lesion(s) by snare technique                       45384, 59, Colonoscopy, flexible; with removal of                        tumor(s), polyp(s), or other lesion(s) by hot biopsy                        forceps                       45380, 59, Colonoscopy, flexible; with biopsy, single                        or multiple Diagnosis Code(s):    --- Professional ---  D12.5, Benign neoplasm of sigmoid colon                       R19.5, Other fecal abnormalities                       K62.1, Rectal polyp                       D12.2, Benign neoplasm of ascending colon CPT copyright 2017 American Medical Association. All rights reserved. The codes documented in this report are preliminary and upon coder review may  be revised to meet current compliance requirements. Robert Bellow, MD 01/24/2018 8:18:28 AM This  report has been signed electronically. Number of Addenda: 0 Note Initiated On: 01/24/2018 7:16 AM Scope Withdrawal Time: 0 hours 25 minutes 31 seconds  Total Procedure Duration: 0 hours 33 minutes 34 seconds       [facility]

## 2018-01-24 NOTE — Anesthesia Post-op Follow-up Note (Signed)
Anesthesia QCDR form completed.        

## 2018-01-24 NOTE — H&P (Signed)
Tolerated prep well. Positive cologuard test. For colonoscopy.

## 2018-01-25 LAB — SURGICAL PATHOLOGY

## 2018-01-26 ENCOUNTER — Encounter: Payer: Self-pay | Admitting: Internal Medicine

## 2018-01-26 ENCOUNTER — Telehealth: Payer: Self-pay

## 2018-01-26 DIAGNOSIS — D126 Benign neoplasm of colon, unspecified: Secondary | ICD-10-CM | POA: Insufficient documentation

## 2018-01-26 NOTE — Telephone Encounter (Signed)
-----   Message from Robert Bellow, MD sent at 01/26/2018  8:57 AM EDT ----- Please notify the patient that all of her polyps were benign, but 2 of them were of a size and type that requires a follow-up exam in 3 years.  Please place and recalls.  Thank you ----- Message ----- From: Interface, Lab In Three Zero One Sent: 01/25/2018  11:47 AM To: Robert Bellow, MD

## 2018-01-26 NOTE — Telephone Encounter (Signed)
Notified patient as instructed, patient pleased. Discussed follow-up appointments, patient agrees, patient placed in recalls.

## 2018-04-01 ENCOUNTER — Other Ambulatory Visit: Payer: Self-pay | Admitting: Internal Medicine

## 2018-05-01 ENCOUNTER — Other Ambulatory Visit: Payer: Self-pay

## 2018-05-01 MED ORDER — OMEPRAZOLE 40 MG PO CPDR: 40.0000 mg | DELAYED_RELEASE_CAPSULE | Freq: Every day | ORAL | 1 refills | Status: DC

## 2018-05-01 NOTE — Telephone Encounter (Signed)
Last office visit 07/14/17 No office visit scheduled  To follow up 6 months

## 2018-07-11 ENCOUNTER — Other Ambulatory Visit: Payer: Self-pay | Admitting: Internal Medicine

## 2018-07-31 ENCOUNTER — Other Ambulatory Visit: Payer: Self-pay | Admitting: Internal Medicine

## 2018-09-13 ENCOUNTER — Other Ambulatory Visit: Payer: Self-pay | Admitting: Internal Medicine

## 2018-10-12 ENCOUNTER — Other Ambulatory Visit: Payer: Self-pay

## 2018-10-17 ENCOUNTER — Other Ambulatory Visit: Payer: Self-pay | Admitting: Internal Medicine

## 2018-10-17 MED ORDER — VENLAFAXINE HCL ER 75 MG PO CP24
ORAL_CAPSULE | ORAL | 0 refills | Status: DC
Start: 2018-10-17 — End: 2018-11-13

## 2018-10-17 MED ORDER — OMEPRAZOLE 40 MG PO CPDR: 40.0000 mg | DELAYED_RELEASE_CAPSULE | Freq: Every day | ORAL | 1 refills | Status: DC

## 2018-10-17 MED ORDER — BUSPIRONE HCL 30 MG PO TABS: 30.0000 mg | ORAL_TABLET | Freq: Three times a day (TID) | ORAL | 0 refills | Status: DC

## 2018-10-17 NOTE — Telephone Encounter (Signed)
Copied from [place] [id number]. Topic: Quick Communication - Rx Refill/Question >> Oct 17, 2018  2:39 PM Yvette Rack wrote: Medication: busPIRone (BUSPAR) 30 MG tablet    omeprazole (PRILOSEC) 40 MG capsule    venlafaxine XR (EFFEXOR-XR) 75 MG 24 hr capsule     Has the patient contacted their pharmacy? Yes. The Effexor  (Agent: If no, request that the patient contact the pharmacy for the refill.) (Agent: If yes, when and what did the pharmacy advise?)call provider they sent over request 2-3 times last week and twice this week   Preferred Pharmacy (with phone number or street name): [facility], [place] - [address] AT [facility] [phone] (Phone) [phone] (Fax)    Agent: Please be advised that RX refills may take up to 3 business days. We ask that you follow-up with your pharmacy.

## 2018-10-17 NOTE — Telephone Encounter (Signed)
Pt has made an appt for Monday 10-22-18 at 4:30 by TL at practice

## 2018-10-17 NOTE — Telephone Encounter (Signed)
Patient has called to schedule appointment- 10/22/18. Review for RF.

## 2018-10-17 NOTE — Telephone Encounter (Signed)
Requested medication (s) are due for refill today -yes  Requested medication (s) are on the active medication list -yes  Future visit scheduled -no  Last refill: Buspirone- 07/31/18                  Prilosec- 05/01/18                  Venlafaxine - 09/13/18  Notes to clinic: Patient is over due for appointment- she has had 30 day refills- with note to schedule appointment. Call patient today and left message to schedule appointment- sent for review.  Requested Prescriptions  Pending Prescriptions Disp Refills   busPIRone (BUSPAR) 30 MG tablet 30 tablet 0    Sig: Take 1 tablet (30 mg total) by mouth 3 (three) times daily.     Psychiatry: Anxiolytics/Hypnotics - Non-controlled Failed - 10/17/2018  2:47 PM      Failed - Valid encounter within last 6 months    Recent Outpatient Visits          1 year ago Hepatic steatosis   [facility] [place] [name], MD   1 year ago Generalized anxiety disorder   [facility] [place] [name], MD   1 year ago Encounter for preventive health examination   [place] [name], MD   3 years ago Encounter for preventive health examination   [place] [name], MD   3 years ago Tobacco abuse   [place] [name], MD      Future Appointments            In 5 days [name], MD [place]          omeprazole (PRILOSEC) 40 MG capsule 90 capsule 1    Sig: Take 1 capsule (40 mg total) by mouth daily.     Gastroenterology: Proton Pump Inhibitors Failed - 10/17/2018  2:47 PM      Failed - Valid encounter within last 12 months    Recent Outpatient Visits          1 year ago Hepatic steatosis   [facility] [place] [name], MD   1 year ago Generalized anxiety disorder   [facility] [place] [name], MD   1 year ago Encounter for preventive health examination    [place] [name], MD   3 years ago Encounter for preventive health examination   [place] [name], MD   3 years ago Tobacco abuse   [place] [name], MD      Future Appointments            In 5 days [name], MD [place]          venlafaxine XR (EFFEXOR-XR) 75 MG 24 hr capsule      Sig: Take by mouth daily.     Psychiatry: Antidepressants - SNRI - desvenlafaxine & venlafaxine Failed - 10/17/2018  2:47 PM      Failed - LDL in normal range and within 360 days    LDL Cholesterol  Date Value Ref Range Status  11/10/2016 161 (H) 0 - 99 mg/dL Final         Failed - Total Cholesterol in normal range and within 360 days    Cholesterol  Date Value Ref Range Status  11/10/2016 250 (H) 0 - 200 mg/dL Final    Comment:    ATP III Classification       Desirable:  < 200 mg/dL               Borderline High:  200 - 239 mg/dL          High:  > = 240 mg/dL         Failed - Triglycerides in normal range and within 360 days    Triglycerides  Date Value Ref Range Status  11/10/2016 131.0 0.0 - 149.0 mg/dL Final    Comment:    Normal:  <150 mg/dLBorderline High:  150 - 199 mg/dL         Failed - Valid encounter within last 6 months    Recent Outpatient Visits          1 year ago Hepatic steatosis   [facility] [place] [name], MD   1 year ago Generalized anxiety disorder   [facility] [place] [name], MD   1 year ago Encounter for preventive health examination   [place] [name], MD   3 years ago Encounter for preventive health examination   [place] [name], MD   3 years ago Tobacco abuse   [place] [name], MD      Future Appointments            In 5 days [name], MD [facility], [place] BP in normal range    BP Readings from Last 1 Encounters:  01/24/18 99/67          Requested Prescriptions  Pending Prescriptions Disp Refills   busPIRone (BUSPAR) 30 MG tablet 30 tablet 0    Sig: Take 1 tablet (30 mg total) by mouth 3 (three) times daily.     Psychiatry: Anxiolytics/Hypnotics - Non-controlled Failed - 10/17/2018  2:47 PM      Failed - Valid encounter within last 6 months    Recent Outpatient Visits          1 year ago Hepatic steatosis   [facility] [place] [name], MD   1 year ago Generalized anxiety disorder   [facility] [place] [name], MD   1 year ago Encounter for preventive health examination   [place] [name], MD   3 years ago Encounter for preventive health examination   [place] [name], MD   3 years ago Tobacco abuse   [place] [name], MD      Future Appointments            In 5 days [name], MD [place]          omeprazole (PRILOSEC) 40 MG capsule 90 capsule 1    Sig: Take 1 capsule (40 mg total) by mouth daily.     Gastroenterology: Proton Pump Inhibitors Failed - 10/17/2018  2:47 PM      Failed - Valid encounter within last 12 months    Recent Outpatient Visits          1 year ago Hepatic steatosis   [facility] [place] [name], MD   1 year ago Generalized anxiety disorder  [place] [name], MD   1 year ago Encounter for preventive health examination   [place] [name], MD   3 years ago Encounter for preventive health examination   [place] [name], MD   3 years ago Tobacco abuse   [place] [name], MD      Future Appointments            In 5 days [name], MD [place]           venlafaxine XR (EFFEXOR-XR) 75 MG 24 hr capsule      Sig: Take by mouth daily.     Psychiatry: Antidepressants - SNRI - desvenlafaxine & venlafaxine Failed - 10/17/2018  2:47 PM      Failed - LDL in normal range and within 360 days    LDL Cholesterol  Date Value Ref Range Status  11/10/2016 161 (H) 0 - 99 mg/dL Final         Failed - Total Cholesterol in normal range and within 360 days    Cholesterol  Date Value Ref Range Status  11/10/2016 250 (H) 0 - 200 mg/dL Final    Comment:    ATP III Classification       Desirable:  < 200 mg/dL               Borderline High:  200 - 239 mg/dL          High:  > = 240 mg/dL         Failed - Triglycerides in normal range and within 360 days    Triglycerides  Date Value Ref Range Status  11/10/2016 131.0 0.0 - 149.0 mg/dL Final    Comment:    Normal:  <150 mg/dLBorderline High:  150 - 199 mg/dL         Failed - Valid encounter within last 6 months    Recent Outpatient Visits          1 year ago Hepatic steatosis   [facility] [place] [name], MD   1 year ago Generalized anxiety disorder   [facility] [place] [name], MD   1 year ago Encounter for preventive health examination   [place] [name], MD   3 years ago Encounter for preventive health examination   [facility] [place] [name], MD   3 years ago Tobacco abuse   [facility]  [name], MD      Future Appointments            In 5 days [name], MD [facility], [place] BP in normal range    BP Readings from Last 1 Encounters:  01/24/18 99/67

## 2018-10-17 NOTE — Telephone Encounter (Signed)
Patient is out of Effexor ok to fill until appointment?

## 2018-10-22 ENCOUNTER — Ambulatory Visit (INDEPENDENT_AMBULATORY_CARE_PROVIDER_SITE_OTHER): Payer: Medicare Other | Admitting: Internal Medicine

## 2018-10-22 ENCOUNTER — Encounter: Payer: Self-pay | Admitting: Internal Medicine

## 2018-10-22 VITALS — BP 125/70 | HR 56 | Temp 98.4°F | Wt 186.0 lb

## 2018-10-22 DIAGNOSIS — F411 Generalized anxiety disorder: Secondary | ICD-10-CM

## 2018-10-22 DIAGNOSIS — R5383 Other fatigue: Secondary | ICD-10-CM | POA: Insufficient documentation

## 2018-10-22 DIAGNOSIS — H543 Unqualified visual loss, both eyes: Secondary | ICD-10-CM | POA: Diagnosis not present

## 2018-10-22 DIAGNOSIS — R635 Abnormal weight gain: Secondary | ICD-10-CM

## 2018-10-22 DIAGNOSIS — Z124 Encounter for screening for malignant neoplasm of cervix: Secondary | ICD-10-CM

## 2018-10-22 DIAGNOSIS — Z1239 Encounter for other screening for malignant neoplasm of breast: Secondary | ICD-10-CM | POA: Diagnosis not present

## 2018-10-22 DIAGNOSIS — K760 Fatty (change of) liver, not elsewhere classified: Secondary | ICD-10-CM

## 2018-10-22 DIAGNOSIS — R195 Other fecal abnormalities: Secondary | ICD-10-CM

## 2018-10-22 DIAGNOSIS — E669 Obesity, unspecified: Secondary | ICD-10-CM

## 2018-10-22 NOTE — Assessment & Plan Note (Signed)
Continue effexor and buspirone,  Encouraged to wean alprazolam off.  4 week  taper written

## 2018-10-22 NOTE — Assessment & Plan Note (Signed)
She has not lost weight and is not exercising regularly.  She continues to have RUQ pain of uncertain etiology, but appears to be MSK in origin.

## 2018-10-22 NOTE — Assessment & Plan Note (Signed)
She is advised to have 3 yr follow yp in 2022 for serrated adenomas found on diagnostic colonoscopy

## 2018-10-22 NOTE — Assessment & Plan Note (Signed)
Advised patient that if screening labs are normal,  A sleep study is advised.

## 2018-10-22 NOTE — Progress Notes (Signed)
Subjective:  Patient ID: Kayla Farmer, female    DOB: 06-02-1966  Age: 53 y.o. MRN: [id number]  CC: The primary encounter diagnosis was Weight gain. Diagnoses of Fatigue, unspecified type, Breast cancer screening, Pap smear for cervical cancer screening, Decreased vision in both eyes, Generalized anxiety disorder, Obesity (BMI 30.0-34.9), Positive colorectal cancer screening using Cologuard test, Hepatic steatosis, and Cervical cancer screening were also pertinent to this visit.  HPI Kayla Farmer presents for follow up on generalized anxiety and  obesity with fatty liver.  Patient has not been seen since Nov 2018.    GAD:  managed with effexor , buspirone and alprazolam .  Review of meds done.  She takes the alprazolam once daily in the morning.  ALL  REFEILLED FOR 30 DAYS ON FEB 5 . The risks and benefits of  Chronic  benzodiazepine use were discussed with patient today including increased risk of dementia,  Addiction, and seizures if abruptly withdrawn  .  Patient states that she is not sure if she is taking the alprazolam daily,  But is concerned about her absent mindedness ,  Lack of concentration and decreased memory.  She was  encouraged to reduce use of alprazolam gradually,  Starting with reduction of one dose to 1/2 tablet  Daily ,  And to use buspirone daily instead of alprazolam.   Obesity,  Has gained 6 lbs since last visit.  She wonders if it fluid retention,  Her personal be st was 160 in Sept 2016   Colon cancer screening: she was referred for positive Cologuard test:  4 polyps removed May 2019 , and 3 yr follow up advised for May 2022   MAMMOGRAM DUE . LETTER RECEIVED.  CC:  Multiple chronic complaints  1) Fatigue:  Feels tired all the time.  Denies daytime sleepiness,   But has no energy.  Told she snores loudly by husband.  Mouth dry in the mrning  Chronic insomnia,  wakes up several times per night,  Gest on Face book when can't sleep .  Does not exercise except for occasional  walks when the weather permits.   .  2) bilateral shoulder pain, hand pain,  All joints,    3) Feels swollen  weight gain noted   4) change in skin on bridge of nose,  Present for one year  without change ssible BCC on nose  5) Mouth dry in the morning .  Snores per husband. .  Doesn't get much sleep up several times per night.  Insomnia. On face book a lot at [employer] lately   [address] [facility],  For pap smear    Outpatient Medications Prior to Visit  Medication Sig Dispense Refill  . busPIRone (BUSPAR) 30 MG tablet Take 1 tablet (30 mg total) by mouth 3 (three) times daily. 30 tablet 0  . omeprazole (PRILOSEC) 40 MG capsule Take 1 capsule (40 mg total) by mouth daily. 90 capsule 1  . ALPRAZolam (XANAX) 0.25 MG tablet Take 1 tablet (0.25 mg total) by mouth 2 (two) times daily as needed for anxiety. (Patient not taking: Reported on 10/22/2018) 30 tablet 0  . valACYclovir (VALTREX) 1000 MG tablet Take 1 tablet (1,000 mg total) by mouth 2 (two) times daily. (Patient not taking: Reported on 10/22/2018) 20 tablet 0  . venlafaxine XR (EFFEXOR-XR) 75 MG 24 hr capsule TAKE 1 CAPSULE BY MOUTH EVERY DAY (Patient not taking: Reported on 10/22/2018) 30 capsule 0   No facility-administered medications prior to visit.  Review of Systems;  Patient denies headache, fevers, malaise, unintentional weight loss, skin rash, eye pain, sinus congestion and sinus pain, sore throat, dysphagia,  hemoptysis , cough, dyspnea, wheezing, chest pain, palpitations, orthopnea, edema, abdominal pain, nausea, melena, diarrhea, constipation, flank pain, dysuria, hematuria, urinary  Frequency, nocturia, numbness, tingling, seizures,  Focal weakness, Loss of consciousness,  Tremor, insomnia, depression, anxiety, and suicidal ideation.      Objective:  BP 125/70   Pulse (!) 56   Temp 98.4 F (36.9 C) (Oral)   Wt 186 lb (84.4 kg)   LMP 10/13/2014 (Approximate)   SpO2 100%   BMI 32.95 kg/m   BP Readings from  Last 3 Encounters:  10/22/18 125/70  01/24/18 99/67  01/09/18 124/78    Wt Readings from Last 3 Encounters:  10/22/18 186 lb (84.4 kg)  01/24/18 181 lb (82.1 kg)  01/09/18 181 lb (82.1 kg)    General appearance: alert, cooperative and appears stated age Ears: normal TM's and external ear canals both ears Throat: lips, mucosa, and tongue normal; teeth and gums normal Neck: no adenopathy, no carotid bruit, supple, symmetrical, trachea midline and thyroid not enlarged, symmetric, no tenderness/mass/nodules Back: symmetric, no curvature. ROM normal. No CVA tenderness. Lungs: clear to auscultation bilaterally Heart: regular rate and rhythm, S1, S2 normal, no murmur, click, rub or gallop Abdomen: soft, non-tender; bowel sounds normal; no masses,  no organomegaly Pulses: 2+ and symmetric Skin: Skin color, texture, turgor normal. No rashes or lesions Lymph nodes: Cervical, supraclavicular, and axillary nodes normal.  Lab Results  Component Value Date   HGBA1C 5.4 11/10/2016   HGBA1C 6.0 09/19/2014    Lab Results  Component Value Date   CREATININE 0.92 04/26/2017   CREATININE 0.84 11/10/2016   CREATININE 0.86 09/25/2015    Lab Results  Component Value Date   WBC 7.9 04/26/2017   HGB 12.5 04/26/2017   HCT 37.1 04/26/2017   PLT 220 04/26/2017   GLUCOSE 101 (H) 04/26/2017   CHOL 250 (H) 11/10/2016   TRIG 131.0 11/10/2016   HDL 62.70 11/10/2016   LDLDIRECT 157.0 11/10/2016   LDLCALC 161 (H) 11/10/2016   ALT 15 04/26/2017   AST 23 04/26/2017   NA 141 04/26/2017   K 3.9 04/26/2017   CL 108 04/26/2017   CREATININE 0.92 04/26/2017   BUN 19 04/26/2017   CO2 25 04/26/2017   TSH 1.50 11/10/2016   INR 0.94 04/26/2017   HGBA1C 5.4 11/10/2016    No results found.  Assessment & Plan:   Problem List Items Addressed This Visit    Positive colorectal cancer screening using Cologuard test    She is advised to have 3 yr follow yp in 2022 for serrated adenomas found on  diagnostic colonoscopy       Obesity (BMI 30.0-34.9)    With fatigue,  Snoring and fatty liver .I have addressed  BMI and recommended that patient start exercising with a goal of 30 minutes of aerobic exercise a minimum of 5 days per week. Screening for lipid disorders, thyroid and diabetes to be done today.        Hepatic steatosis    She has not lost weight and is not exercising regularly.  She continues to have RUQ pain of uncertain etiology, but appears to be MSK in origin.       Generalized anxiety disorder    Continue effexor and buspirone,  Encouraged to wean alprazolam off.  4 week  taper written      Fatigue  Advised patient that if screening labs are normal,  A sleep study is advised.       Relevant Orders   CBC with Differential/Platelet   Decreased vision in both eyes    She has had a formal eye exam and was told that she had developed presbyopia and to use readers.  She continues to complain of poor vision despite using readers .  Encouraged to return to eye doctor      Cervical cancer screening    Referral to [facility] for difficult exam.         Other Visit Diagnoses    Weight gain    -  Primary   Relevant Orders   TSH   Comprehensive metabolic panel   Hemoglobin A1c   Breast cancer screening       Relevant Orders   MM 3D SCREEN BREAST BILATERAL   Pap smear for cervical cancer screening       Relevant Orders   Ambulatory referral to Obstetrics / Gynecology      I am having Wardell Heath maintain her ALPRAZolam, valACYclovir, busPIRone, omeprazole, and venlafaxine XR.  No orders of the defined types were placed in this encounter.   There are no discontinued medications.  Follow-up: Return in about 6 months (around 04/22/2019).   [name], MD

## 2018-10-22 NOTE — Assessment & Plan Note (Signed)
Referral to [facility] for difficult exam.

## 2018-10-22 NOTE — Assessment & Plan Note (Signed)
She has had a formal eye exam and was told that she had developed presbyopia and to use readers.  She continues to complain of poor vision despite using readers .  Encouraged to return to eye doctor

## 2018-10-22 NOTE — Assessment & Plan Note (Signed)
With fatigue,  Snoring and fatty liver .I have addressed  BMI and recommended that patient start exercising with a goal of 30 minutes of aerobic exercise a minimum of 5 days per week. Screening for lipid disorders, thyroid and diabetes to be done today.

## 2018-10-22 NOTE — Patient Instructions (Addendum)
I want you to get off the alprazolam because it may be affecting your memory   Reduce the alprazolam dose to 1/2 tablet daily  For two weeks,     Then 1/2 tablet eery other day for two weeks  ,  Then stop   Continue buspirone and effexor   I am making a referral to Dr Vikki Ports Ward for your overdue PAP smear   Your annual mammogram has been ordered.  You are encouraged (required) to call to make your appointment at [place] [phone]

## 2018-10-23 LAB — COMPREHENSIVE METABOLIC PANEL
ALBUMIN: 4 g/dL (ref 3.5–5.2)
ALT: 15 U/L (ref 0–35)
AST: 17 U/L (ref 0–37)
Alkaline Phosphatase: 166 U/L — ABNORMAL HIGH (ref 39–117)
BUN: 21 mg/dL (ref 6–23)
CHLORIDE: 105 meq/L (ref 96–112)
CO2: 26 mEq/L (ref 19–32)
Calcium: 9.1 mg/dL (ref 8.4–10.5)
Creatinine, Ser: 0.83 mg/dL (ref 0.40–1.20)
GFR: 71.42 mL/min (ref >60.00)
GLUCOSE: 91 mg/dL (ref 70–99)
POTASSIUM: 3.9 meq/L (ref 3.5–5.1)
SODIUM: 139 meq/L (ref 135–145)
Total Bilirubin: 0.2 mg/dL (ref 0.2–1.2)
Total Protein: 6.9 g/dL (ref 6.0–8.3)

## 2018-10-23 LAB — CBC WITH DIFFERENTIAL/PLATELET
BASOS PCT: 1.7 % (ref 0.0–3.0)
Basophils Absolute: 0.2 10*3/uL — ABNORMAL HIGH (ref 0.0–0.1)
EOS PCT: 1.4 % (ref 0.0–5.0)
Eosinophils Absolute: 0.1 10*3/uL (ref 0.0–0.7)
HCT: 37.6 % (ref 36.0–46.0)
HEMOGLOBIN: 12.4 g/dL (ref 12.0–15.0)
LYMPHS ABS: 3.2 10*3/uL (ref 0.7–4.0)
Lymphocytes Relative: 32.4 % (ref 12.0–46.0)
MCHC: 33.1 g/dL (ref 30.0–36.0)
MCV: 84.2 fl (ref 78.0–100.0)
MONO ABS: 0.5 10*3/uL (ref 0.1–1.0)
Monocytes Relative: 5.4 % (ref 3.0–12.0)
NEUTROS PCT: 59.1 % (ref 43.0–77.0)
Neutro Abs: 5.9 10*3/uL (ref 1.4–7.7)
Platelets: 247 10*3/uL (ref 150.0–400.0)
RBC: 4.47 Mil/uL (ref 3.87–5.11)
RDW: 14.5 % (ref 11.5–15.5)
WBC: 9.9 10*3/uL (ref 4.0–10.5)

## 2018-10-23 LAB — TSH: TSH: 1.99 u[IU]/mL (ref 0.35–4.50)

## 2018-10-23 LAB — HEMOGLOBIN A1C: HEMOGLOBIN A1C: 5.8 % (ref 4.6–6.5)

## 2018-10-24 ENCOUNTER — Telehealth: Payer: Self-pay | Admitting: Internal Medicine

## 2018-10-24 NOTE — Telephone Encounter (Signed)
Copied from [place] [phone]. Topic: Quick Communication - See Telephone Encounter >> Oct 24, 2018 10:42 AM Vernona Rieger wrote: CRM for notification. See Telephone encounter for: 10/24/18.  Patient would like a call regarding her lab results form 2/10.

## 2018-10-24 NOTE — Telephone Encounter (Signed)
Spoke with pt in regards to her lab results and scheduled pt a 3 week repeat lab appt. Pt is aware of appt date and time.

## 2018-11-13 ENCOUNTER — Other Ambulatory Visit: Payer: Self-pay | Admitting: Internal Medicine

## 2018-11-13 ENCOUNTER — Telehealth: Payer: Self-pay | Admitting: Radiology

## 2018-11-13 DIAGNOSIS — K760 Fatty (change of) liver, not elsewhere classified: Secondary | ICD-10-CM

## 2018-11-13 DIAGNOSIS — R5383 Other fatigue: Secondary | ICD-10-CM

## 2018-11-13 DIAGNOSIS — E669 Obesity, unspecified: Secondary | ICD-10-CM

## 2018-11-13 MED ORDER — VENLAFAXINE HCL ER 75 MG PO CP24: ORAL_CAPSULE | ORAL | 0 refills | Status: DC

## 2018-11-13 MED ORDER — OMEPRAZOLE 40 MG PO CPDR: 40.0000 mg | DELAYED_RELEASE_CAPSULE | Freq: Every day | ORAL | 1 refills | Status: DC

## 2018-11-13 MED ORDER — BUSPIRONE HCL 30 MG PO TABS: 30.0000 mg | ORAL_TABLET | Freq: Three times a day (TID) | ORAL | 0 refills | Status: DC

## 2018-11-13 NOTE — Addendum Note (Signed)
Addended by: [name] on: 11/13/2018 09:16 AM   Modules accepted: Orders

## 2018-11-13 NOTE — Telephone Encounter (Signed)
Pt coming in for labs tomorrow, please place future orders. Thank you.  

## 2018-11-13 NOTE — Telephone Encounter (Signed)
Requested medication (s) are due for refill today:   Yes  Requested medication (s) are on the active medication list:   Yes  Future visit scheduled:   04/29/2019 with Dr. Derrel Nip   Last ordered: Buspar 10/17/2018  #30 0 refills;    Effexor XR 10/17/2018  #30  0 refills.  Prilosec 10/17/2018  #90  1 refill Sent to Dr. Derrel Nip because the Buspar and Effexor Rx filled for 30 days without refills until next OV.   The next OV is 04/29/2019 with Dr. Derrel Nip.  This exceeds 30 days.   There is a note that these are not to be refilled until next OV.  Information sent to Dr. Derrel Nip for disposition.   Requested Prescriptions  Pending Prescriptions Disp Refills   busPIRone (BUSPAR) 30 MG tablet 30 tablet 0    Sig: Take 1 tablet (30 mg total) by mouth 3 (three) times daily.     Psychiatry: Anxiolytics/Hypnotics - Non-controlled Passed - 11/13/2018 10:19 AM      Passed - Valid encounter within last 6 months    Recent Outpatient Visits          3 weeks ago Weight gain   [place] [name], MD   1 year ago Hepatic steatosis   [facility] [name], MD   1 year ago Generalized anxiety disorder   [facility] [place] [name], MD   2 years ago Encounter for preventive health examination   [place] [name], MD   3 years ago Encounter for preventive health examination   [facility] [name], MD      Future Appointments            In 5 months [name], MD [place]          omeprazole (PRILOSEC) 40 MG capsule 90 capsule 1    Sig: Take 1 capsule (40 mg total) by mouth daily.     Gastroenterology: Proton Pump Inhibitors Passed - 11/13/2018 10:19 AM      Passed - Valid encounter within last 12 months    Recent Outpatient Visits          3 weeks ago Weight gain   [place] [name], MD   1 year ago Hepatic steatosis   [facility] [place] [name], MD   1 year ago Generalized anxiety disorder   [facility] [place] [name], MD   2 years ago Encounter for preventive health examination   [place] [name], MD   3 years ago Encounter for preventive health examination   [place] [name], MD      Future Appointments            In 5 months Derrel Nip, Aris Everts, MD [place]          venlafaxine XR (EFFEXOR-XR) 75 MG 24 hr capsule 30 capsule 0    Sig: TAKE 1 CAPSULE BY MOUTH EVERY DAY     Psychiatry: Antidepressants - SNRI - desvenlafaxine & venlafaxine Failed - 11/13/2018 10:19 AM      Failed - LDL in normal range and within 360 days    LDL Cholesterol  Date Value Ref Range Status  11/10/2016 161 (H) 0 - 99 mg/dL Final         Failed -  Total Cholesterol in normal range and within 360 days    Cholesterol  Date Value Ref Range Status  11/10/2016 250 (H) 0 - 200 mg/dL Final    Comment:    ATP III Classification       Desirable:  < 200 mg/dL               Borderline High:  200 - 239 mg/dL          High:  > = 240 mg/dL         Failed - Triglycerides in normal range and within 360 days    Triglycerides  Date Value Ref Range Status  11/10/2016 131.0 0.0 - 149.0 mg/dL Final    Comment:    Normal:  <150 mg/dLBorderline High:  150 - 199 mg/dL         Passed - Last BP in normal range    BP Readings from Last 1 Encounters:  10/22/18 125/70         Passed - Valid encounter within last 6 months    Recent Outpatient Visits          3 weeks ago Weight gain   [place] [name], MD   1 year ago Hepatic steatosis   [facility] [place] [name], MD   1 year ago Generalized anxiety disorder   [facility] [place] [name], MD   2 years ago Encounter for preventive health examination   [place]  [name], MD   3 years ago Encounter for preventive health examination   [place], MD      Future Appointments            In 5 months Derrel Nip, Aris Everts, MD [facility], [facility]

## 2018-11-13 NOTE — Telephone Encounter (Signed)
Copied from [place] [phone]. Topic: Quick Communication - Rx Refill/Question >> Nov 13, 2018 10:10 AM Selinda Flavin B, NT wrote: **Patient calling and states that these were supposed to be sent to the pharmacy on 10/22/2018 when she had her appointment with Dr Derrel Nip. Please advise.**  Medication: busPIRone (BUSPAR) 30 MG tablet omeprazole (PRILOSEC) 40 MG capsule venlafaxine XR (EFFEXOR-XR) 75 MG 24 hr capsule  Has the patient contacted their pharmacy? Yes.   (Agent: If no, request that the patient contact the pharmacy for the refill.) (Agent: If yes, when and what did the pharmacy advise?)  Preferred Pharmacy (with phone number or street name): [id number] [address], [place] AT [facility]  Agent: Please be advised that RX refills may take up to 3 business days. We ask that you follow-up with your pharmacy.

## 2018-11-14 ENCOUNTER — Other Ambulatory Visit (INDEPENDENT_AMBULATORY_CARE_PROVIDER_SITE_OTHER): Payer: Medicare Other

## 2018-11-14 DIAGNOSIS — R5383 Other fatigue: Secondary | ICD-10-CM | POA: Diagnosis not present

## 2018-11-14 DIAGNOSIS — E669 Obesity, unspecified: Secondary | ICD-10-CM | POA: Diagnosis not present

## 2018-11-14 DIAGNOSIS — K760 Fatty (change of) liver, not elsewhere classified: Secondary | ICD-10-CM

## 2018-11-14 LAB — COMPREHENSIVE METABOLIC PANEL
ALT: 16 U/L (ref 0–35)
AST: 17 U/L (ref 0–37)
Albumin: 3.9 g/dL (ref 3.5–5.2)
Alkaline Phosphatase: 168 U/L — ABNORMAL HIGH (ref 39–117)
BUN: 21 mg/dL (ref 6–23)
CO2: 28 mEq/L (ref 19–32)
Calcium: 9.1 mg/dL (ref 8.4–10.5)
Chloride: 106 mEq/L (ref 96–112)
Creatinine, Ser: 0.9 mg/dL (ref 0.40–1.20)
GFR: 65.03 mL/min (ref >60.00)
Glucose, Bld: 96 mg/dL (ref 70–99)
Potassium: 4 mEq/L (ref 3.5–5.1)
SODIUM: 140 meq/L (ref 135–145)
Total Bilirubin: 0.4 mg/dL (ref 0.2–1.2)
Total Protein: 6.9 g/dL (ref 6.0–8.3)

## 2018-11-14 LAB — LIPID PANEL
CHOL/HDL RATIO: 4
Cholesterol: 217 mg/dL — ABNORMAL HIGH (ref 0–200)
HDL: 51.9 mg/dL (ref >39.00)
LDL Cholesterol: 145 mg/dL — ABNORMAL HIGH (ref 0–99)
NonHDL: 165.11
Triglycerides: 103 mg/dL (ref 0.0–149.0)
VLDL: 20.6 mg/dL (ref 0.0–40.0)

## 2018-11-14 LAB — TSH: TSH: 2.96 u[IU]/mL (ref 0.35–4.50)

## 2018-11-14 LAB — HEMOGLOBIN A1C: Hgb A1c MFr Bld: 5.9 % (ref 4.6–6.5)

## 2018-11-15 ENCOUNTER — Encounter: Payer: Self-pay | Admitting: Internal Medicine

## 2018-11-15 DIAGNOSIS — E785 Hyperlipidemia, unspecified: Secondary | ICD-10-CM | POA: Insufficient documentation

## 2018-11-26 ENCOUNTER — Encounter: Payer: Self-pay | Admitting: Internal Medicine

## 2018-12-12 ENCOUNTER — Other Ambulatory Visit: Payer: Self-pay

## 2018-12-12 MED ORDER — VENLAFAXINE HCL ER 75 MG PO CP24: ORAL_CAPSULE | ORAL | 1 refills | Status: DC

## 2019-01-07 ENCOUNTER — Emergency Department: Payer: Medicare Other

## 2019-01-07 ENCOUNTER — Inpatient Hospital Stay
Admission: EM | Admit: 2019-01-07 | Discharge: 2019-01-08 | DRG: 247 | Disposition: A | Discharge status: Home / Self Care | Payer: Medicare Other | Attending: Internal Medicine | Admitting: Internal Medicine

## 2019-01-07 ENCOUNTER — Other Ambulatory Visit: Payer: Self-pay

## 2019-01-07 ENCOUNTER — Encounter
Admission: EM | Disposition: A | Discharge status: Home / Self Care | Payer: Self-pay | Source: Home / Self Care | Attending: Internal Medicine

## 2019-01-07 DIAGNOSIS — Z79899 Other long term (current) drug therapy: Secondary | ICD-10-CM

## 2019-01-07 DIAGNOSIS — E785 Hyperlipidemia, unspecified: Secondary | ICD-10-CM

## 2019-01-07 DIAGNOSIS — I249 Acute ischemic heart disease, unspecified: Secondary | ICD-10-CM | POA: Diagnosis not present

## 2019-01-07 DIAGNOSIS — K219 Gastro-esophageal reflux disease without esophagitis: Secondary | ICD-10-CM | POA: Diagnosis present

## 2019-01-07 DIAGNOSIS — F418 Other specified anxiety disorders: Secondary | ICD-10-CM | POA: Diagnosis present

## 2019-01-07 DIAGNOSIS — Z8249 Family history of ischemic heart disease and other diseases of the circulatory system: Secondary | ICD-10-CM | POA: Diagnosis not present

## 2019-01-07 DIAGNOSIS — R079 Chest pain, unspecified: Secondary | ICD-10-CM | POA: Diagnosis not present

## 2019-01-07 DIAGNOSIS — I214 Non-ST elevation (NSTEMI) myocardial infarction: Principal | ICD-10-CM

## 2019-01-07 DIAGNOSIS — R7301 Impaired fasting glucose: Secondary | ICD-10-CM | POA: Diagnosis present

## 2019-01-07 DIAGNOSIS — I2510 Atherosclerotic heart disease of native coronary artery without angina pectoris: Secondary | ICD-10-CM | POA: Diagnosis present

## 2019-01-07 DIAGNOSIS — F17210 Nicotine dependence, cigarettes, uncomplicated: Secondary | ICD-10-CM | POA: Diagnosis present

## 2019-01-07 DIAGNOSIS — Z8541 Personal history of malignant neoplasm of cervix uteri: Secondary | ICD-10-CM

## 2019-01-07 DIAGNOSIS — I219 Acute myocardial infarction, unspecified: Secondary | ICD-10-CM | POA: Diagnosis not present

## 2019-01-07 DIAGNOSIS — Z9861 Coronary angioplasty status: Secondary | ICD-10-CM | POA: Diagnosis not present

## 2019-01-07 DIAGNOSIS — R7989 Other specified abnormal findings of blood chemistry: Secondary | ICD-10-CM | POA: Diagnosis not present

## 2019-01-07 HISTORY — PX: LEFT HEART CATH AND CORONARY ANGIOGRAPHY: CATH118249

## 2019-01-07 HISTORY — PX: CORONARY STENT INTERVENTION: CATH118234

## 2019-01-07 LAB — CBC
HCT: 38.1 % (ref 36.0–46.0)
Hemoglobin: 12.4 g/dL (ref 12.0–15.0)
MCH: 27.2 pg (ref 26.0–34.0)
MCHC: 32.5 g/dL (ref 30.0–36.0)
MCV: 83.6 fL (ref 80.0–100.0)
Platelets: 242 10*3/uL (ref 150–400)
RBC: 4.56 MIL/uL (ref 3.87–5.11)
RDW: 14.6 % (ref 11.5–15.5)
WBC: 10.4 10*3/uL (ref 4.0–10.5)
nRBC: 0 % (ref 0.0–0.2)

## 2019-01-07 LAB — BASIC METABOLIC PANEL
Anion gap: 8 (ref 5–15)
BUN: 23 mg/dL — ABNORMAL HIGH (ref 6–20)
CO2: 24 mmol/L (ref 22–32)
Calcium: 8.9 mg/dL (ref 8.9–10.3)
Chloride: 105 mmol/L (ref 98–111)
Creatinine, Ser: 0.91 mg/dL (ref 0.44–1.00)
GFR calc Af Amer: >60 mL/min (ref >60)
GFR calc non Af Amer: >60 mL/min (ref >60)
Glucose, Bld: 120 mg/dL — ABNORMAL HIGH (ref 70–99)
Potassium: 4.2 mmol/L (ref 3.5–5.1)
Sodium: 137 mmol/L (ref 135–145)

## 2019-01-07 LAB — TROPONIN I
Troponin I: 0.19 ng/mL (ref <0.03)
Troponin I: 2.78 ng/mL (ref <0.03)
Troponin I: 3.27 ng/mL (ref <0.03)

## 2019-01-07 LAB — APTT: aPTT: 27 seconds (ref 24–36)

## 2019-01-07 LAB — POCT ACTIVATED CLOTTING TIME: Activated Clotting Time: 274 seconds

## 2019-01-07 LAB — PROTIME-INR
INR: 1 (ref 0.8–1.2)
Prothrombin Time: 12.6 seconds (ref 11.4–15.2)

## 2019-01-07 SURGERY — LEFT HEART CATH AND CORONARY ANGIOGRAPHY
Anesthesia: Moderate Sedation

## 2019-01-07 MED ORDER — VENLAFAXINE HCL ER 75 MG PO CP24: 75.0000 mg | ORAL_CAPSULE | Freq: Every day | ORAL | Status: DC

## 2019-01-07 MED ORDER — FENTANYL CITRATE (PF) 100 MCG/2ML IJ SOLN
INTRAMUSCULAR | Status: DC | PRN
  Administered 2019-01-07: 25 ug via INTRAVENOUS
  Administered 2019-01-07: 50 ug via INTRAVENOUS

## 2019-01-07 MED ORDER — HEPARIN SODIUM (PORCINE) 1000 UNIT/ML IJ SOLN
INTRAMUSCULAR | Status: AC
  Filled 2019-01-07: qty 1

## 2019-01-07 MED ORDER — PANTOPRAZOLE SODIUM 40 MG PO TBEC
40.0000 mg | DELAYED_RELEASE_TABLET | Freq: Every day | ORAL | Status: DC
  Administered 2019-01-08: 40 mg via ORAL
  Filled 2019-01-07: qty 1

## 2019-01-07 MED ORDER — BUSPIRONE HCL 15 MG PO TABS: 30.0000 mg | ORAL_TABLET | Freq: Every day | ORAL | Status: DC

## 2019-01-07 MED ORDER — FENTANYL CITRATE (PF) 100 MCG/2ML IJ SOLN
INTRAMUSCULAR | Status: AC
  Filled 2019-01-07: qty 2

## 2019-01-07 MED ORDER — ASPIRIN 81 MG PO CHEW
162.0000 mg | CHEWABLE_TABLET | Freq: Once | ORAL | Status: AC
  Administered 2019-01-07: 09:00:00 162 mg via ORAL
  Filled 2019-01-07: qty 2

## 2019-01-07 MED ORDER — SODIUM CHLORIDE 0.9% FLUSH: 3.0000 mL | INTRAVENOUS | Status: DC | PRN

## 2019-01-07 MED ORDER — NITROGLYCERIN 0.4 MG SL SUBL: 0.4000 mg | SUBLINGUAL_TABLET | SUBLINGUAL | Status: DC | PRN

## 2019-01-07 MED ORDER — ONDANSETRON HCL 4 MG PO TABS: 4.0000 mg | ORAL_TABLET | Freq: Four times a day (QID) | ORAL | Status: DC | PRN

## 2019-01-07 MED ORDER — BUSPIRONE HCL 15 MG PO TABS
30.0000 mg | ORAL_TABLET | Freq: Every day | ORAL | Status: DC
  Administered 2019-01-07: 30 mg via ORAL
  Filled 2019-01-07 (×2): qty 2

## 2019-01-07 MED ORDER — SODIUM CHLORIDE 0.9% FLUSH: 3.0000 mL | Freq: Two times a day (BID) | INTRAVENOUS | Status: DC

## 2019-01-07 MED ORDER — ONDANSETRON HCL 4 MG/2ML IJ SOLN
INTRAMUSCULAR | Status: AC
  Filled 2019-01-07: qty 2

## 2019-01-07 MED ORDER — SODIUM CHLORIDE 0.9 % IV SOLN: 250.0000 mL | INTRAVENOUS | Status: DC | PRN

## 2019-01-07 MED ORDER — SODIUM CHLORIDE 0.9 % WEIGHT BASED INFUSION
3.0000 mL/kg/h | INTRAVENOUS | Status: DC
  Administered 2019-01-07: 3 mL/kg/h via INTRAVENOUS

## 2019-01-07 MED ORDER — VERAPAMIL HCL 2.5 MG/ML IV SOLN
INTRAVENOUS | Status: DC | PRN
  Administered 2019-01-07: 2.5 mg via INTRA_ARTERIAL

## 2019-01-07 MED ORDER — HEPARIN (PORCINE) 25000 UT/250ML-% IV SOLN
850.0000 [IU]/h | INTRAVENOUS | Status: DC
  Administered 2019-01-07: 09:00:00 850 [IU]/h via INTRAVENOUS
  Filled 2019-01-07: qty 250

## 2019-01-07 MED ORDER — VENLAFAXINE HCL ER 75 MG PO CP24
75.0000 mg | ORAL_CAPSULE | Freq: Every day | ORAL | Status: DC
  Administered 2019-01-07: 75 mg via ORAL
  Filled 2019-01-07: qty 1

## 2019-01-07 MED ORDER — HEPARIN (PORCINE) IN NACL 1000-0.9 UT/500ML-% IV SOLN
INTRAVENOUS | Status: DC | PRN
  Administered 2019-01-07 (×2): 500 mL

## 2019-01-07 MED ORDER — ATORVASTATIN CALCIUM 20 MG PO TABS
80.0000 mg | ORAL_TABLET | Freq: Every day | ORAL | Status: DC
  Administered 2019-01-07: 80 mg via ORAL
  Filled 2019-01-07: qty 4
  Filled 2019-01-07: qty 1

## 2019-01-07 MED ORDER — SODIUM CHLORIDE 0.9 % WEIGHT BASED INFUSION
1.0000 mL/kg/h | INTRAVENOUS | Status: AC
  Administered 2019-01-07: 13:00:00 1 mL/kg/h via INTRAVENOUS

## 2019-01-07 MED ORDER — HEPARIN SODIUM (PORCINE) 1000 UNIT/ML IJ SOLN
INTRAMUSCULAR | Status: DC | PRN
  Administered 2019-01-07: 4000 [IU] via INTRAVENOUS

## 2019-01-07 MED ORDER — ONDANSETRON HCL 4 MG/2ML IJ SOLN
4.0000 mg | Freq: Four times a day (QID) | INTRAMUSCULAR | Status: DC | PRN
  Administered 2019-01-07: 4 mg via INTRAVENOUS

## 2019-01-07 MED ORDER — HEPARIN BOLUS VIA INFUSION
4000.0000 [IU] | Freq: Once | INTRAVENOUS | Status: AC
  Administered 2019-01-07: 10:00:00 4000 [IU] via INTRAVENOUS
  Filled 2019-01-07: qty 4000

## 2019-01-07 MED ORDER — METOPROLOL TARTRATE 25 MG PO TABS
12.5000 mg | ORAL_TABLET | Freq: Two times a day (BID) | ORAL | Status: DC
  Administered 2019-01-07 (×2): 12.5 mg via ORAL
  Filled 2019-01-07 (×2): qty 1

## 2019-01-07 MED ORDER — ASPIRIN EC 81 MG PO TBEC
81.0000 mg | DELAYED_RELEASE_TABLET | Freq: Every day | ORAL | Status: DC
  Administered 2019-01-08: 81 mg via ORAL
  Filled 2019-01-07: qty 1

## 2019-01-07 MED ORDER — TICAGRELOR 90 MG PO TABS
90.0000 mg | ORAL_TABLET | Freq: Two times a day (BID) | ORAL | Status: DC
  Administered 2019-01-07 – 2019-01-08 (×2): 90 mg via ORAL
  Filled 2019-01-07 (×2): qty 1

## 2019-01-07 MED ORDER — SODIUM CHLORIDE 0.9% FLUSH
3.0000 mL | Freq: Once | INTRAVENOUS | Status: AC
  Administered 2019-01-07: 09:00:00 3 mL via INTRAVENOUS

## 2019-01-07 MED ORDER — ACETAMINOPHEN 325 MG PO TABS: 650.0000 mg | ORAL_TABLET | Freq: Four times a day (QID) | ORAL | Status: DC | PRN

## 2019-01-07 MED ORDER — VERAPAMIL HCL 2.5 MG/ML IV SOLN
INTRAVENOUS | Status: AC
  Filled 2019-01-07: qty 2

## 2019-01-07 MED ORDER — ACETAMINOPHEN 650 MG RE SUPP: 650.0000 mg | Freq: Four times a day (QID) | RECTAL | Status: DC | PRN

## 2019-01-07 MED ORDER — HEPARIN (PORCINE) IN NACL 1000-0.9 UT/500ML-% IV SOLN
INTRAVENOUS | Status: AC
  Filled 2019-01-07: qty 1000

## 2019-01-07 MED ORDER — ASPIRIN 81 MG PO CHEW
81.0000 mg | CHEWABLE_TABLET | Freq: Once | ORAL | Status: AC
  Administered 2019-01-07: 09:00:00 81 mg via ORAL
  Filled 2019-01-07: qty 1

## 2019-01-07 MED ORDER — TICAGRELOR 90 MG PO TABS
ORAL_TABLET | ORAL | Status: AC
  Filled 2019-01-07: qty 2

## 2019-01-07 MED ORDER — MIDAZOLAM HCL 2 MG/2ML IJ SOLN
INTRAMUSCULAR | Status: AC
  Filled 2019-01-07: qty 2

## 2019-01-07 MED ORDER — NITROGLYCERIN 1 MG/10 ML FOR IR/CATH LAB
INTRA_ARTERIAL | Status: AC
  Filled 2019-01-07: qty 10

## 2019-01-07 MED ORDER — MIDAZOLAM HCL 2 MG/2ML IJ SOLN
INTRAMUSCULAR | Status: DC | PRN
  Administered 2019-01-07: 1 mg via INTRAVENOUS

## 2019-01-07 MED ORDER — TICAGRELOR 90 MG PO TABS
ORAL_TABLET | ORAL | Status: DC | PRN
  Administered 2019-01-07: 180 mg via ORAL

## 2019-01-07 MED ORDER — IOPAMIDOL (ISOVUE-300) INJECTION 61%
INTRAVENOUS | Status: DC | PRN
  Administered 2019-01-07: 14:00:00 185 mL via INTRA_ARTERIAL

## 2019-01-07 MED ORDER — NICOTINE 21 MG/24HR TD PT24: 21.0000 mg | MEDICATED_PATCH | Freq: Every day | TRANSDERMAL | Status: DC | PRN

## 2019-01-07 MED ORDER — NITROGLYCERIN 1 MG/10 ML FOR IR/CATH LAB
INTRA_ARTERIAL | Status: DC | PRN
  Administered 2019-01-07: 200 ug via INTRACORONARY

## 2019-01-07 MED ORDER — SODIUM CHLORIDE 0.9 % WEIGHT BASED INFUSION: 1.0000 mL/kg/h | INTRAVENOUS | Status: DC

## 2019-01-07 SURGICAL SUPPLY — 17 items
BALLN MINITREK RX 2.0X12 (BALLOONS) ×3
BALLN ~~LOC~~ TREK RX 2.5X15 (BALLOONS) ×3
BALLOON MINITREK RX 2.0X12 (BALLOONS) ×1 IMPLANT
BALLOON ~~LOC~~ TREK RX 2.5X15 (BALLOONS) ×1 IMPLANT
CATH INFINITI 5 FR JL3.5 (CATHETERS) ×3 IMPLANT
CATH INFINITI 5FR JK (CATHETERS) ×3 IMPLANT
CATH INFINITI JR4 5F (CATHETERS) ×3 IMPLANT
CATH LAUNCHER 6FR EBU3.5 (CATHETERS) ×3 IMPLANT
CATH LAUNCHER 6FR JR4 (CATHETERS) ×3 IMPLANT
DEVICE INFLAT 30 PLUS (MISCELLANEOUS) ×3 IMPLANT
DEVICE RAD TR BAND REGULAR (VASCULAR PRODUCTS) ×3 IMPLANT
GLIDESHEATH SLEND SS 6F .021 (SHEATH) ×3 IMPLANT
KIT MANI 3VAL PERCEP (MISCELLANEOUS) ×3 IMPLANT
PACK CARDIAC CATH (CUSTOM PROCEDURE TRAY) ×3 IMPLANT
STENT RESOLUTE ONYX 2.25X22 (Permanent Stent) ×3 IMPLANT
WIRE ROSEN-J .035X260CM (WIRE) ×3 IMPLANT
WIRE RUNTHROUGH .014X180CM (WIRE) ×3 IMPLANT

## 2019-01-07 NOTE — Consult Note (Signed)
[place] for heparin Indication: chest pain/ACS  Allergies  Allergen Reactions  . Codeine Itching    Patient Measurements: Height: 5' 3"  (160 cm) Weight: 190 lb (86.2 kg) IBW/kg (Calculated) : 52.4 Heparin Dosing Weight: 71.7 kg   Vital Signs: Temp: 98.1 F (36.7 C) (04/27 0803) Temp Source: Oral (04/27 0803) BP: 132/78 (04/27 0834) Pulse Rate: 79 (04/27 0834)  Labs: Recent Labs    01/07/19 0804  HGB 12.4  HCT 38.1  PLT 242  CREATININE 0.91  TROPONINI 0.19*    Estimated Creatinine Clearance: 72.7 mL/min (by C-G formula based on SCr of 0.91 mg/dL).   Medical History: Past Medical History:  Diagnosis Date  . Anxiety   . Arthritis   . Depression   . GERD (gastroesophageal reflux disease)   . History of cervical cancer   . Other inflammations of eyelids   . Positive colorectal cancer screening using Cologuard test 2019  . Sinus infection     Medications:  (Not in a hospital admission)  Scheduled:  Infusions:  PRN:  Anti-infectives (From admission, onward)   None      Assessment: Trop elevated 0.19. Pharmacy consulted to start heparin. No DOAC PTA noted.   Goal of Therapy:  Heparin level 0.3-0.7 units/ml Monitor platelets by anticoagulation protocol: Yes   Plan:  Give 4000 units bolus x 1 Start heparin infusion at 850 units/hr Check anti-Xa level in 6 hours and daily while on heparin Continue to monitor H&H and platelets  Oswald Hillock, PharmD, BCPS Clinical Pharmacist  01/07/2019,9:03 AM

## 2019-01-07 NOTE — ED Provider Notes (Signed)
[facility] Emergency Department Provider Note ____________________________________________   First MD Initiated Contact with Patient 01/07/19 [phone]     (approximate)  I have reviewed the triage vital signs and the nursing notes.   HISTORY  Chief Complaint Chest Pain    HPI Kayla Farmer is a 53 y.o. female with PMH as noted below who presents with chest pain, acute onset around 3 AM and which awoke her from sleep.  She describes the pain as pressure-like and mainly substernal although it also radiates to the left arm and her arm feels sore.  She reports mild lightheadedness but denies shortness of breath.  She denies any positional nature of the pain.  She states she took a baby aspirin around 4 AM and had no relief.  She has no prior history of this pain.  She denies any leg pain or swelling.  She has no cough or fever.  Past Medical History:  Diagnosis Date  . Anxiety   . Arthritis   . Depression   . GERD (gastroesophageal reflux disease)   . History of cervical cancer   . Other inflammations of eyelids   . Positive colorectal cancer screening using Cologuard test 2019  . Sinus infection     Patient Active Problem List   Diagnosis Date Noted  . Hyperlipidemia LDL goal <100 11/15/2018  . Fatigue 10/22/2018  . Serrated adenoma of colon 01/26/2018  . Positive colorectal cancer screening using Cologuard test 11/15/2017  . Abnormal mammogram of right breast 11/13/2017  . Screening for colon cancer 09/13/2017  . Prediabetes 11/12/2016  . Obesity (BMI 30.0-34.9) 11/12/2016  . Hepatic steatosis 09/27/2015  . Encounter for preventive health examination 09/27/2015  . Tobacco abuse 06/08/2015  . Tobacco abuse counseling 06/08/2015  . Pain in joint, shoulder region 06/08/2015  . Decreased vision in both eyes 05/28/2013  . Irritable bowel syndrome 05/28/2013  . Generalized anxiety disorder 05/10/2011  . Cervical cancer screening 05/10/2011  . Screening  for breast cancer 05/10/2011  . Menometrorrhagia 05/10/2011    Past Surgical History:  Procedure Laterality Date  . COLONOSCOPY WITH PROPOFOL N/A 01/24/2018   Procedure: COLONOSCOPY WITH PROPOFOL;  Surgeon: Robert Bellow, MD;  Location: [facility] ENDOSCOPY;  Service: Endoscopy;  Laterality: N/A;  . csection x2    . LIVER BIOPSY  04/26/2017  . TONSILLECTOMY      Prior to Admission medications   Medication Sig Start Date End Date Taking? Authorizing Provider  busPIRone (BUSPAR) 30 MG tablet Take 1 tablet (30 mg total) by mouth 3 (three) times daily. Patient taking differently: Take 30 mg by mouth daily.  11/13/18  Yes [name], MD  omeprazole (PRILOSEC) 40 MG capsule Take 1 capsule (40 mg total) by mouth daily. 11/13/18  Yes [name], MD  venlafaxine XR (EFFEXOR-XR) 75 MG 24 hr capsule TAKE 1 CAPSULE BY MOUTH EVERY DAY 12/12/18  Yes [name], MD    Allergies Codeine  Family History  Problem Relation Age of Onset  . Coronary artery disease Mother   . Coronary artery disease Father        smoker  . Breast cancer Neg Hx   . Colon cancer Neg Hx     Social History Social History   Tobacco Use  . Smoking status: Current Every Day Smoker    Packs/day: 1.00    Years: 30.00    Pack years: 30.00    Types: Cigarettes  . Smokeless tobacco: Never Used  . Tobacco  comment: smokes one pack of cigarettes per day since age 47, quit during pregnancies.  Substance Use Topics  . Alcohol use: No    Alcohol/week: 0.0 standard drinks  . Drug use: No    Review of Systems  Constitutional: No fever. Eyes: No redness. ENT: No neck pain. Cardiovascular: Positive for chest pain. Respiratory: Denies shortness of breath. Gastrointestinal: No vomiting. Genitourinary: Negative for flank pain.  Musculoskeletal: Negative for back pain. Skin: Negative for rash. Neurological: Negative for headaches, focal weakness or  numbness.   ____________________________________________   PHYSICAL EXAM:  VITAL SIGNS: ED Triage Vitals  Enc Vitals Group     BP 01/07/19 0803 (!) 146/98     Pulse Rate 01/07/19 0803 83     Resp 01/07/19 0834 18     Temp 01/07/19 0803 98.1 F (36.7 C)     Temp Source 01/07/19 0803 Oral     SpO2 01/07/19 0803 97 %     Weight 01/07/19 0803 190 lb (86.2 kg)     Height 01/07/19 0803 5' 3"  (1.6 m)     Head Circumference --      Peak Flow --      Pain Score 01/07/19 0806 9     Pain Loc --      Pain Edu? --      Excl. in [place]? --     Constitutional: Alert and oriented.  Relatively well appearing and in no acute distress. Eyes: Conjunctivae are normal.  Head: Atraumatic. Nose: No congestion/rhinnorhea. Mouth/Throat: Mucous membranes are moist.   Neck: Normal range of motion.  Cardiovascular: Normal rate, regular rhythm. Grossly normal heart sounds.  Good peripheral circulation. Respiratory: Normal respiratory effort.  No retractions. Lungs CTAB. Gastrointestinal: No distention.  Musculoskeletal: No lower extremity edema.  No calf or popliteal swelling or tenderness.  Extremities warm and well perfused.  Neurologic:  Normal speech and language. No gross focal neurologic deficits are appreciated.  Skin:  Skin is warm and dry. No rash noted. Psychiatric: Mood and affect are normal. Speech and behavior are normal.  ____________________________________________   LABS (all labs ordered are listed, but only abnormal results are displayed)  Labs Reviewed  BASIC METABOLIC PANEL - Abnormal; Notable for the following components:      Result Value   Glucose, Bld 120 (*)    BUN 23 (*)    All other components within normal limits  TROPONIN I - Abnormal; Notable for the following components:   Troponin I 0.19 (*)    All other components within normal limits  CBC  APTT  PROTIME-INR  HEPARIN LEVEL (UNFRACTIONATED)   ____________________________________________  EKG  ED ECG  REPORT I, Arta Silence, the attending physician, personally viewed and interpreted this ECG.  Date: 01/07/2019 EKG Time: 0759 Rate: 85 Rhythm: normal sinus rhythm QRS Axis: normal Intervals: normal ST/T Wave abnormalities: normal Narrative Interpretation: no evidence of acute ischemia  ED ECG REPORT I, Arta Silence, the attending physician, personally viewed and interpreted this ECG.  Date: 01/07/2019 EKG Time: 0900 Rate: 79 Rhythm: normal sinus rhythm QRS Axis: normal Intervals: normal ST/T Wave abnormalities: normal Narrative Interpretation: no evidence of acute ischemia; no dynamic change when compared to EKG of 0759 today   ____________________________________________  RADIOLOGY  CXR: No focal infiltrate  ____________________________________________   PROCEDURES  Procedure(s) performed: No  Procedures  Critical Care performed: Yes  CRITICAL CARE Performed by: Arta Silence   Total critical care time: 20 minutes  Critical care time was exclusive of separately billable  procedures and treating other patients.  Critical care was necessary to treat or prevent imminent or life-threatening deterioration.  Critical care was time spent personally by me on the following activities: development of treatment plan with patient and/or surrogate as well as nursing, discussions with consultants, evaluation of patient's response to treatment, examination of patient, obtaining history from patient or surrogate, ordering and performing treatments and interventions, ordering and review of laboratory studies, ordering and review of radiographic studies, pulse oximetry and re-evaluation of patient's condition. ____________________________________________   INITIAL IMPRESSION / ASSESSMENT AND PLAN / ED COURSE  Pertinent labs & imaging results that were available during my care of the patient were reviewed by me and considered in my medical decision making (see  chart for details).  53 year old female with PMH as noted above presents with chest pain which awoke her from sleep earlier this morning and is substernal but also radiating to the left arm.  She reports some lightheadedness but no shortness of breath.  No prior history of this pain.  I reviewed the past medical records in Epic; the patient has no prior cardiac history or any echocardiogram or other cardiac diagnostics in our system.  She has no recent ED visits or admissions.  On exam, the patient is overall well-appearing.  Her vital signs are normal.  The remainder of the exam is unremarkable.  EKG is nonischemic.  Overall I suspect most likely musculoskeletal pain, GERD, radiculopathy, or other benign etiology.  Differential also includes ACS although this is less likely.  There is no clinical evidence to suggest aortic dissection or other vascular etiology.  Although I cannot rule the patient out by [facility] due to her age, she has no hypoxia, tachycardia, leg swelling, or any other signs or symptoms to suggest DVT or PE.  We will obtain basic labs, cardiac enzymes x2, chest x-ray, and reassess.  ----------------------------------------- 10:06 AM on 01/07/2019 -----------------------------------------  Initial troponin is elevated.  The patient states that her pain is actually improving although it is still present.  EKG shows no dynamic changes.  I gave the patient 3 additional aspirin 81 mg, and started her on a heparin infusion.  I paged the cardiologist on call Dr. Fletcher Anon who is currently in a catheterization.  I signed the patient out for admission to the hospitalist Dr. Posey Pronto. _______________  Wardell Heath was evaluated in Emergency Department on 01/07/2019 for the symptoms described in the history of present illness. She was evaluated in the context of the global COVID-19 pandemic, which necessitated consideration that the patient might be at risk for infection with the SARS-CoV-2 virus that  causes COVID-19. Institutional protocols and algorithms that pertain to the evaluation of patients at risk for COVID-19 are in a state of rapid change based on information released by regulatory bodies including the CDC and federal and state organizations. These policies and algorithms were followed during the patient's care in the ED.  ____________________________________________   FINAL CLINICAL IMPRESSION(S) / ED DIAGNOSES  Final diagnoses:  NSTEMI (non-ST elevated myocardial infarction) ([place])      NEW MEDICATIONS STARTED DURING THIS VISIT:  New Prescriptions   No medications on file     Note:  This document was prepared using Dragon voice recognition software and may include unintentional dictation errors.    Arta Silence, MD 01/07/19 1014

## 2019-01-07 NOTE — Progress Notes (Signed)
Patient ID: Kayla Farmer, female   DOB: 1966-03-03, 53 y.o.   MRN: [id number]  ACP note  Patient present.  Spoke with husband on the phone  Patient is a full code  Patient coming in with chest pain that woke her up from sleep radiating down the left arm associated with nausea and sweating.  She was found to have a borderline elevated troponin in the emergency room.  Story concerning for acute coronary syndrome.  Cardiology to evaluate to determine if they want to do a cardiac catheterization.  Serial troponins ordered.  Patient will be monitored on telemetry.  Aspirin, beta-blocker and heparin drip prescribed.  Lipid profile ordered for the morning.  Time spent on ACP discussion 17 minutes Dr Loletha Grayer

## 2019-01-07 NOTE — Consult Note (Signed)
Cardiology Consultation:   Patient ID: Kayla Farmer MRN: [id number]; DOB: Feb 06, 1966  Admit date: 01/07/2019 Date of Consult: 01/07/2019  Primary Care Provider: [name], MD Primary Cardiologist:  Reola Calkins Fletcher Anon) Primary Electrophysiologist:  None    Patient Profile:   Kayla Farmer is a 53 y.o. female with a hx of tobacco use who is being seen today for the evaluation of non-ST elevation myocardial infarction at the request of Dr. Leslye Peer.  History of Present Illness:   Ms. Kayla Farmer is a 53 year old female with no prior cardiac history.  She has known history of tobacco use, anxiety and family history of coronary artery disease.  She presented with sudden onset of chest pain which started around 3:00 this morning.  It is described as severe tightness feeling radiating to her left arm.  This has been continuous since that time but decreased significantly in intensity.  It was initially rated as 10 out of 10 and currently is 2 out of 10.  She felt very restless and nauseous with shortness of breath.  She denies any prior similar symptoms.  She was found to have mildly elevated troponin at 0.19.  EKG showed no ischemic changes.  Past Medical History:  Diagnosis Date  . Anxiety   . Arthritis   . Depression   . GERD (gastroesophageal reflux disease)   . History of cervical cancer   . Other inflammations of eyelids   . Positive colorectal cancer screening using Cologuard test 2019  . Sinus infection     Past Surgical History:  Procedure Laterality Date  . COLONOSCOPY WITH PROPOFOL N/A 01/24/2018   Procedure: COLONOSCOPY WITH PROPOFOL;  Surgeon: Robert Bellow, MD;  Location: [facility] ENDOSCOPY;  Service: Endoscopy;  Laterality: N/A;  . csection x2    . DILATION AND CURETTAGE OF UTERUS    . LIVER BIOPSY  04/26/2017  . TONSILLECTOMY       Home Medications:  Prior to Admission medications   Medication Sig Start Date End Date Taking? Authorizing Provider  busPIRone (BUSPAR) 30 MG  tablet Take 1 tablet (30 mg total) by mouth 3 (three) times daily. Patient taking differently: Take 30 mg by mouth daily.  11/13/18  Yes [name], MD  omeprazole (PRILOSEC) 40 MG capsule Take 1 capsule (40 mg total) by mouth daily. 11/13/18  Yes [name], MD  venlafaxine XR (EFFEXOR-XR) 75 MG 24 hr capsule TAKE 1 CAPSULE BY MOUTH EVERY DAY 12/12/18  Yes [name], MD    Inpatient Medications: Scheduled Meds: . [START ON 01/08/2019] aspirin EC  81 mg Oral Daily  . [START ON 01/08/2019] busPIRone  30 mg Oral Daily  . metoprolol tartrate  12.5 mg Oral BID  . pantoprazole  40 mg Oral Daily  . [START ON 01/08/2019] venlafaxine XR  75 mg Oral Q breakfast   Continuous Infusions: . heparin 850 Units/hr (01/07/19 0929)   PRN Meds: acetaminophen **OR** acetaminophen, nicotine, nitroGLYCERIN, ondansetron **OR** ondansetron (ZOFRAN) IV  Allergies:    Allergies  Allergen Reactions  . Codeine Itching    Social History:   Social History   Socioeconomic History  . Marital status: Married    Spouse name: Not on file  . Number of children: Not on file  . Years of education: Not on file  . Highest education level: Not on file  Occupational History  . Not on file  Social Needs  . Financial resource strain: Not on file  . Food insecurity:    Worry:  Not on file    Inability: Not on file  . Transportation needs:    Medical: Not on file    Non-medical: Not on file  Tobacco Use  . Smoking status: Current Every Day Smoker    Packs/day: 1.00    Years: 30.00    Pack years: 30.00    Types: Cigarettes  . Smokeless tobacco: Never Used  . Tobacco comment: smokes one pack of cigarettes per day since age 39, quit during pregnancies.  Substance and Sexual Activity  . Alcohol use: No    Alcohol/week: 0.0 standard drinks  . Drug use: No  . Sexual activity: Not on file  Lifestyle  . Physical activity:    Days per week: Not on file    Minutes per session: Not on file  . Stress: Not  on file  Relationships  . Social connections:    Talks on phone: Not on file    Gets together: Not on file    Attends religious service: Not on file    Active member of club or organization: Not on file    Attends meetings of clubs or organizations: Not on file    Relationship status: Not on file  . Intimate partner violence:    Fear of current or ex partner: Not on file    Emotionally abused: Not on file    Physically abused: Not on file    Forced sexual activity: Not on file  Other Topics Concern  . Not on file  Social History Narrative   Lives with spouse and children.  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   Quit high school in the 9th grade.  Was raised by grandmother due to parental conflicts.    Family History:    Family History  Problem Relation Age of Onset  . Coronary artery disease Mother   . Coronary artery disease Father        smoker  . Breast cancer Neg Hx   . Colon cancer Neg Hx      ROS:  Please see the history of present illness.   All other ROS reviewed and negative.     Physical Exam/Data:   Vitals:   01/07/19 0803 01/07/19 0834 01/07/19 1000  BP: (!) 146/98 132/78 133/80  Pulse: 83 79 70  Resp:  18 14  Temp: 98.1 F (36.7 C)    TempSrc: Oral    SpO2: 97% 97% 96%  Weight: 86.2 kg    Height: 5' 3"  (1.6 m)     No intake or output data in the 24 hours ending 01/07/19 1103 Last 3 Weights 01/07/2019 10/22/2018 01/24/2018  Weight (lbs) 190 lb 186 lb 181 lb  Weight (kg) 86.183 kg 84.369 kg 82.101 kg     Body mass index is 33.66 kg/m.  General:  Well nourished, well developed, in no acute distress HEENT: normal Lymph: no adenopathy Neck: no JVD Endocrine:  No thryomegaly Vascular: No carotid bruits; FA pulses 2+ bilaterally without bruits  Cardiac:  normal S1, S2; RRR; no murmur  Lungs:  clear to auscultation bilaterally, no wheezing, rhonchi or rales  Abd: soft, nontender, no hepatomegaly  Ext: no edema Musculoskeletal:  No  deformities, BUE and BLE strength normal and equal Skin: warm and dry  Neuro:  CNs 2-12 intact, no focal abnormalities noted Psych:  Normal affect   EKG:  The EKG was personally reviewed and demonstrates: Normal sinus rhythm with no significant ST or T wave changes. Telemetry:  Telemetry  was personally reviewed and demonstrates: Normal sinus rhythm  Relevant CV Studies:   Laboratory Data:  Chemistry Recent Labs  Lab 01/07/19 0804  NA 137  K 4.2  CL 105  CO2 24  GLUCOSE 120*  BUN 23*  CREATININE 0.91  CALCIUM 8.9  GFRNONAA >60  GFRAA >60  ANIONGAP 8    No results for input(s): PROT, ALBUMIN, AST, ALT, ALKPHOS, BILITOT in the last 168 hours. Hematology Recent Labs  Lab 01/07/19 0804  WBC 10.4  RBC 4.56  HGB 12.4  HCT 38.1  MCV 83.6  MCH 27.2  MCHC 32.5  RDW 14.6  PLT 242   Cardiac Enzymes Recent Labs  Lab 01/07/19 0804  TROPONINI 0.19*   No results for input(s): TROPIPOC in the last 168 hours.  BNPNo results for input(s): BNP, PROBNP in the last 168 hours.  DDimer No results for input(s): DDIMER in the last 168 hours.  Radiology/Studies:  Dg Chest 2 View  Result Date: 01/07/2019 CLINICAL DATA:  53 year old with chest pain. EXAM: CHEST - 2 VIEW COMPARISON:  07/21/2010 FINDINGS: Two views of the chest were obtained. Interstitial lung markings are mildly prominent in the mid and lower chest. No pleural effusions. Slightly increased densities in the anterior chest on the lateral view. Heart and mediastinum are within normal limits. Trachea is midline. Negative for a pneumothorax. IMPRESSION: Slightly enlarged interstitial markings in the mid and lower lung. Findings may represent mild interstitial pulmonary edema and/or scattered areas of mild atelectasis. Electronically Signed   By: Markus Daft M.D.   On: 01/07/2019 08:43    Assessment and Plan:   1. Non-ST elevation myocardial infarction: The patient has very classic history of myocardial infarction with mildly  elevated troponin.  EKG does not show any significant ischemic changes.  I agree with aspirin, heparin drip and high-dose statin.  I discussed different management options with the patient and have recommended proceeding with urgent cardiac catheterization and possible PCI.  I discussed the procedure in details as well as risks and benefits. 2. Tobacco use: I discussed with her the importance of smoking cessation. 3. Hyperlipidemia: Recent lipid profile showed an LDL of 145.  Given presentation with myocardial infarction, a high potency statin is recommended.      For questions or updates, please contact [place] Please consult [email or web address] for contact info under     Signed, [name], MD  01/07/2019 11:03 AM

## 2019-01-07 NOTE — Progress Notes (Signed)
Pt returns from cardiac cath with stent placement. On initial assessment right radial dressing was clean dry and intact. Upon re assessment a small amount of bright red blood is visible. Area outlined. Pt has no complaints of pain, SR on telemetry. I will continue to assess.

## 2019-01-07 NOTE — ED Notes (Addendum)
Pt back from xray, reports left arm pain and chest pain, A&Ox 4, Dr. Charlynn Court at bedside

## 2019-01-07 NOTE — ED Notes (Signed)
ED TO INPATIENT HANDOFF REPORT  ED Nurse Name and Phone #: Janett Billow [phone]   S Name/Age/Gender Wardell Heath 53 y.o. female Room/Bed: ED09A/ED09A  Code Status   Code Status: Not on file  Home/SNF/Other Home Patient oriented to: self, place, time and situation Is this baseline? Yes   Triage Complete: Triage complete  Chief Complaint cp  Triage Note Pt c/oo waking with chest pain that radiates into the left arm and neck with SOB , diaphoresis at onset with nausea. Pt is in NAD at present. Ambulatory to triage.   Allergies Allergies  Allergen Reactions  . Codeine Itching    Level of Care/Admitting Diagnosis ED Disposition    ED Disposition Condition [facility]: [place] [[id number]]  Level of Care: Telemetry [5]  Covid Evaluation: N/A  Diagnosis: ACS (acute coronary syndrome) [facility]) [[id number]]  Admitting Physician: Loletha Grayer [[id number]]  Attending Physician: Loletha Grayer [phone]  Estimated length of stay: past midnight tomorrow  Certification:: I certify this patient will need inpatient services for at least 2 midnights  PT Class (Do Not Modify): Inpatient [101]  PT Acc Code (Do Not Modify): Private [1]       B Medical/Surgery History Past Medical History:  Diagnosis Date  . Anxiety   . Arthritis   . Depression   . GERD (gastroesophageal reflux disease)   . History of cervical cancer   . Other inflammations of eyelids   . Positive colorectal cancer screening using Cologuard test 2019  . Sinus infection    Past Surgical History:  Procedure Laterality Date  . COLONOSCOPY WITH PROPOFOL N/A 01/24/2018   Procedure: COLONOSCOPY WITH PROPOFOL;  Surgeon: Robert Bellow, MD;  Location: [facility] ENDOSCOPY;  Service: Endoscopy;  Laterality: N/A;  . csection x2    . LIVER BIOPSY  04/26/2017  . TONSILLECTOMY       A IV Location/Drains/Wounds Patient Lines/Drains/Airways Status   Active Line/Drains/Airways    Name:    Placement date:   Placement time:   Site:   Days:   Peripheral IV 01/07/19 Left Arm   01/07/19    0847    Arm   less than 1          Intake/Output Last 24 hours No intake or output data in the 24 hours ending 01/07/19 1038  Labs/Imaging Results for orders placed or performed during the hospital encounter of 01/07/19 (from the past 48 hour(s))  Basic metabolic panel     Status: Abnormal   Collection Time: 01/07/19  8:04 AM  Result Value Ref Range   Sodium 137 135 - 145 mmol/L   Potassium 4.2 3.5 - 5.1 mmol/L   Chloride 105 98 - 111 mmol/L   CO2 24 22 - 32 mmol/L   Glucose, Bld 120 (H) 70 - 99 mg/dL   BUN 23 (H) 6 - 20 mg/dL   Creatinine, Ser 0.91 0.44 - 1.00 mg/dL   Calcium 8.9 8.9 - 10.3 mg/dL   GFR calc non Af Amer >60 >60 mL/min   GFR calc Af Amer >60 >60 mL/min   Anion gap 8 5 - 15    Comment: Performed at [facility], [address] [place], [place] [address]  CBC     Status: None   Collection Time: 01/07/19  8:04 AM  Result Value Ref Range   WBC 10.4 4.0 - 10.5 K/uL   RBC 4.56 3.87 - 5.11 MIL/uL   Hemoglobin 12.4 12.0 - 15.0 g/dL  HCT 38.1 36.0 - 46.0 %   MCV 83.6 80.0 - 100.0 fL   MCH 27.2 26.0 - 34.0 pg   MCHC 32.5 30.0 - 36.0 g/dL   RDW 14.6 11.5 - 15.5 %   Platelets 242 150 - 400 K/uL   nRBC 0.0 0.0 - 0.2 %    Comment: Performed at [facility], [address] [place], [place] [address]  Troponin I - ONCE - STAT     Status: Abnormal   Collection Time: 01/07/19  8:04 AM  Result Value Ref Range   Troponin I 0.19 (HH) <0.03 ng/mL    Comment: CRITICAL RESULT CALLED TO, READ BACK BY AND VERIFIED WITH Amado Andal AT 2951 01/07/2019 DAS Performed at [facility], [address] [place], [place] [address]   APTT     Status: None   Collection Time: 01/07/19  9:21 AM  Result Value Ref Range   aPTT 27 24 - 36 seconds    Comment: Performed at [facility], [address] [place], [place] [address]  Protime-INR      Status: None   Collection Time: 01/07/19  9:21 AM  Result Value Ref Range   Prothrombin Time 12.6 11.4 - 15.2 seconds   INR 1.0 0.8 - 1.2    Comment: (NOTE) INR goal varies based on device and disease states. Performed at [facility], [address], [place],  [address]    Dg Chest 2 View  Result Date: 01/07/2019 CLINICAL DATA:  53 year old with chest pain. EXAM: CHEST - 2 VIEW COMPARISON:  07/21/2010 FINDINGS: Two views of the chest were obtained. Interstitial lung markings are mildly prominent in the mid and lower chest. No pleural effusions. Slightly increased densities in the anterior chest on the lateral view. Heart and mediastinum are within normal limits. Trachea is midline. Negative for a pneumothorax. IMPRESSION: Slightly enlarged interstitial markings in the mid and lower lung. Findings may represent mild interstitial pulmonary edema and/or scattered areas of mild atelectasis. Electronically Signed   By: Markus Daft M.D.   On: 01/07/2019 08:43    Pending Labs Unresulted Labs (From admission, onward)    Start     Ordered   01/07/19 1600  Heparin level (unfractionated)  Once-Timed,   STAT     01/07/19 0912   Unscheduled  HIV antibody (Routine Testing)  Add-on,   R     01/07/19 1038   Unscheduled  Basic metabolic panel  Tomorrow morning,   R     01/07/19 1038   Unscheduled  CBC  Tomorrow morning,   R     01/07/19 1038          Vitals/Pain Today's Vitals   01/07/19 0803 01/07/19 0806 01/07/19 0834 01/07/19 1000  BP: (!) 146/98  132/78 133/80  Pulse: 83  79 70  Resp:   18 14  Temp: 98.1 F (36.7 C)     TempSrc: Oral     SpO2: 97%  97% 96%  Weight: 86.2 kg     Height: 5' 3"  (1.6 m)     PainSc:  9       Isolation Precautions No active isolations  Medications Medications  heparin ADULT infusion 100 units/mL (25000 units/253m sodium chloride 0.45%) (850 Units/hr Intravenous New Bag/Given 01/07/19 0929)  acetaminophen (TYLENOL) tablet 650 mg (has  no administration in time range)    Or  acetaminophen (TYLENOL) suppository 650 mg (has no administration in time range)  ondansetron (ZOFRAN) tablet 4 mg (  has no administration in time range)    Or  ondansetron (ZOFRAN) injection 4 mg (has no administration in time range)  sodium chloride flush (NS) 0.9 % injection 3 mL (3 mLs Intravenous Given 01/07/19 0854)  aspirin chewable tablet 162 mg (162 mg Oral Given 01/07/19 0853)  aspirin chewable tablet 81 mg (81 mg Oral Given 01/07/19 0854)  heparin bolus via infusion 4,000 Units (4,000 Units Intravenous Bolus from Bag 01/07/19 0930)    Mobility walks Low fall risk   Focused Assessments Cardiac Assessment Handoff:  Cardiac Rhythm: Normal sinus rhythm Lab Results  Component Value Date   TROPONINI 0.19 ([place]) 01/07/2019   No results found for: DDIMER Does the Patient currently have chest pain? 1    R Recommendations: See Admitting Provider Note  Report given to:   Additional Notes:

## 2019-01-07 NOTE — H&P (Addendum)
[place] [facility] [place] NAME: Kayla Farmer    MR#:  [id number]  DATE OF BIRTH:  [birth year]  DATE OF ADMISSION:  01/07/2019  PRIMARY CARE PHYSICIAN: [name], MD   REQUESTING/REFERRING PHYSICIAN: Dr Fredirick Maudlin  CHIEF COMPLAINT:   Chief Complaint  Patient presents with  . Chest Pain    HISTORY OF PRESENT ILLNESS:  Kayla Farmer  is a 53 y.o. female presents the hospital after she developed chest pain after turning over in bed.  The chest pain described as a pressure in the center of her chest radiating down the left arm.  Her left arm is throbbing.  Was 10 out of 10 intensity at home.  She was very restless.  She had some associated nausea and sweating.  No shortness of breath.  Right now the chest pain is very dull about 2 out of 10 intensity.  She did take an aspirin at home and was given aspirin in the emergency room.  In the ER her troponin was found to be borderline at 0.19.  She was placed on heparin drip and hospitalist services were contacted for further evaluation.  PAST MEDICAL HISTORY:   Past Medical History:  Diagnosis Date  . Anxiety   . Arthritis   . Depression   . GERD (gastroesophageal reflux disease)   . History of cervical cancer   . Other inflammations of eyelids   . Positive colorectal cancer screening using Cologuard test 2019  . Sinus infection     PAST SURGICAL HISTORY:   Past Surgical History:  Procedure Laterality Date  . COLONOSCOPY WITH PROPOFOL N/A 01/24/2018   Procedure: COLONOSCOPY WITH PROPOFOL;  Surgeon: Robert Bellow, MD;  Location: [facility] ENDOSCOPY;  Service: Endoscopy;  Laterality: N/A;  . csection x2    . DILATION AND CURETTAGE OF UTERUS    . LIVER BIOPSY  04/26/2017  . TONSILLECTOMY      SOCIAL HISTORY:   Social History   Tobacco Use  . Smoking status: Current Every Day Smoker    Packs/day: 1.00    Years: 30.00    Pack years: 30.00    Types: Cigarettes  . Smokeless  tobacco: Never Used  . Tobacco comment: smokes one pack of cigarettes per day since age 13, quit during pregnancies.  Substance Use Topics  . Alcohol use: No    Alcohol/week: 0.0 standard drinks    FAMILY HISTORY:   Family History  Problem Relation Age of Onset  . Coronary artery disease Mother   . Coronary artery disease Father        smoker  . Breast cancer Neg Hx   . Colon cancer Neg Hx     DRUG ALLERGIES:   Allergies  Allergen Reactions  . Codeine Itching    REVIEW OF SYSTEMS:  CONSTITUTIONAL: No fever, or chills.  Positive for sweating with episode.  Positive for fatigue.  EYES: No blurred or double vision.  EARS, NOSE, AND THROAT: No tinnitus or ear pain. No sore throat.  Positive dry mouth. RESPIRATORY: No cough, shortness of breath, wheezing or hemoptysis.  CARDIOVASCULAR: Positive for chest pressure.  No orthopnea, edema.  GASTROINTESTINAL: Some nausea.no vomiting, diarrhea or abdominal pain. No blood in bowel movements GENITOURINARY: No dysuria, hematuria.  ENDOCRINE: No polyuria, nocturia,  HEMATOLOGY: No anemia, easy bruising or bleeding SKIN: No rash or lesion. MUSCULOSKELETAL: No joint pain or arthritis.   NEUROLOGIC: No tingling, numbness, weakness.  PSYCHIATRY: History of anxiety and depression.  MEDICATIONS AT HOME:   Prior to Admission medications   Medication Sig Start Date End Date Taking? Authorizing Provider  busPIRone (BUSPAR) 30 MG tablet Take 1 tablet (30 mg total) by mouth 3 (three) times daily. Patient taking differently: Take 30 mg by mouth daily.  11/13/18  Yes [name], MD  omeprazole (PRILOSEC) 40 MG capsule Take 1 capsule (40 mg total) by mouth daily. 11/13/18  Yes [name], MD  venlafaxine XR (EFFEXOR-XR) 75 MG 24 hr capsule TAKE 1 CAPSULE BY MOUTH EVERY DAY 12/12/18  Yes [name], MD      VITAL SIGNS:  Blood pressure 133/80, pulse 70, temperature 98.1 F (36.7 C), temperature source Oral, resp. rate 14, height 5'  3" (1.6 m), weight 86.2 kg, last menstrual period 10/13/2014, SpO2 96 %.  PHYSICAL EXAMINATION:  GENERAL:  53 y.o.-year-old patient lying in the bed with no acute distress.  EYES: Pupils equal, round, reactive to light and accommodation. No scleral icterus. Extraocular muscles intact.  HEENT: Head atraumatic, normocephalic. Oropharynx and nasopharynx clear.  NECK:  Supple, no jugular venous distention. No thyroid enlargement, no tenderness.  LUNGS: Normal breath sounds bilaterally, no wheezing, rales,rhonchi or crepitation. No use of accessory muscles of respiration.  CARDIOVASCULAR: S1, S2 normal. No murmurs, rubs, or gallops.  ABDOMEN: Soft, nontender, nondistended. Bowel sounds present. No organomegaly or mass.  EXTREMITIES: No pedal edema, cyanosis, or clubbing.  NEUROLOGIC: Cranial nerves II through XII are intact. Muscle strength 5/5 in all extremities. Sensation intact. Gait not checked.  PSYCHIATRIC: The patient is alert and oriented x 3.  SKIN: No rash, lesion, or ulcer.   LABORATORY PANEL:   CBC Recent Labs  Lab 01/07/19 0804  WBC 10.4  HGB 12.4  HCT 38.1  PLT 242   ------------------------------------------------------------------------------------------------------------------  Chemistries  Recent Labs  Lab 01/07/19 0804  NA 137  K 4.2  CL 105  CO2 24  GLUCOSE 120*  BUN 23*  CREATININE 0.91  CALCIUM 8.9   ------------------------------------------------------------------------------------------------------------------  Cardiac Enzymes Recent Labs  Lab 01/07/19 0804  TROPONINI 0.19*   ------------------------------------------------------------------------------------------------------------------  RADIOLOGY:  Dg Chest 2 View  Result Date: 01/07/2019 CLINICAL DATA:  53 year old with chest pain. EXAM: CHEST - 2 VIEW COMPARISON:  07/21/2010 FINDINGS: Two views of the chest were obtained. Interstitial lung markings are mildly prominent in the mid and  lower chest. No pleural effusions. Slightly increased densities in the anterior chest on the lateral view. Heart and mediastinum are within normal limits. Trachea is midline. Negative for a pneumothorax. IMPRESSION: Slightly enlarged interstitial markings in the mid and lower lung. Findings may represent mild interstitial pulmonary edema and/or scattered areas of mild atelectasis. Electronically Signed   By: Markus Daft M.D.   On: 01/07/2019 08:43    EKG:   Normal sinus rhythm 85 bpm.  No acute ST-T wave changes  IMPRESSION AND PLAN:   1.  Acute coronary syndrome with chest pressure radiating down the left arm and borderline elevated troponin.  Case discussed with cardiology Dr. Fletcher Anon.  Will keep n.p.o. for now.  Serial cardiac troponins.  Heparin drip.  Aspirin and low-dose metoprolol.  2.  Anxiety and depression on BuSpar and Effexor 3.  GERD on PPI 4.  Impaired fasting glucose.  Patient had a recent hemoglobin A1c at 5.9.  Patient not a diabetic at this time. 5.  Tobacco abuse.  Smoking cessation counseling done 4 minutes by me.  As needed nicotine patch if she chooses.   All the records  are reviewed and case discussed with ED provider and cardiology. Management plans discussed with the patient, family and they are in agreement.  CODE STATUS: Full code  TOTAL TIME TAKING CARE OF THIS PATIENT: 35 minutes.    Loletha Grayer M.D on 01/07/2019 at 10:45 AM  Between 7am to 6pm - Pager - [phone]  After 6pm call admission pager [phone]  [employer] Office  [phone]  CC: Primary care physician; [name], MD

## 2019-01-07 NOTE — Progress Notes (Signed)
Pt. C/o nausea: med. With Zofran 4 mg IV. Placed on O2 at 2L/[place]. After 7 min., pt. States "I'm starting to feel a little better."

## 2019-01-07 NOTE — ED Notes (Signed)
Dr. Charlynn Court notified of elevated troponin, see new orders

## 2019-01-07 NOTE — ED Triage Notes (Signed)
Pt c/oo waking with chest pain that radiates into the left arm and neck with SOB , diaphoresis at onset with nausea. Pt is in NAD at present. Ambulatory to triage.

## 2019-01-08 ENCOUNTER — Telehealth: Payer: Self-pay | Admitting: Internal Medicine

## 2019-01-08 DIAGNOSIS — E785 Hyperlipidemia, unspecified: Secondary | ICD-10-CM

## 2019-01-08 LAB — CBC
HCT: 37.6 % (ref 36.0–46.0)
Hemoglobin: 11.9 g/dL — ABNORMAL LOW (ref 12.0–15.0)
MCH: 27 pg (ref 26.0–34.0)
MCHC: 31.6 g/dL (ref 30.0–36.0)
MCV: 85.3 fL (ref 80.0–100.0)
Platelets: 229 10*3/uL (ref 150–400)
RBC: 4.41 MIL/uL (ref 3.87–5.11)
RDW: 14.7 % (ref 11.5–15.5)
WBC: 10.8 10*3/uL — ABNORMAL HIGH (ref 4.0–10.5)
nRBC: 0 % (ref 0.0–0.2)

## 2019-01-08 LAB — LIPID PANEL
Cholesterol: 203 mg/dL — ABNORMAL HIGH (ref 0–200)
HDL: 48 mg/dL (ref >40)
LDL Cholesterol: 132 mg/dL — ABNORMAL HIGH (ref 0–99)
Total CHOL/HDL Ratio: 4.2 RATIO
Triglycerides: 113 mg/dL (ref <150)
VLDL: 23 mg/dL (ref 0–40)

## 2019-01-08 LAB — BASIC METABOLIC PANEL
Anion gap: 8 (ref 5–15)
BUN: 18 mg/dL (ref 6–20)
CO2: 24 mmol/L (ref 22–32)
Calcium: 8.8 mg/dL — ABNORMAL LOW (ref 8.9–10.3)
Chloride: 106 mmol/L (ref 98–111)
Creatinine, Ser: 0.9 mg/dL (ref 0.44–1.00)
GFR calc Af Amer: >60 mL/min (ref >60)
GFR calc non Af Amer: >60 mL/min (ref >60)
Glucose, Bld: 105 mg/dL — ABNORMAL HIGH (ref 70–99)
Potassium: 4.2 mmol/L (ref 3.5–5.1)
Sodium: 138 mmol/L (ref 135–145)

## 2019-01-08 LAB — TROPONIN I: Troponin I: 3.22 ng/mL (ref <0.03)

## 2019-01-08 MED ORDER — ASPIRIN 81 MG PO TBEC: 81.0000 mg | DELAYED_RELEASE_TABLET | Freq: Every day | ORAL | 0 refills | Status: AC

## 2019-01-08 MED ORDER — BUSPIRONE HCL 30 MG PO TABS: 30.0000 mg | ORAL_TABLET | Freq: Every day | ORAL | Status: DC

## 2019-01-08 MED ORDER — ATORVASTATIN CALCIUM 80 MG PO TABS: 80.0000 mg | ORAL_TABLET | Freq: Every day | ORAL | 0 refills | Status: DC

## 2019-01-08 MED ORDER — TICAGRELOR 90 MG PO TABS: 90.0000 mg | ORAL_TABLET | Freq: Two times a day (BID) | ORAL | 0 refills | Status: DC

## 2019-01-08 MED ORDER — NITROGLYCERIN 0.4 MG SL SUBL: 0.4000 mg | SUBLINGUAL_TABLET | SUBLINGUAL | 0 refills | Status: DC | PRN

## 2019-01-08 MED ORDER — METOPROLOL SUCCINATE ER 25 MG PO TB24: 25.0000 mg | ORAL_TABLET | Freq: Every day | ORAL | 0 refills | Status: DC

## 2019-01-08 MED ORDER — METOPROLOL SUCCINATE ER 25 MG PO TB24
25.0000 mg | ORAL_TABLET | Freq: Every day | ORAL | Status: DC
  Administered 2019-01-08: 25 mg via ORAL
  Filled 2019-01-08: qty 1

## 2019-01-08 NOTE — TOC Transition Note (Addendum)
Transition of Care [facility]) - CM/SW Discharge Note   Patient Details  Name: Kayla Farmer MRN: [id number] Date of Birth: 04/25/1966  [facility] CM/SW Contact:  Ross Ludwig, LCSW Phone Number: 01/08/2019, 11:52 AM   Clinical Narrative:     Patient is a 53 year old female who is married, she is alert and oriented x4.  Patient lives with her spouse, and plans to return back home.  Patient will be needing Brilinta which is a new medication for her.  CSW provided a free 30 day supply medication coupon, patients medication benefits were verified that her medication will be $47 a month and the pharmacy will have to get prior authorization after her initial 30 day is used up.  CSW updated patient about her medication cost.  Patient did not have any other questions or concerns.  She will have a follow up appointment with her Cardiologist to discuss medications.    Final next level of care: Home/Self Care Barriers to Discharge: No Barriers Identified   Patient Goals and CMS Choice Patient states their goals for this hospitalization and ongoing recovery are:: To return back home   Choice offered to / list presented to : NA  Discharge Placement    Patient will be discharging back home with spouse.               Discharge Plan and Services In-house Referral: NA   Post Acute Care Choice: NA          DME Arranged: N/A DME Agency: NA       HH Arranged: NA HH Agency: NA        Social Determinants of Health (SDOH) Interventions     Readmission Risk Interventions No flowsheet data found.

## 2019-01-08 NOTE — Discharge Summary (Signed)
Kayla Farmer    MR#:  [id number]  DATE OF BIRTH:  Dec 29, 1965  DATE OF ADMISSION:  01/07/2019 ADMITTING PHYSICIAN: Loletha Grayer, MD  DATE OF DISCHARGE: 01/08/2019 11:48 AM  PRIMARY CARE PHYSICIAN: [name], MD    ADMISSION DIAGNOSIS:  NSTEMI (non-ST elevated myocardial infarction) ([place]) [I21.4]  DISCHARGE DIAGNOSIS:  Active Problems:   Hyperlipidemia LDL goal <70   ACS (acute coronary syndrome) ([facility])   NSTEMI (non-ST elevated myocardial infarction) ([place])   SECONDARY DIAGNOSIS:   Past Medical History:  Diagnosis Date  . Anxiety   . Arthritis   . Depression   . GERD (gastroesophageal reflux disease)   . History of cervical cancer   . Other inflammations of eyelids   . Positive colorectal cancer screening using Cologuard test 2019  . Sinus infection     HOSPITAL COURSE:   1.  Acute myocardial infarction.  The patient was admitted to the hospital with chest pressure and a borderline troponin.  Patient was started on aspirin low-dose metoprolol and heparin drip.  The patient was seen by cardiology and brought to the cardiac Cath Lab.  The patient had two-vessel coronary artery disease.  A stent was placed in the mid LAD for 85% stenosis.  The RPDA was 100% stenosed and had left to right collaterals.  Patient was feeling better upon disposition.  She was prescribed aspirin, Brilinta, Toprol-XL and Lipitor.  Nitroglycerin was given as needed.  Follow-up with cardiac rehab and cardiology as outpatient.  The 30-day discount card will cover the Brilinta for now but will need prior authorization for refills. 2.  Anxiety depression on BuSpar and Effexor 3.  GERD on PPI 4.  Impaired fasting glucose 5.  Tobacco abuse patient must stop smoking.  DISCHARGE CONDITIONS:   Satisfactory  CONSULTS OBTAINED:  Treatment Team:  Wellington Hampshire, MD  DRUG ALLERGIES:   Allergies  Allergen Reactions  . Codeine  Itching    DISCHARGE MEDICATIONS:   Allergies as of 01/08/2019      Reactions   Codeine Itching      Medication List    TAKE these medications   aspirin 81 MG EC tablet Take 1 tablet (81 mg total) by mouth daily.   atorvastatin 80 MG tablet Commonly known as:  LIPITOR Take 1 tablet (80 mg total) by mouth daily at 6 PM.   busPIRone 30 MG tablet Commonly known as:  BUSPAR Take 1 tablet (30 mg total) by mouth daily.   metoprolol succinate 25 MG 24 hr tablet Commonly known as:  TOPROL-XL Take 1 tablet (25 mg total) by mouth daily.   nitroGLYCERIN 0.4 MG SL tablet Commonly known as:  NITROSTAT Place 1 tablet (0.4 mg total) under the tongue every 5 (five) minutes as needed for chest pain.   omeprazole 40 MG capsule Commonly known as:  PRILOSEC Take 1 capsule (40 mg total) by mouth daily.   ticagrelor 90 MG Tabs tablet Commonly known as:  BRILINTA Take 1 tablet (90 mg total) by mouth 2 (two) times daily.   venlafaxine XR 75 MG 24 hr capsule Commonly known as:  EFFEXOR-XR TAKE 1 CAPSULE BY MOUTH EVERY DAY        DISCHARGE INSTRUCTIONS:   Follow-up PMD 5 days Follow-up cardiology 1 week Follow-up cardiac rehab  If you experience worsening of your admission symptoms, develop shortness of breath, life threatening emergency, suicidal or homicidal thoughts you must seek medical attention  immediately by calling 911 or calling your MD immediately  if symptoms less severe.  You Must read complete instructions/literature along with all the possible adverse reactions/side effects for all the Medicines you take and that have been prescribed to you. Take any new Medicines after you have completely understood and accept all the possible adverse reactions/side effects.   Please note  You were cared for by a hospitalist during your hospital stay. If you have any questions about your discharge medications or the care you received while you were in the hospital after you are  discharged, you can call the unit and asked to speak with the hospitalist on call if the hospitalist that took care of you is not available. Once you are discharged, your primary care physician will handle any further medical issues. Please note that NO REFILLS for any discharge medications will be authorized once you are discharged, as it is imperative that you return to your primary care physician (or establish a relationship with a primary care physician if you do not have one) for your aftercare needs so that they can reassess your need for medications and monitor your lab values.    Today   CHIEF COMPLAINT:   Chief Complaint  Patient presents with  . Chest Pain    HISTORY OF PRESENT ILLNESS:  Kayla Farmer  is a 53 y.o. female came in with chest pain and found to have a borderline troponin.  Diagnosed with myocardial infarction.   VITAL SIGNS:  Blood pressure 117/78, pulse 72, temperature 98.6 F (37 C), temperature source Oral, resp. rate 18, height 5' 3"  (1.6 m), weight 84.8 kg, last menstrual period 10/13/2014, SpO2 97 %.    PHYSICAL EXAMINATION:  GENERAL:  53 y.o.-year-old patient lying in the bed with no acute distress.  EYES: Pupils equal, round, reactive to light and accommodation. No scleral icterus. Extraocular muscles intact.  HEENT: Head atraumatic, normocephalic. Oropharynx and nasopharynx clear.  NECK:  Supple, no jugular venous distention. No thyroid enlargement, no tenderness.  LUNGS: Normal breath sounds bilaterally, no wheezing, rales,rhonchi or crepitation. No use of accessory muscles of respiration.  CARDIOVASCULAR: S1, S2 normal. No murmurs, rubs, or gallops.  ABDOMEN: Soft, non-tender, non-distended. Bowel sounds present. No organomegaly or mass.  EXTREMITIES: No pedal edema, cyanosis, or clubbing.  NEUROLOGIC: Cranial nerves II through XII are intact. Muscle strength 5/5 in all extremities. Sensation intact. Gait not checked.  PSYCHIATRIC: The patient is alert  and oriented x 3.  SKIN: No obvious rash, lesion, or ulcer.   DATA REVIEW:   CBC Recent Labs  Lab 01/08/19 0455  WBC 10.8*  HGB 11.9*  HCT 37.6  PLT 229    Chemistries  Recent Labs  Lab 01/08/19 0455  NA 138  K 4.2  CL 106  CO2 24  GLUCOSE 105*  BUN 18  CREATININE 0.90  CALCIUM 8.8*    Cardiac Enzymes Recent Labs  Lab 01/08/19 0455  TROPONINI 3.22*     RADIOLOGY:  Dg Chest 2 View  Result Date: 01/07/2019 CLINICAL DATA:  53 year old with chest pain. EXAM: CHEST - 2 VIEW COMPARISON:  07/21/2010 FINDINGS: Two views of the chest were obtained. Interstitial lung markings are mildly prominent in the mid and lower chest. No pleural effusions. Slightly increased densities in the anterior chest on the lateral view. Heart and mediastinum are within normal limits. Trachea is midline. Negative for a pneumothorax. IMPRESSION: Slightly enlarged interstitial markings in the mid and lower lung. Findings may represent mild interstitial pulmonary  edema and/or scattered areas of mild atelectasis. Electronically Signed   By: Markus Daft M.D.   On: 01/07/2019 08:43      Management plans discussed with the patient, and she is in agreement.  Tried to reach the husband on the phone.  Unable to even leave a message.  CODE STATUS:     Code Status Orders  (From admission, onward)         Start     Ordered   01/07/19 1038  Full code  Continuous     01/07/19 1038        Code Status History    This patient has a current code status but no historical code status.      TOTAL TIME TAKING CARE OF THIS PATIENT: 35 minutes.    Loletha Grayer M.D on 01/08/2019 at 2:21 PM  Between 7am to 6pm - Pager - [phone]  After 6pm go to [email or web address] - password [email or web address]  [employer] Office  [phone]  CC: Primary care physician; [name], MD

## 2019-01-08 NOTE — Progress Notes (Addendum)
Cardiovascular and Pulmonary Nurse Navigator Note:    53 year old female who presented to the ED on 01/07/2019 with c/o chest pain - pressure in the center of her chest radiating down the left arm with associated nausea and sweating.  Troponin elevated in ER at 0.19.  Patient ruled in for NSTEMI.  Patient underwent Cardiac Cath yesterday.   See below.    Wellington Hampshire, MD (Primary)  Marrianne Mood D, PA-C  Procedures   CORONARY STENT INTERVENTION  LEFT HEART CATH AND CORONARY ANGIOGRAPHY  Conclusion     The left ventricular systolic function is normal.  LV end diastolic pressure is normal.  The left ventricular ejection fraction is 50-55% by visual estimate.  Prox LAD lesion is 30% stenosed.  Mid LAD lesion is 85% stenosed.  Post intervention, there is a 0% residual stenosis.  A drug-eluting stent was successfully placed using a STENT RESOLUTE ONYX 2.25X22.  Mid RCA lesion is 40% stenosed.  RPDA lesion is 100% stenosed.   1.  Significant two-vessel coronary artery disease involving mid LAD and occluded mid right PDA with left-to-right collaterals.  The occluded PDA is the likely culprit for myocardial infarction.  However, the vessel is small in size and already with  some left-to-right collaterals. 2.  Normal LV systolic function and high normal left ventricular end-diastolic pressure. 3.  Successful angioplasty and drug-eluting stent placement to the mid LAD.  Recommendations: Dual antiplatelet therapy for at least 1 year. Aggressive treatment of risk factors. I added high-dose atorvastatin. Smoking cessation was strongly advised. Possible discharge home tomorrow if no new issues.    EDUCATION:   Rounded on patient to provide education.  Patient walking around in the room.   "Heart Attack Bouncing Back" booklet given and reviewed with patient. Discussed the definition of CAD. Reviewed the location of CAD and where her stent was placed. Informed patient she will  be given a stent card.Patient has card.   Explained the purpose of the stent card. Instructed patient to keep stent card in her wallet.  ? Discussed modifiable risk factors including controlling blood pressure, cholesterol, and blood sugar; following heart healthy diet; maintaining healthy weight; exercise; and smoking cessation.  ? Discussed cardiac medications including rationale for taking, mechanisms of action, and side effects. Stressed the importance of taking medications as prescribed. Patient stated, "I do not like to take medications."  This RN stressed the importance of taking medications as prescribed to avoid further progression of CAD and prevent re-in stent stenosis.  Patient is being discharged  ? Discussed emergency plan for heart attack symptoms. Patient verbalized understanding of need to call 911 and not to drive himself to ER if having cardiac symptoms / chest pain. Patient shared she did not want to spend money for an ambulance.  Explained to patient how care could be provided right away as opposed to driving to ER.   ? Heart healthy diet of low sodium, low fat, low cholesterol heart healthy diet discussed. Patient informed me she is pre-diabetic.  Dr. Derrel Nip is patient's PCP. Hgb A1C in March 2020 was 5.9.  Patient refused starting on medication (Metformin) at that time, as patient wanted to try and lower her numbers on her own.   Information on Heart-Healthy Consistent Carbohydrate Nutrition Therapy provided and reviewed with patient.   ? Smoking Cessation - Patient is a current every day smoker. Patient reports smoking 1 ppd since the age of 40.  Patient stated at the moment she is afraid  to smoke for fear it would go right to her heart.  When asked about the Nicotine (Nicoderm Patch), patient stated she did not want the patch and has not had the patch applied while in the hospital.  Again, patient stated she does not like medication.  "Thinking about Quitting - Yes, You Can'  informational sheet given and reviewed with patient.  The effects of smoking on the body and cardiovascular system as well as the benefits of quitting discussed.  Handout on the 2020 Quit Smart Class Schedule including the new Telephonic Class Option given to patient.   ? 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  Benefits of exercised discussed.  Informed patient that her cardiologist has referred her to outpatient Cardiac Rehab. An overview of the program was provided. Brochure, informational letter, class and orientation times, and CPT billing codes given to patient. Patient plans to check with her insurance company to see what her out-of-pocket expenses will be. Patient also stated she might be interested in Virtual Cardiac Rehab if she could learn how to use her computer.    At times patient was teary eyed and talked about how she did not imagine herself having a heart attack.   Discussed possible emotions she might experience going forward.  Instructed patient if she was feeling down and couldn't shake it, then she needed to discuss this with her PCP and talk with someone.  Patient nodded her head in understanding.   ?  Patient appreciative of the information.  ? Roanna Epley, RN, BSN, [place]  [facility] Cardiac & Pulmonary Rehab  Cardiovascular & Pulmonary Nurse Navigator  Direct Line: [phone]  Department Phone #: [phone] Fax: [phone]  Email Address: [email or web address]

## 2019-01-08 NOTE — Telephone Encounter (Signed)
HFU/ Pt is being discharged today from [facility]. Dx is acute coronary syndrome. Pt has been scheduled on 05/06 @ 11am. Thank you!

## 2019-01-08 NOTE — Progress Notes (Signed)
Progress Note  Patient Name: Kayla Farmer Date of Encounter: 01/08/2019  Primary Cardiologist: [facility], Dr. Fletcher Anon  Subjective   Patient denied any chest pain, palpitations, or feeling of racing HR.  She did note occasional pressure, mainly associated with her R flank. She stated, however, that she is overall improved since admission. She denied any further nausea or shortness of breath.    She has not yet ambulated except to the restroom.  Inpatient Medications    Scheduled Meds: . aspirin EC  81 mg Oral Daily  . atorvastatin  80 mg Oral q1800  . busPIRone  30 mg Oral Daily  . metoprolol succinate  25 mg Oral Daily  . pantoprazole  40 mg Oral Daily  . sodium chloride flush  3 mL Intravenous Q12H  . ticagrelor  90 mg Oral BID  . venlafaxine XR  75 mg Oral Q breakfast   Continuous Infusions: . sodium chloride     PRN Meds: sodium chloride, acetaminophen **OR** acetaminophen, nicotine, nitroGLYCERIN, ondansetron **OR** ondansetron (ZOFRAN) IV, sodium chloride flush   Vital Signs    Vitals:   01/07/19 1630 01/07/19 1656 01/07/19 2005 01/08/19 0543  BP: (!) 105/94 135/78 (!) 131/98 117/78  Pulse: 87 88 80 72  Resp: 14 18 16 18   Temp:   98.8 F (37.1 C) 98.6 F (37 C)  TempSrc:   Oral Oral  SpO2: 99% 98% 97% 97%  Weight:      Height:        Intake/Output Summary (Last 24 hours) at 01/08/2019 1014 Last data filed at 01/08/2019 0004 Gross per 24 hour  Intake 771.68 ml  Output 1150 ml  Net -378.32 ml   Filed Weights   01/07/19 0803 01/07/19 1138 01/07/19 1213  Weight: 86.2 kg 84.8 kg 84.8 kg    Telemetry    NSR - Personally Reviewed  ECG    NSR - Personally Reviewed  Physical Exam   GEN: No acute distress.   Neck: No JVD Cardiac: RRR, no murmurs, rubs, or gallops.  Respiratory: Clear to auscultation bilaterally. GI: Soft, nontender, non-distended  MS: No edema; No deformity. R radial cath site without signs of swelling or infection.  Dressing clean dry  and intact. Neuro:  Nonfocal  Psych: Normal affect   Labs    Chemistry Recent Labs  Lab 01/07/19 0804 01/08/19 0455  NA 137 138  K 4.2 4.2  CL 105 106  CO2 24 24  GLUCOSE 120* 105*  BUN 23* 18  CREATININE 0.91 0.90  CALCIUM 8.9 8.8*  GFRNONAA >60 >60  GFRAA >60 >60  ANIONGAP 8 8     Hematology Recent Labs  Lab 01/07/19 0804 01/08/19 0455  WBC 10.4 10.8*  RBC 4.56 4.41  HGB 12.4 11.9*  HCT 38.1 37.6  MCV 83.6 85.3  MCH 27.2 27.0  MCHC 32.5 31.6  RDW 14.6 14.7  PLT 242 229    Cardiac Enzymes Recent Labs  Lab 01/07/19 0804 01/07/19 1716 01/07/19 2149 01/08/19 0455  TROPONINI 0.19* 2.78* 3.27* 3.22*   No results for input(s): TROPIPOC in the last 168 hours.   BNPNo results for input(s): BNP, PROBNP in the last 168 hours.   DDimer No results for input(s): DDIMER in the last 168 hours.   Radiology    Dg Chest 2 View  Result Date: 01/07/2019 CLINICAL DATA:  53 year old with chest pain. EXAM: CHEST - 2 VIEW COMPARISON:  07/21/2010 FINDINGS: Two views of the chest were obtained. Interstitial lung markings are  mildly prominent in the mid and lower chest. No pleural effusions. Slightly increased densities in the anterior chest on the lateral view. Heart and mediastinum are within normal limits. Trachea is midline. Negative for a pneumothorax. IMPRESSION: Slightly enlarged interstitial markings in the mid and lower lung. Findings may represent mild interstitial pulmonary edema and/or scattered areas of mild atelectasis. Electronically Signed   By: Markus Daft M.D.   On: 01/07/2019 08:43    Cardiac Studies   LHC 01/07/2019  The left ventricular systolic function is normal.  LV end diastolic pressure is normal.  The left ventricular ejection fraction is 50-55% by visual estimate.  Prox LAD lesion is 30% stenosed.  Mid LAD lesion is 85% stenosed.  Post intervention, there is a 0% residual stenosis.  A drug-eluting stent was successfully placed using a  STENT RESOLUTE ONYX 2.25X22.  Mid RCA lesion is 40% stenosed.  RPDA lesion is 100% stenosed. 1.  Significant two-vessel coronary artery disease involving mid LAD and occluded mid right PDA with left-to-right collaterals.  The occluded PDA is the likely culprit for myocardial infarction.  However, the vessel is small in size and already with  some left-to-right collaterals. 2.  Normal LV systolic function and high normal left ventricular end-diastolic pressure. 3.  Successful angioplasty and drug-eluting stent placement to the mid LAD. Recommendations: Dual antiplatelet therapy for at least 1 year. Aggressive treatment of risk factors. I added high-dose atorvastatin. Smoking cessation was strongly advised. Possible discharge home tomorrow if no new issues.   Patient Profile     53 y.o. female with a history of tobacco use and HLD who is being seen today for non-ST elevation myocardial infarction s/p cardiac catheterization 01/07/2019 with DESx2 to mLAD.  Assessment & Plan    Non-ST elevation myocardial infarction - No reported chest pain at this time. Intermittent pressure as above and mainly associated with R flank area. No shortness of breath at rest.  - Troponin peaked at 3.27 and currently downtrending.  Initial EKG showed no ischemic changes. -Status post cardiac catheterization 4/27 as above.  LVEF estimated 50 to 55%. Significant two-vessel CAD of mLAD (85%) and mRPDA (100% with L-R collaterals). As noted in cath documentation, suspicion that PDA was culprit for NSTEMI; however, the vessel was too small for stenting and already with collaterals. DES was successfully placed to mLAD. - Continue dual antiplatelet therapy with aspirin 81 mg daily and Brilinta 90 mg twice daily for at least 1 year. Care management has provided with 30 day Brilinta card. Continue Toprol 25 mg daily. Continue high-dose atorvastatin for aggressive treatment of risk factors with outpatient follow-up lipid and  liver function in 6 to 8 weeks.  Continue sublingual nitro as needed for chest pain. -Following ambulation, patient can be discharged home. R radial cath site without signs of edema or infection and with clean, dry, and intact dressing.   - Will need scheduled for follow-up virtual TCM and cardiac rehab.  Plan for medication management follow-up was reviewed with the patient.  Hyperlipidemia - LDL 145.  Started on statin therapy this admission.  Continue with follow-up lipid and liver labs as above.  Goal LDL below 70.   Tobacco use -Cessation advised   For questions or updates, please contact [facility] Please consult [email or web address] for contact info under        Signed, Arvil Chaco, PA-C  01/08/2019, 10:14 AM

## 2019-01-08 NOTE — TOC Benefit Eligibility Note (Signed)
Transition of Care [facility]) Benefit Eligibility Note    Patient Details  Name: Kayla Farmer MRN: [id number] Date of Birth: [birth year]   Medication/Dose: Kary Kos 97m BID  Covered?: No(Requires PA)     Prescription Coverage Preferred Pharmacy: If approved, estimated quote good for any retail pharmacy.    Spoke with Person/Company/Phone Number:: SJudson Rochwith OStark Jock- [phone] Co-Pay: If approved, $47.00 estimated cost for 30 day supply at retail pharmacy and $131.00 estimated cost for 90 day supply mail order.  Prior Approval: Yes(Number for PA: [phone]     Additional Notes: Per rep, no covered alternatives showing on formulary.      HDannette BarbaraPhone Number: [phone] [phone] 19524/28/2020, 10:07 AM

## 2019-01-09 ENCOUNTER — Encounter: Payer: Self-pay | Admitting: Cardiovascular Disease

## 2019-01-09 LAB — HIV ANTIBODY (ROUTINE TESTING W REFLEX): HIV Screen 4th Generation wRfx: NONREACTIVE

## 2019-01-09 NOTE — Telephone Encounter (Signed)
Transition Care Management Follow-up Telephone Call  How have you been since you were released from the hospital? Patient says he is feeling well since discharge, denies any chest pain or SOB . Patient stated she never really felt bad stated" I played softball On Sunday woke up 4 AM my chest killing me." I feel great now.   Do you understand why you were in the hospital? yes   Do you understand the discharge instrcutions? yes  Items Reviewed:  Medications reviewed: yes  Allergies reviewed: yes  Dietary changes reviewed: yes  Referrals reviewed: yes   Functional Questionnaire:   Activities of Daily Living (ADLs):   She states they are independent in the following: ambulation, bathing and hygiene, feeding, continence, grooming, toileting and dressing States they require assistance with the following: No assistance required.   Any transportation issues/concerns?: no   Any patient concerns? no   Confirmed importance and date/time of follow-up visits scheduled: yes   Confirmed with patient if condition begins to worsen call PCP or go to the ER.  Patient was given the Call-a-Nurse line [phone]: yes

## 2019-01-10 ENCOUNTER — Telehealth: Payer: Self-pay | Admitting: Cardiovascular Disease

## 2019-01-10 NOTE — Telephone Encounter (Signed)
TCM....  Patient is being discharged      They are scheduled to see Arida on 5/14 at 1120   They were seen for NSTEMI They need to be seen within 1-2 wks      Patient mentions at time of scheduling that she is having issues.  She wants to know if not having a BM since DC from hospitals normal.

## 2019-01-10 NOTE — Telephone Encounter (Signed)
-----   Message from Wellington Hampshire, MD sent at 01/09/2019  2:52 PM EDT -----  Video TCM follow up needed in 1-2 weeks with me or APP (not sure if this was sent)

## 2019-01-10 NOTE — Telephone Encounter (Signed)
Patient contacted regarding discharge from [facility] on 01/08/2019.  Patient understands to follow up with provider Dr. Fletcher Anon on 01/24/2019 at 11:20 via telehealth visit. Patient understands discharge instructions? yes Patient understands medications and regiment? yes  The patient stated that she is feeling good. She did have a question about the lack of bowel movement but stated that she was able to have one this morning. She has been advised to call back if anything further is needed.

## 2019-01-10 NOTE — Telephone Encounter (Signed)
Virtual Visit Pre-Appointment Phone Call  "(Name), I am calling you today to discuss your upcoming appointment. We are currently trying to limit exposure to the virus that causes COVID-19 by seeing patients at home rather than in the office."  1. "What is the BEST phone number to call the day of the visit?" - include this in appointment notes  2. Do you have or have access to (through a family member/friend) a smartphone with video capability that we can use for your visit?" a. If yes - list this number in appt notes as cell (if different from BEST phone #) and list the appointment type as a VIDEO visit in appointment notes b. If no - list the appointment type as a PHONE visit in appointment notes  3. Confirm consent - "In the setting of the current Covid19 crisis, you are scheduled for a (phone or video) visit with your provider on (date) at (time).  Just as we do with many in-office visits, in order for you to participate in this visit, we must obtain consent.  If you'd like, I can send this to your mychart (if signed up) or email for you to review.  Otherwise, I can obtain your verbal consent now.  All virtual visits are billed to your insurance company just like a normal visit would be.  By agreeing to a virtual visit, we'd like you to understand that the technology does not allow for your provider to perform an examination, and thus may limit your provider's ability to fully assess your condition. If your provider identifies any concerns that need to be evaluated in person, we will make arrangements to do so.  Finally, though the technology is pretty good, we cannot assure that it will always work on either your or our end, and in the setting of a video visit, we may have to convert it to a phone-only visit.  In either situation, we cannot ensure that we have a secure connection.  Are you willing to proceed?" STAFF: Did the patient verbally acknowledge consent to telehealth visit? Document  YES/NO here: YES  4. Advise patient to be prepared - "Two hours prior to your appointment, go ahead and check your blood pressure, pulse, oxygen saturation, and your weight (if you have the equipment to check those) and write them all down. When your visit starts, your provider will ask you for this information. If you have an Apple Watch or Kardia device, please plan to have heart rate information ready on the day of your appointment. Please have a pen and paper handy nearby the day of the visit as well."  5. Give patient instructions for MyChart download to smartphone OR Doximity/Doxy.me as below if video visit (depending on what platform provider is using)  6. Inform patient they will receive a phone call 15 minutes prior to their appointment time (may be from unknown caller ID) so they should be prepared to answer    TELEPHONE CALL NOTE  Kayla Farmer has been deemed a candidate for a follow-up tele-health visit to limit community exposure during the Covid-19 pandemic. I spoke with the patient via phone to ensure availability of phone/video source, confirm preferred email & phone number, and discuss instructions and expectations.  I reminded Rheagan Nayak to be prepared with any vital sign and/or heart rhythm information that could potentially be obtained via home monitoring, at the time of her visit. I reminded Marillyn Goren to expect a phone call prior to her visit.  Clarisse Gouge 01/10/2019 9:17 AM   INSTRUCTIONS FOR DOWNLOADING THE MYCHART APP TO SMARTPHONE  - The patient must first make sure to have activated MyChart and know their login information - If Apple, go to [employer] and type in MyChart in the search bar and download the app. If Android, ask patient to go to [employer] and type in [place] in the search bar and download the app. The app is free but as with any other app downloads, their phone may require them to verify saved payment information or Apple/Android password.  -  The patient will need to then log into the app with their MyChart username and password, and select [facility] as their healthcare provider to link the account. When it is time for your visit, go to the MyChart app, find appointments, and click Begin Video Visit. Be sure to Select Allow for your device to access the Microphone and Camera for your visit. You will then be connected, and your provider will be with you shortly.  **If they have any issues connecting, or need assistance please contact MyChart service desk [phone])**  **If using a computer, in order to ensure the best quality for their visit they will need to use either of the following Internet Browsers: [employer], or Google Chrome**  IF USING DOXIMITY or DOXY.ME - The patient will receive a link just prior to their visit by text.     FULL LENGTH CONSENT FOR TELE-HEALTH VISIT   I hereby voluntarily request, consent and authorize [facility] and its employed or contracted physicians, physician assistants, nurse practitioners or other licensed health care professionals (the Practitioner), to provide me with telemedicine health care services (the Services") as deemed necessary by the treating Practitioner. I acknowledge and consent to receive the Services by the Practitioner via telemedicine. I understand that the telemedicine visit will involve communicating with the Practitioner through live audiovisual communication technology and the disclosure of certain medical information by electronic transmission. I acknowledge that I have been given the opportunity to request an in-person assessment or other available alternative prior to the telemedicine visit and am voluntarily participating in the telemedicine visit.  I understand that I have the right to withhold or withdraw my consent to the use of telemedicine in the course of my care at any time, without affecting my right to future care or treatment, and that the  Practitioner or I may terminate the telemedicine visit at any time. I understand that I have the right to inspect all information obtained and/or recorded in the course of the telemedicine visit and may receive copies of available information for a reasonable fee.  I understand that some of the potential risks of receiving the Services via telemedicine include:   Delay or interruption in medical evaluation due to technological equipment failure or disruption;  Information transmitted may not be sufficient (e.g. poor resolution of images) to allow for appropriate medical decision making by the Practitioner; and/or   In rare instances, security protocols could fail, causing a breach of personal health information.  Furthermore, I acknowledge that it is my responsibility to provide information about my medical history, conditions and care that is complete and accurate to the best of my ability. I acknowledge that Practitioner's advice, recommendations, and/or decision may be based on factors not within their control, such as incomplete or inaccurate data provided by me or distortions of diagnostic images or specimens that may result from electronic transmissions. I understand that the  practice of medicine is not an Chief Strategy Officer and that Practitioner makes no warranties or guarantees regarding treatment outcomes. I acknowledge that I will receive a copy of this consent concurrently upon execution via email to the email address I last provided but may also request a printed copy by calling the office of [facility].    I understand that my insurance will be billed for this visit.   I have read or had this consent read to me.  I understand the contents of this consent, which adequately explains the benefits and risks of the Services being provided via telemedicine.   I have been provided ample opportunity to ask questions regarding this consent and the Services and have had my questions answered to my  satisfaction.  I give my informed consent for the services to be provided through the use of telemedicine in my medical care  By participating in this telemedicine visit I agree to the above.

## 2019-01-11 ENCOUNTER — Other Ambulatory Visit: Payer: Self-pay

## 2019-01-11 NOTE — Patient Outreach (Signed)
[facility]) Care Management  01/11/2019  Kayla Farmer December 12, 1965 [id number]    EMMI-General Discharge RED ON EMMI ALERT Day # 1 Date: 01/10/2019 Red Alert Reason: "Got discharge paperwork? I don't know  Know who to call about changes in condition? No   Outreach attempt # 1 to patient. Call went straight to voicemail recording. RN CM unable to leave a message as voicemail full.      Plan: RN CM will make outreach attempt to patient within 3-4 business days. RN CM will send unsuccessful outreach letter to patient.  Enzo Montgomery, RN,BSN,CCM [facility] Management Telephonic Care Management Coordinator Direct Phone: [phone] Toll Free: [phone] Fax: [phone]

## 2019-01-14 ENCOUNTER — Other Ambulatory Visit: Payer: Self-pay

## 2019-01-14 NOTE — Patient Outreach (Signed)
[facility]) Care Management  01/14/2019  Kayla Farmer July 20, 1966 [id number]   EMMI-General Discharge RED ON EMMI ALERT Day # 1 Date: 01/10/2019 Red Alert Reason: "Got discharge paperwork? I don't know  Know who to call about changes in condition? No   Outreach attempt #2 to patient. Spoke with patient. She voices that she is doing fairly well. patient shares that she has been having some occasional right side pain to her breast that lasts only a few seconds. She voices it has happened about twice. She denies any precipitating factors. She reports that she has follow up appt in place this week. Advised patient to discuss this issue with MD. She reports that she has discharge paperwork somewhere in the home. She reports she knows how to reach her MDs if needed. She has supportive family who is assisting her. She has not been cleared to resume driving yet. Patient states that she has been taking five minutes walks and started a diet to help her overall health. RN CM encouraged and praised patient for doing these things. She denies any further RN CM needs or concerns at this time. Patient has completed post discharge automated calls.     Plan: RN CM will close case at this time as no further interventions needed.   Enzo Montgomery, RN,BSN,CCM [facility] Telephonic Care Management Coordinator Direct Phone: [phone] Toll Free: [phone] Fax: [phone]

## 2019-01-16 ENCOUNTER — Encounter: Payer: Self-pay | Admitting: Internal Medicine

## 2019-01-16 ENCOUNTER — Other Ambulatory Visit: Payer: Self-pay

## 2019-01-16 ENCOUNTER — Ambulatory Visit (INDEPENDENT_AMBULATORY_CARE_PROVIDER_SITE_OTHER): Payer: Medicare Other | Admitting: Internal Medicine

## 2019-01-16 VITALS — BP 104/56 | HR 85 | Wt 183.0 lb

## 2019-01-16 DIAGNOSIS — Z79899 Other long term (current) drug therapy: Secondary | ICD-10-CM

## 2019-01-16 DIAGNOSIS — E785 Hyperlipidemia, unspecified: Secondary | ICD-10-CM | POA: Diagnosis not present

## 2019-01-16 DIAGNOSIS — D126 Benign neoplasm of colon, unspecified: Secondary | ICD-10-CM

## 2019-01-16 DIAGNOSIS — I25110 Atherosclerotic heart disease of native coronary artery with unstable angina pectoris: Secondary | ICD-10-CM

## 2019-01-16 DIAGNOSIS — K760 Fatty (change of) liver, not elsewhere classified: Secondary | ICD-10-CM

## 2019-01-16 DIAGNOSIS — Z87891 Personal history of nicotine dependence: Secondary | ICD-10-CM

## 2019-01-16 DIAGNOSIS — Z09 Encounter for follow-up examination after completed treatment for conditions other than malignant neoplasm: Secondary | ICD-10-CM

## 2019-01-16 DIAGNOSIS — R7303 Prediabetes: Secondary | ICD-10-CM | POA: Diagnosis not present

## 2019-01-16 NOTE — Patient Instructions (Signed)
Take your metoprolol at night instead of in the morning.  START TONIGHT  MAKE A FASTING LAB APPOINTMENT IN 4 WEEKS  DO NOT SMOKE!!  Increase your buspirone to 2-3 times daily if your anxiety is worse  You can elevate your right hand/wrist for the swelling and use ice packs (covered in a thin towel) as needed

## 2019-01-16 NOTE — Progress Notes (Signed)
Virtual Visit via [email or web address]   This visit type was conducted due to national recommendations for restrictions regarding the COVID-19 pandemic (e.g. social distancing).  This format is felt to be most appropriate for this patient at this time.  All issues noted in this document were discussed and addressed.  No physical exam was performed (except for noted visual exam findings with Video Visits).   I connected with@ on 01/16/19 at 11:00 AM EDT by a video enabled telemedicine application and verified that I am speaking with the correct person using two identifiers. Location patient: home Location provider: work Persons participating in the virtual visit: patient, provider  I discussed the limitations, risks, security and privacy concerns of performing an evaluation and management service by telephone and the availability of in person appointments. I also discussed with the patient that there may be a patient responsible charge related to this service. The patient expressed understanding and agreed to proceed.  Reason for visit: HOSPITAL FOLLOW UP following a  recent NSTEMI  HPI:   53 yr old female with history of hypertension fatty liver, obesity and ongoing  tobacco abuse  Admitted to [facility]  On  April 27 with chest pain radiating to left arm that woke her from sleep .   Mildly elevated  troponin,  Taken to cath lab and angioplasty done,  diagnosed with  2 vessel disease: mid LAD  85% stenosed,  Mid RCA 40% . RPDA was 100% occluded with collateral vessels noted. A DES was placed in the LAD with 0% residual stenosis noted post procedure .  EF was normal.  She was discharged on aspirin Brilinta toprol xl and lipitor , and given rx for  Prn ntg .  Cardiology and card rehab referrals made.   Last lipid panel in 2018 , FRC risk calculated at 10% (tobacco , LDL 161).  lowered LDL to 145 by March  2020 panel .  Deferred lipitor  At that time.  Now taking it since her NSTEMI  Last week.  Tolerating meds thus far.   Not smoking    .  She feels generally well , notes that her wrist is slightly swollen and  Tender but has good perfusion of fingers and good radial  pulse based on her report by  exam today .  Has not resumed walking more than 5 minutes in length,  3 times daily .  Denies chest pain , shortness of breth,  Orthopnea, and dizziness.  Appetite fair,  Losing weight intentionally   ROS: See pertinent positives and negatives per HPI.  Past Medical History:  Diagnosis Date  . Anxiety   . Arthritis   . Depression   . GERD (gastroesophageal reflux disease)   . History of cervical cancer   . Other inflammations of eyelids   . Positive colorectal cancer screening using Cologuard test 2019  . Sinus infection     Past Surgical History:  Procedure Laterality Date  . COLONOSCOPY WITH PROPOFOL N/A 01/24/2018   Procedure: COLONOSCOPY WITH PROPOFOL;  Surgeon: Robert Bellow, MD;  Location: [facility] ENDOSCOPY;  Service: Endoscopy;  Laterality: N/A;  . CORONARY STENT INTERVENTION N/A 01/07/2019   Procedure: CORONARY STENT INTERVENTION;  Surgeon: Wellington Hampshire, MD;  Location: [place] [facility];  Service: Cardiovascular;  Laterality: N/A;  LAD  . csection x2    . DILATION AND CURETTAGE OF UTERUS    . LEFT HEART CATH AND CORONARY ANGIOGRAPHY N/A 01/07/2019   Procedure: LEFT HEART CATH AND CORONARY ANGIOGRAPHY;  Surgeon: Wellington Hampshire, MD;  Location: [place] [facility] LAB;  Service: Cardiovascular;  Laterality: N/A;  . LIVER BIOPSY  04/26/2017  . TONSILLECTOMY      Family History  Problem Relation Age of Onset  . Coronary artery disease Mother   . Coronary artery disease Father        smoker  . Breast cancer Neg Hx   . Colon cancer Neg Hx     SOCIAL HX: married with children. Smoked until April 27    Current Outpatient Medications:  .  aspirin EC 81 MG EC tablet, Take 1 tablet (81 mg total) by mouth daily., Disp: 30 tablet, Rfl: 0 .  atorvastatin (LIPITOR) 80 MG tablet, Take 1 tablet  (80 mg total) by mouth daily at 6 PM., Disp: 30 tablet, Rfl: 0 .  busPIRone (BUSPAR) 30 MG tablet, Take 1 tablet (30 mg total) by mouth daily., Disp: , Rfl:  .  metoprolol succinate (TOPROL-XL) 25 MG 24 hr tablet, Take 1 tablet (25 mg total) by mouth daily., Disp: 30 tablet, Rfl: 0 .  nitroGLYCERIN (NITROSTAT) 0.4 MG SL tablet, Place 1 tablet (0.4 mg total) under the tongue every 5 (five) minutes as needed for chest pain., Disp: 30 tablet, Rfl: 0 .  omeprazole (PRILOSEC) 40 MG capsule, Take 1 capsule (40 mg total) by mouth daily., Disp: 90 capsule, Rfl: 1 .  ticagrelor (BRILINTA) 90 MG TABS tablet, Take 1 tablet (90 mg total) by mouth 2 (two) times daily., Disp: 60 tablet, Rfl: 0 .  venlafaxine XR (EFFEXOR-XR) 75 MG 24 hr capsule, TAKE 1 CAPSULE BY MOUTH EVERY DAY, Disp: 90 capsule, Rfl: 1  EXAM:  VITALS per patient if applicable:  GENERAL: alert, oriented, appears well and in no acute distress  HEENT: atraumatic, conjunttiva clear, no obvious abnormalities on inspection of external nose and ears  NECK: normal movements of the head and neck  LUNGS: on inspection no signs of respiratory distress, breathing rate appears normal, no obvious gross SOB, gasping or wheezing  CV: no obvious cyanosis  MS: moves all visible extremities without noticeable abnormality  PSYCH/NEURO: pleasant and cooperative, no obvious depression or anxiety, speech and thought processing grossly intact  ASSESSMENT AND PLAN:  Discussed the following assessment and plan:  Long-term use of high-risk medication - Plan: CBC with Differential/Platelet  Serrated adenoma of colon  Hepatic steatosis - Plan: Comprehensive metabolic panel  Hyperlipidemia LDL goal <70 - Plan: Lipid panel  Prediabetes - Plan: Hemoglobin A1c  History of tobacco abuse  Atherosclerosis of native coronary artery of native heart with unstable angina pectoris [facility])  Hospital discharge follow-up  History of tobacco abuse She has not  smoked since April 27.  Encouraged to continue abstinence and warned of the risk of stent closure if she resumes tobacco abuse.   Hyperlipidemia LDL goal <70 She is tolerating Lipitor and will return for fasting lipids  Hepatic steatosis She his now losing  weight and motivated to exercise since her NSTEMI but afraid to walk more than 5 minutes  And concerned the statin will harm her liver.  Reassured her that statin therapy is part of the treatment for fatty liver (along with weight loss and low glycemic index diet)  Lab Results  Component Value Date   ALT 16 11/14/2018   AST 17 11/14/2018   ALKPHOS 168 (H) 11/14/2018   BILITOT 0.4 11/14/2018     Prediabetes She is now taking a statin and following a low GI diet   Lab  Results  Component Value Date   HGBA1C 5.9 11/14/2018     CAD (coronary atherosclerotic disease) S/p NSTEMI Apri 27 2020 S/o DES mid LAD 85 % stenosis resolved 100% occlusion RDPA 40% stenosis RCA    Hospital discharge follow-up Patient is stable post discharge and has no new issues or questions about discharge plans at the visit today for hospital follow up. All labs , imaging studies and progress notes from admission were reviewed with patient today      I discussed the assessment and treatment plan with the patient. The patient was provided an opportunity to ask questions and all were answered. The patient agreed with the plan and demonstrated an understanding of the instructions.   The patient was advised to call back or seek an in-person evaluation if the symptoms worsen or if the condition fails to improve as anticipated.  I provided  35 minutes of non-face-to-face time during this encounter.   [name], MD

## 2019-01-17 DIAGNOSIS — Z09 Encounter for follow-up examination after completed treatment for conditions other than malignant neoplasm: Secondary | ICD-10-CM | POA: Insufficient documentation

## 2019-01-17 DIAGNOSIS — I2510 Atherosclerotic heart disease of native coronary artery without angina pectoris: Secondary | ICD-10-CM | POA: Insufficient documentation

## 2019-01-17 DIAGNOSIS — I25118 Atherosclerotic heart disease of native coronary artery with other forms of angina pectoris: Secondary | ICD-10-CM | POA: Insufficient documentation

## 2019-01-17 NOTE — Assessment & Plan Note (Signed)
She has not smoked since April 27.  Encouraged to continue abstinence and warned of the risk of stent closure if she resumes tobacco abuse.

## 2019-01-17 NOTE — Assessment & Plan Note (Signed)
She is tolerating Lipitor and will return for fasting lipids

## 2019-01-17 NOTE — Assessment & Plan Note (Signed)
Patient is stable post discharge and has no new issues or questions about discharge plans at the visit today for hospital follow up. All labs , imaging studies and progress notes from admission were reviewed with patient today   

## 2019-01-17 NOTE — Assessment & Plan Note (Signed)
S/p NSTEMI Apri 27 2020 S/o DES mid LAD 85 % stenosis resolved 100% occlusion RDPA 40% stenosis RCA

## 2019-01-17 NOTE — Assessment & Plan Note (Signed)
She is now taking a statin and following a low GI diet   Lab Results  Component Value Date   HGBA1C 5.9 11/14/2018

## 2019-01-17 NOTE — Assessment & Plan Note (Signed)
She his now losing  weight and motivated to exercise since her NSTEMI but afraid to walk more than 5 minutes  And concerned the statin will harm her liver.  Reassured her that statin therapy is part of the treatment for fatty liver (along with weight loss and low glycemic index diet)  Lab Results  Component Value Date   ALT 16 11/14/2018   AST 17 11/14/2018   ALKPHOS 168 (H) 11/14/2018   BILITOT 0.4 11/14/2018

## 2019-01-24 ENCOUNTER — Encounter: Payer: Self-pay | Admitting: Cardiovascular Disease

## 2019-01-24 ENCOUNTER — Other Ambulatory Visit: Payer: Self-pay

## 2019-01-24 ENCOUNTER — Telehealth (INDEPENDENT_AMBULATORY_CARE_PROVIDER_SITE_OTHER): Payer: Medicare Other | Admitting: Cardiovascular Disease

## 2019-01-24 VITALS — BP 99/61 | HR 75 | Ht 63.0 in | Wt 183.1 lb

## 2019-01-24 DIAGNOSIS — I2510 Atherosclerotic heart disease of native coronary artery without angina pectoris: Secondary | ICD-10-CM

## 2019-01-24 DIAGNOSIS — E785 Hyperlipidemia, unspecified: Secondary | ICD-10-CM

## 2019-01-24 MED ORDER — TICAGRELOR 90 MG PO TABS: 90.0000 mg | ORAL_TABLET | Freq: Two times a day (BID) | ORAL | 6 refills | Status: DC

## 2019-01-24 MED ORDER — ATORVASTATIN CALCIUM 80 MG PO TABS: 80.0000 mg | ORAL_TABLET | Freq: Every day | ORAL | 6 refills | Status: DC

## 2019-01-24 MED ORDER — METOPROLOL SUCCINATE ER 25 MG PO TB24: 25.0000 mg | ORAL_TABLET | Freq: Every day | ORAL | 6 refills | Status: DC

## 2019-01-24 NOTE — Progress Notes (Signed)
Virtual Visit via Video Note   This visit type was conducted due to national recommendations for restrictions regarding the COVID-19 Pandemic (e.g. social distancing) in an effort to limit this patient's exposure and mitigate transmission in our community.  Due to her co-morbid illnesses, this patient is at least at moderate risk for complications without adequate follow up.  This format is felt to be most appropriate for this patient at this time.  All issues noted in this document were discussed and addressed.  A limited physical exam was performed with this format.  Please refer to the patient's chart for her consent to telehealth for [facility].   Date:  01/24/2019   ID:  Wardell Heath, DOB 01-16-1966, MRN [id number]  Patient Location: Home Provider Location: Office  PCP:  [name], MD  Cardiologist:  [name], MD  Electrophysiologist:  None   Evaluation Performed:  Follow-Up Visit  Chief Complaint: Mild fatigue  History of Present Illness:    Kayla Farmer is a 53 y.o. female who was seen today for follow-up visit after recent hospitalization for non-ST elevation myocardial infarction.  She has known history of tobacco use, anxiety and family history of coronary artery disease.  She presented last month with non-ST elevation myocardial infarction.  I proceeded with urgent cardiac catheterization which showed significant two-vessel coronary artery disease with an occluded mid right PDA with left-to-right collaterals.  This was felt to be the culprit but the vessel was small in size and already had collaterals and thus left to be treated medically.  There was significant stenosis in the mid LAD which was treated successfully with PCI and drug-eluting stent placement.  Ejection fraction was 50 to 55%.  She has been doing well since hospital discharge with no recurrent chest pain or shortness of breath.  She has been taking her medications regularly and reports easy bruising.   She quit smoking since her cardiac event.  She goes for a walk daily for about 30 minutes.  The patient does not have symptoms concerning for COVID-19 infection (fever, chills, cough, or new shortness of breath).    Past Medical History:  Diagnosis Date   Anxiety    Arthritis    Depression    GERD (gastroesophageal reflux disease)    History of cervical cancer    Other inflammations of eyelids    Positive colorectal cancer screening using Cologuard test 2019   Sinus infection    Past Surgical History:  Procedure Laterality Date   COLONOSCOPY WITH PROPOFOL N/A 01/24/2018   Procedure: COLONOSCOPY WITH PROPOFOL;  Surgeon: Robert Bellow, MD;  Location: [facility] ENDOSCOPY;  Service: Endoscopy;  Laterality: N/A;   CORONARY STENT INTERVENTION N/A 01/07/2019   Procedure: CORONARY STENT INTERVENTION;  Surgeon: Wellington Hampshire, MD;  Location: [place] [facility];  Service: Cardiovascular;  Laterality: N/A;  LAD   csection x2     DILATION AND CURETTAGE OF UTERUS     LEFT HEART CATH AND CORONARY ANGIOGRAPHY N/A 01/07/2019   Procedure: LEFT HEART CATH AND CORONARY ANGIOGRAPHY;  Surgeon: Wellington Hampshire, MD;  Location: [place] [facility] LAB;  Service: Cardiovascular;  Laterality: N/A;   LIVER BIOPSY  04/26/2017   TONSILLECTOMY       Current Meds  Medication Sig   aspirin EC 81 MG EC tablet Take 1 tablet (81 mg total) by mouth daily.   atorvastatin (LIPITOR) 80 MG tablet Take 1 tablet (80 mg total) by mouth daily at 6 PM.  busPIRone (BUSPAR) 30 MG tablet Take 1 tablet (30 mg total) by mouth daily.   metoprolol succinate (TOPROL-XL) 25 MG 24 hr tablet Take 1 tablet (25 mg total) by mouth daily.   nitroGLYCERIN (NITROSTAT) 0.4 MG SL tablet Place 1 tablet (0.4 mg total) under the tongue every 5 (five) minutes as needed for chest pain.   omeprazole (PRILOSEC) 40 MG capsule Take 1 capsule (40 mg total) by mouth daily.   ticagrelor (BRILINTA) 90 MG TABS tablet Take 1 tablet  (90 mg total) by mouth 2 (two) times daily.   venlafaxine XR (EFFEXOR-XR) 75 MG 24 hr capsule TAKE 1 CAPSULE BY MOUTH EVERY DAY     Allergies:   Codeine   Social History   Tobacco Use   Smoking status: Former Smoker    Packs/day: 1.00    Years: 30.00    Pack years: 30.00    Types: Cigarettes    Last attempt to quit: 01/06/2019    Years since quitting: 0.0   Smokeless tobacco: Never Used   Tobacco comment: smokes one pack of cigarettes per day since age 37, quit during pregnancies.  Substance Use Topics   Alcohol use: No    Alcohol/week: 0.0 standard drinks   Drug use: No     Family Hx: The patient's family history includes Coronary artery disease in her father and mother. There is no history of Breast cancer or Colon cancer.  ROS:   Please see the history of present illness.     All other systems reviewed and are negative.   Prior CV studies:   The following studies were reviewed today:    Labs/Other Tests and Data Reviewed:    EKG:  No ECG reviewed.  Recent Labs: 11/14/2018: ALT 16; TSH 2.96 01/08/2019: BUN 18; Creatinine, Ser 0.90; Hemoglobin 11.9; Platelets 229; Potassium 4.2; Sodium 138   Recent Lipid Panel Lab Results  Component Value Date/Time   CHOL 203 (H) 01/08/2019 04:55 AM   TRIG 113 01/08/2019 04:55 AM   HDL 48 01/08/2019 04:55 AM   CHOLHDL 4.2 01/08/2019 04:55 AM   LDLCALC 132 (H) 01/08/2019 04:55 AM   LDLDIRECT 157.0 11/10/2016 10:28 AM    Wt Readings from Last 3 Encounters:  01/24/19 183 lb 2 oz (83.1 kg)  01/16/19 183 lb (83 kg)  01/07/19 187 lb (84.8 kg)     Objective:    Vital Signs:  BP 99/61    Pulse 75    Ht 5' 3"  (1.6 m)    Wt 183 lb 2 oz (83.1 kg)    LMP 10/13/2014 (Approximate)    SpO2 99%    BMI 32.44 kg/m    VITAL SIGNS:  reviewed GEN:  no acute distress EYES:  sclerae anicteric, EOMI - Extraocular Movements Intact RESPIRATORY:  normal respiratory effort, symmetric expansion CARDIOVASCULAR:  no peripheral edema SKIN:   no rash, lesions or ulcers. MUSCULOSKELETAL:  no obvious deformities. NEURO:  alert and oriented x 3, no obvious focal deficit PSYCH:  normal affect I inspected the right arm and wrist area where she had her catheterization.  No significant bruising or swelling noted  ASSESSMENT & PLAN:    1.  Coronary artery disease with recent non-ST elevation myocardial infarction: She is doing very well after her recent myocardial infarction and drug-eluting stent placement.  Continue dual antiplatelet therapy for at least 12 months.  I discussed with her the importance of healthy lifestyle changes and taking her medications regularly.  I am going to refill Brilinta  and metoprolol.  2.  Hyperlipidemia: Continue high-dose atorvastatin.  Follow-up labs were ordered by Dr. Derrel Nip.  Recommend a target LDL of less than 70.  3.  Tobacco use: I congratulated her on smoking cessation.  COVID-19 Education: The signs and symptoms of COVID-19 were discussed with the patient and how to seek care for testing (follow up with PCP or arrange E-visit).  The importance of social distancing was discussed today.  Time:   Today, I have spent 20 minutes with the patient with telehealth technology discussing the above problems.     Medication Adjustments/Labs and Tests Ordered: Current medicines are reviewed at length with the patient today.  Concerns regarding medicines are outlined above.   Tests Ordered: No orders of the defined types were placed in this encounter.   Medication Changes: No orders of the defined types were placed in this encounter.   Disposition:  Follow up in 3 month(s)  Signed, [name], MD  01/24/2019 11:18 AM    [place]

## 2019-01-24 NOTE — Patient Instructions (Signed)
Medication Instructions:  Continue same medications If you need a refill on your cardiac medications before your next appointment, please call your pharmacy.   Lab work: None If you have labs (blood work) drawn today and your tests are completely normal, you will receive your results only by: Marland Kitchen MyChart Message (if you have MyChart) OR . A paper copy in the mail If you have any lab test that is abnormal or we need to change your treatment, we will call you to review the results.  Testing/Procedures: None  Follow-Up: At [facility], you and your health needs are our priority.  As part of our continuing mission to provide you with exceptional heart care, we have created designated Provider Care Teams.  These Care Teams include your primary Cardiologist (physician) and Advanced Practice Providers (APPs -  Physician Assistants and Nurse Practitioners) who all work together to provide you with the care you need, when you need it. You will need a follow up appointment in 3 months.  Please call our office 2 months in advance to schedule this appointment.  You may see [name], MD or one of the following Advanced Practice Providers on your designated Care Team:   Murray Hodgkins, NP Christell Faith, PA-C . Marrianne Mood, PA-C

## 2019-01-31 ENCOUNTER — Ambulatory Visit (INDEPENDENT_AMBULATORY_CARE_PROVIDER_SITE_OTHER): Payer: Medicare Other

## 2019-01-31 ENCOUNTER — Other Ambulatory Visit: Payer: Self-pay

## 2019-01-31 DIAGNOSIS — Z0000 Encounter for general adult medical examination without abnormal findings: Secondary | ICD-10-CM | POA: Diagnosis not present

## 2019-01-31 NOTE — Patient Instructions (Addendum)
  Kayla Farmer , Thank you for taking time to come for your Medicare Wellness Visit. I appreciate your ongoing commitment to your health goals. Please review the following plan we discussed and let me know if I can assist you in the future.   These are the goals we discussed: Goals      Patient Stated   . DIET - INCREASE LEAN PROTEINS (pt-stated)     Lose about 10lb       This is a list of the screening recommended for you and due dates:  Health Maintenance  Topic Date Due  . Flu Shot  04/13/2019  . Mammogram  11/09/2019  . Pap Smear  11/11/2019  . Colon Cancer Screening  01/24/2021  . Tetanus Vaccine  05/29/2023  .  Hepatitis C: One time screening is recommended by Center for Disease Control  (CDC) for  adults born from [birth year] through 1967.   Completed  . HIV Screening  Completed

## 2019-01-31 NOTE — Progress Notes (Addendum)
Subjective:   Kayla Farmer is a 53 y.o. female who presents for Medicare Annual (Subsequent) preventive examination.  Review of Systems:  No ROS.  Medicare Wellness Virtual Visit.  Visual/audio telehealth visit, UTA vital signs.   See social history for additional risk factors.  Cardiac Risk Factors include: advanced age (>23mn, >>26women)     Objective:     Vitals: LMP 10/13/2014 (Approximate)   There is no height or weight on file to calculate BMI.  Advanced Directives 01/31/2019 01/07/2019 01/07/2019 01/24/2018 04/26/2017  Does Patient Have a Medical Advance Directive? No No No No No  Does patient want to make changes to medical advance directive? Yes (MAU/Ambulatory/Procedural Areas - Information given) - - - -  Would patient like information on creating a medical advance directive? - No - Guardian declined No - Guardian declined - -    Tobacco Social History   Tobacco Use  Smoking Status Former Smoker  . Packs/day: 1.00  . Years: 30.00  . Pack years: 30.00  . Types: Cigarettes  . Last attempt to quit: 01/06/2019  . Years since quitting: 0.0  Smokeless Tobacco Never Used  Tobacco Comment   smokes one pack of cigarettes per day since age 53 quit during pregnancies.     Counseling given: Not Answered Comment: smokes one pack of cigarettes per day since age 53 quit during pregnancies.   Clinical Intake:  Pre-visit preparation completed: Yes           How often do you need to have someone help you when you read instructions, pamphlets, or other written materials from your doctor or pharmacy?: 1 - Never  Interpreter Needed?: No     Past Medical History:  Diagnosis Date  . Anxiety   . Arthritis   . Depression   . GERD (gastroesophageal reflux disease)   . History of cervical cancer   . Other inflammations of eyelids   . Positive colorectal cancer screening using Cologuard test 2019  . Sinus infection    Past Surgical History:  Procedure Laterality  Date  . COLONOSCOPY WITH PROPOFOL N/A 01/24/2018   Procedure: COLONOSCOPY WITH PROPOFOL;  Surgeon: BRobert Bellow MD;  Location: [facility] ENDOSCOPY;  Service: Endoscopy;  Laterality: N/A;  . CORONARY STENT INTERVENTION N/A 01/07/2019   Procedure: CORONARY STENT INTERVENTION;  Surgeon: AWellington Hampshire MD;  [facility];  Service: Cardiovascular;  Laterality: N/A;  LAD  . csection x2    . DILATION AND CURETTAGE OF UTERUS    . LEFT HEART CATH AND CORONARY ANGIOGRAPHY N/A 01/07/2019   Procedure: LEFT HEART CATH AND CORONARY ANGIOGRAPHY;  Surgeon: AWellington Hampshire MD;  [facility];  Service: Cardiovascular;  Laterality: N/A;  . LIVER BIOPSY  04/26/2017  . TONSILLECTOMY     Family History  Problem Relation Age of Onset  . Coronary artery disease Mother   . Coronary artery disease Father        smoker  . Breast cancer Neg Hx   . Colon cancer Neg Hx    Social History   Socioeconomic History  . Marital status: Married    Spouse name: Not on file  . Number of children: Not on file  . Years of education: Not on file  . Highest education level: Not on file  Occupational History  . Not on file  Social Needs  . Financial resource strain: Not hard at all  . Food insecurity:    Worry: Never true  Inability: Never true  . Transportation needs:    Medical: No    Non-medical: No  Tobacco Use  . Smoking status: Former Smoker    Packs/day: 1.00    Years: 30.00    Pack years: 30.00    Types: Cigarettes    Last attempt to quit: 01/06/2019    Years since quitting: 0.0  . Smokeless tobacco: Never Used  . Tobacco comment: smokes one pack of cigarettes per day since age 71, quit during pregnancies.  Substance and Sexual Activity  . Alcohol use: No    Alcohol/week: 0.0 standard drinks  . Drug use: No  . Sexual activity: Not on file  Lifestyle  . Physical activity:    Days per week: 5 days    Minutes per session: 30 min  . Stress: Not at all   Relationships  . Social connections:    Talks on phone: Not on file    Gets together: Not on file    Attends religious service: Not on file    Active member of club or organization: Not on file    Attends meetings of clubs or organizations: Not on file    Relationship status: Not on file  Other Topics Concern  . Not on file  Social History Narrative   Lives with spouse and children.  Daughter in Sports coach of Loletha Carrow and Josph Macho.   Quit high school in the 9th grade.  Was raised by grandmother due to parental conflicts.    Outpatient Encounter Medications as of 01/31/2019  Medication Sig  . aspirin EC 81 MG EC tablet Take 1 tablet (81 mg total) by mouth daily.  Marland Kitchen atorvastatin (LIPITOR) 80 MG tablet Take 1 tablet (80 mg total) by mouth daily at 6 PM.  . busPIRone (BUSPAR) 30 MG tablet Take 1 tablet (30 mg total) by mouth daily.  . metoprolol succinate (TOPROL-XL) 25 MG 24 hr tablet Take 1 tablet (25 mg total) by mouth daily.  . nitroGLYCERIN (NITROSTAT) 0.4 MG SL tablet Place 1 tablet (0.4 mg total) under the tongue every 5 (five) minutes as needed for chest pain.  Marland Kitchen omeprazole (PRILOSEC) 40 MG capsule Take 1 capsule (40 mg total) by mouth daily.  . ticagrelor (BRILINTA) 90 MG TABS tablet Take 1 tablet (90 mg total) by mouth 2 (two) times daily.  Marland Kitchen venlafaxine XR (EFFEXOR-XR) 75 MG 24 hr capsule TAKE 1 CAPSULE BY MOUTH EVERY DAY   No facility-administered encounter medications on file as of 01/31/2019.     Activities of Daily Living In your present state of health, do you have any difficulty performing the following activities: 01/31/2019 01/07/2019  Hearing? N N  Vision? N N  Difficulty concentrating or making decisions? N N  Walking or climbing stairs? N N  Dressing or bathing? N N  Doing errands, shopping? N N  Preparing Food and eating ? N -  Using the Toilet? N -  In the past six months, have you accidently leaked urine? N -  Do you have problems with loss of bowel control? N -   Managing your Medications? N -  Managing your Finances? N -  Housekeeping or managing your Housekeeping? N -  Some recent data might be hidden    Patient Care Team: [name], MD as PCP - General (Internal Medicine) Wellington Hampshire, MD as PCP - Cardiology (Cardiology) [name], MD (Internal Medicine) Bary Castilla Forest Gleason, MD (General Surgery)    Assessment:   This is a  routine wellness examination for [facility]  I connected with patient 01/31/19 at 11:30 AM EDT by an audio enabled telemedicine application and verified that I am speaking with the correct person using two identifiers. Patient stated full name and DOB. Patient gave permission to continue with virtual visit. Patient's location was at home and Nurse's location was at [place] office.   Health Screenings  Mammogram - last 04/2018. Scheduled 04/2019.  Colonoscopy - 01/2018 Bone Density - discussed Glaucoma -none Hearing -demonstrates normal hearing during visit. Hemoglobin A1C - 11/2018 Cholesterol - 11/2018  TSH- 11/2018 Dental- every 6 months Vision- visits within the last 12 months.  Social  Alcohol intake - no    Smoking history-  former    Smokers in home? none Illicit drug use? none Exercise - walking daily 30-45 minutes Diet - regular  BMI- discussed the importance of a healthy diet, water intake and the benefits of aerobic exercise.  Educational material provided.   Safety  Patient feels safe at home- yes Patient does have smoke detectors at home- yes Patient does wear sunscreen or protective clothing when in direct sunlight -yes Patient does wear seat belt when in a moving vehicle -yes  Covid-19 precautions and sickness symptoms discussed.   Activities of Daily Living Patient denies needing assistance with: driving, household chores, feeding themselves, getting from bed to chair, getting to the toilet, bathing/showering, dressing, managing money, or preparing meals.  No new identified risk were  noted.    Depression Screen Patient denies losing interest in daily life, feeling hopeless, or crying easily over simple problems.   Medication-taking as directed and without issues.   Fall Screen Patient denies being afraid of falling or falling in the last year.   Memory Screen Patient is alert.  Patient denies difficulty focusing, concentrating or misplacing items. Correctly identified the president of the [place] , season and recall. Patient likes to read, plays computer games, and/or work puzzles for brain stimulation.  Immunizations The following Immunizations were discussed: Influenza, shingles, pneumonia, and tetanus.   Other Providers Patient Care Team: [name], MD as PCP - General (Internal Medicine) Wellington Hampshire, MD as PCP - Cardiology (Cardiology) [name], MD (Internal Medicine) Bary Castilla Forest Gleason, MD (General Surgery)  Exercise Activities and Dietary recommendations Current Exercise Habits: Home exercise routine, Type of exercise: walking, Time (Minutes): 30, Frequency (Times/Week): 5, Weekly Exercise (Minutes/Week): 150, Intensity: Mild  Goals      Patient Stated   . DIET - INCREASE LEAN PROTEINS (pt-stated)     Lose about 10lb       Fall Risk Fall Risk  01/31/2019  Falls in the past year? 0   Depression Screen PHQ 2/9 Scores 01/31/2019 10/22/2018 06/08/2017  PHQ - 2 Score 0 2 5  PHQ- 9 Score - 10 17     Cognitive Function     6CIT Screen 01/31/2019  What Year? 0 points  What month? 0 points  What time? 0 points  Count back from 20 0 points  Months in reverse 0 points  Repeat phrase 0 points  Total Score 0    Immunization History  Administered Date(s) Administered  . Tdap 05/28/2013   Screening Tests Health Maintenance  Topic Date Due  . INFLUENZA VACCINE  04/13/2019  . MAMMOGRAM  11/09/2019  . PAP SMEAR-Modifier  11/11/2019  . COLONOSCOPY  01/24/2021  . TETANUS/TDAP  05/29/2023  . Hepatitis C Screening  Completed  .  HIV Screening  Completed  Plan:   End of life planning; Advance aging; Advanced directives discussed.  Copy of current HCPOA/Living Will requested.    Labs 02/2019  Low GI diet mailed  I have personally reviewed and noted the following in the patient's chart:   . Medical and social history . Use of alcohol, tobacco or illicit drugs  . Current medications and supplements . Functional ability and status . Nutritional status . Physical activity . Advanced directives . List of other physicians . Hospitalizations, surgeries, and ER visits in previous 12 months . Vitals . Screenings to include cognitive, depression, and falls . Referrals and appointments  In addition, I have reviewed and discussed with patient certain preventive protocols, quality metrics, and best practice recommendations. A written personalized care plan for preventive services as well as general preventive health recommendations were provided to patient.     OBrien-Blaney, Trinady Milewski L, LPN  7/99/8721     I have reviewed the above information and agree with above.   Deborra Medina, MD

## 2019-02-11 ENCOUNTER — Other Ambulatory Visit: Payer: Self-pay | Admitting: Internal Medicine

## 2019-02-11 NOTE — Telephone Encounter (Signed)
Copied from [place] [id number]. Topic: Quick Communication - Rx Refill/Question >> Feb 11, 2019 10:29 AM Margot Ables wrote: Medication: busPIRone (BUSPAR) 30 MG tablet - pt is out and refills were not sent after 01/16/2019 visit. Pharmacy advised pt they have already requested and did not get a reply.  Has the patient contacted their pharmacy? yes Preferred Pharmacy (with phone number or street name): [place], [place] [facility] [phone] (Phone) [phone] (Fax)

## 2019-02-11 NOTE — Telephone Encounter (Signed)
Pt is requesting a refill request for buspar. The last time it was filled was when the pt came out of the hospital and it rx is dfor her to take it once daily, however the rxs before have all been for three times daily. The pt stated that she doesn't always take it three times daily but would like to have it just in case she was to need it.

## 2019-02-12 MED ORDER — BUSPIRONE HCL 30 MG PO TABS: 30.0000 mg | ORAL_TABLET | Freq: Three times a day (TID) | ORAL | 2 refills | Status: DC

## 2019-02-13 ENCOUNTER — Other Ambulatory Visit (INDEPENDENT_AMBULATORY_CARE_PROVIDER_SITE_OTHER): Payer: Medicare Other

## 2019-02-13 ENCOUNTER — Other Ambulatory Visit: Payer: Self-pay

## 2019-02-13 DIAGNOSIS — E785 Hyperlipidemia, unspecified: Secondary | ICD-10-CM

## 2019-02-13 DIAGNOSIS — K760 Fatty (change of) liver, not elsewhere classified: Secondary | ICD-10-CM | POA: Diagnosis not present

## 2019-02-13 DIAGNOSIS — Z79899 Other long term (current) drug therapy: Secondary | ICD-10-CM | POA: Diagnosis not present

## 2019-02-13 DIAGNOSIS — R7303 Prediabetes: Secondary | ICD-10-CM

## 2019-02-13 LAB — CBC WITH DIFFERENTIAL/PLATELET
Basophils Absolute: 0.1 10*3/uL (ref 0.0–0.1)
Basophils Relative: 0.8 % (ref 0.0–3.0)
Eosinophils Absolute: 0.2 10*3/uL (ref 0.0–0.7)
Eosinophils Relative: 2.2 % (ref 0.0–5.0)
HCT: 34.9 % — ABNORMAL LOW (ref 36.0–46.0)
Hemoglobin: 11.6 g/dL — ABNORMAL LOW (ref 12.0–15.0)
Lymphocytes Relative: 27.6 % (ref 12.0–46.0)
Lymphs Abs: 2.3 10*3/uL (ref 0.7–4.0)
MCHC: 33.4 g/dL (ref 30.0–36.0)
MCV: 81.6 fl (ref 78.0–100.0)
Monocytes Absolute: 0.4 10*3/uL (ref 0.1–1.0)
Monocytes Relative: 5.2 % (ref 3.0–12.0)
Neutro Abs: 5.4 10*3/uL (ref 1.4–7.7)
Neutrophils Relative %: 64.2 % (ref 43.0–77.0)
Platelets: 223 10*3/uL (ref 150.0–400.0)
RBC: 4.27 Mil/uL (ref 3.87–5.11)
RDW: 14.9 % (ref 11.5–15.5)
WBC: 8.5 10*3/uL (ref 4.0–10.5)

## 2019-02-13 LAB — COMPREHENSIVE METABOLIC PANEL
ALT: 18 U/L (ref 0–35)
AST: 18 U/L (ref 0–37)
Albumin: 3.7 g/dL (ref 3.5–5.2)
Alkaline Phosphatase: 169 U/L — ABNORMAL HIGH (ref 39–117)
BUN: 21 mg/dL (ref 6–23)
CO2: 27 mEq/L (ref 19–32)
Calcium: 8.9 mg/dL (ref 8.4–10.5)
Chloride: 105 mEq/L (ref 96–112)
Creatinine, Ser: 0.92 mg/dL (ref 0.40–1.20)
GFR: 63.35 mL/min (ref >60.00)
Glucose, Bld: 95 mg/dL (ref 70–99)
Potassium: 4 mEq/L (ref 3.5–5.1)
Sodium: 140 mEq/L (ref 135–145)
Total Bilirubin: 0.6 mg/dL (ref 0.2–1.2)
Total Protein: 6.4 g/dL (ref 6.0–8.3)

## 2019-02-13 LAB — HEMOGLOBIN A1C: Hgb A1c MFr Bld: 6.3 % (ref 4.6–6.5)

## 2019-02-13 LAB — LIPID PANEL
Cholesterol: 136 mg/dL (ref 0–200)
HDL: 50.3 mg/dL (ref >39.00)
LDL Cholesterol: 66 mg/dL (ref 0–99)
NonHDL: 85.67
Total CHOL/HDL Ratio: 3
Triglycerides: 99 mg/dL (ref 0.0–149.0)
VLDL: 19.8 mg/dL (ref 0.0–40.0)

## 2019-03-29 ENCOUNTER — Telehealth: Payer: Self-pay | Admitting: Cardiovascular Disease

## 2019-03-29 NOTE — Telephone Encounter (Signed)
Patient calling the office for samples of medication:   1.  What medication and dosage are you requesting samples for? Brilinta 90 MG 1 tablet 2 times daily   2.  Are you currently out of this medication? Will be out on Wednesday   Patient calling Patient will be going out of town next week and will need a Brilinta refill during that time The pharmacy is out of medication and unable to fill before she get's back Would like to know if we have any samples, about 8 to 10 pills, so she will have medication during that time Please call to discuss

## 2019-03-29 NOTE — Telephone Encounter (Signed)
Spoke with patient, notified that we did not have the 90 mg strength  brilinta in samples. She was advised to contact another [facility] and see if they had it in stock.  Patient called back and stated she found it in stock at another [facility] but for 7 pills would cost more than the whole rx.  Suggested she see if there was a [facility] with it in stock near where she was traveling.She seemed unsure of what to do. I contacted [facility] at [facility] and they have the 90 [place] in stock and notified the patient that to pick up her RX just take her insurance information and her bottle to the pharmacy and they would be able to fill it for her.  Patient was very Patent attorney.

## 2019-04-18 ENCOUNTER — Encounter: Payer: Self-pay | Admitting: General Surgery

## 2019-04-22 DIAGNOSIS — I252 Old myocardial infarction: Secondary | ICD-10-CM | POA: Diagnosis not present

## 2019-04-22 DIAGNOSIS — Z1211 Encounter for screening for malignant neoplasm of colon: Secondary | ICD-10-CM | POA: Diagnosis not present

## 2019-04-25 ENCOUNTER — Other Ambulatory Visit: Payer: Self-pay

## 2019-04-25 ENCOUNTER — Ambulatory Visit
Admission: RE | Admit: 2019-04-25 | Discharge: 2019-04-25 | Disposition: A | Discharge status: Home / Self Care | Payer: Medicare Other | Source: Ambulatory Visit | Attending: Internal Medicine | Admitting: Internal Medicine

## 2019-04-25 DIAGNOSIS — Z1231 Encounter for screening mammogram for malignant neoplasm of breast: Secondary | ICD-10-CM | POA: Insufficient documentation

## 2019-04-25 DIAGNOSIS — Z1239 Encounter for other screening for malignant neoplasm of breast: Secondary | ICD-10-CM

## 2019-04-29 ENCOUNTER — Ambulatory Visit: Payer: Self-pay | Admitting: Internal Medicine

## 2019-04-29 ENCOUNTER — Other Ambulatory Visit: Payer: Self-pay

## 2019-04-29 MED ORDER — VENLAFAXINE HCL ER 75 MG PO CP24: ORAL_CAPSULE | ORAL | 1 refills | Status: DC

## 2019-05-09 ENCOUNTER — Other Ambulatory Visit: Payer: Self-pay | Admitting: Internal Medicine

## 2019-05-09 MED ORDER — BUSPIRONE HCL 30 MG PO TABS: 30.0000 mg | ORAL_TABLET | Freq: Three times a day (TID) | ORAL | 2 refills | Status: DC

## 2019-05-09 NOTE — Telephone Encounter (Signed)
Copied from [place] [phone]. Topic: Quick Communication - Rx Refill/Question >> May 09, 2019  2:40 PM Leward Quan A wrote: Medication: busPIRone (BUSPAR) 30 MG tablet  Patient asking for Rx to be sent today please   Has the patient contacted their pharmacy? Yes.   (Agent: If no, request that the patient contact the pharmacy for the refill.) (Agent: If yes, when and what did the pharmacy advise?)  Preferred Pharmacy (with phone number or street name): [place], [place] [facility] [place] [facility] [phone] (Phone) [phone] (Fax)    Agent: Please be advised that RX refills may take up to 3 business days. We ask that you follow-up with your pharmacy.

## 2019-07-05 ENCOUNTER — Telehealth: Payer: Self-pay

## 2019-07-05 MED ORDER — TICAGRELOR 90 MG PO TABS: 90.0000 mg | ORAL_TABLET | Freq: Two times a day (BID) | ORAL | 0 refills | Status: DC

## 2019-07-05 NOTE — Telephone Encounter (Signed)
Requested Prescriptions   Signed Prescriptions Disp Refills  . ticagrelor (BRILINTA) 90 MG TABS tablet 60 tablet 0    Sig: Take 1 tablet (90 mg total) by mouth 2 (two) times daily. *PLEASE CALL [phone] TO SCHEDULE APPOINTMENT FOR FURTHER REFILLS.*    Authorizing Provider: [name] A    Ordering User: Raelene Bott, Daley Mooradian L

## 2019-07-25 ENCOUNTER — Ambulatory Visit (INDEPENDENT_AMBULATORY_CARE_PROVIDER_SITE_OTHER): Payer: Medicare Other | Admitting: Nurse Practitioner

## 2019-07-25 ENCOUNTER — Other Ambulatory Visit: Payer: Self-pay

## 2019-07-25 ENCOUNTER — Encounter: Payer: Self-pay | Admitting: Nurse Practitioner

## 2019-07-25 VITALS — BP 110/80 | HR 73 | Temp 97.5°F | Ht 63.0 in | Wt 196.2 lb

## 2019-07-25 DIAGNOSIS — Z87891 Personal history of nicotine dependence: Secondary | ICD-10-CM

## 2019-07-25 DIAGNOSIS — I739 Peripheral vascular disease, unspecified: Secondary | ICD-10-CM | POA: Diagnosis not present

## 2019-07-25 DIAGNOSIS — I2510 Atherosclerotic heart disease of native coronary artery without angina pectoris: Secondary | ICD-10-CM

## 2019-07-25 DIAGNOSIS — E785 Hyperlipidemia, unspecified: Secondary | ICD-10-CM

## 2019-07-25 DIAGNOSIS — R0609 Other forms of dyspnea: Secondary | ICD-10-CM

## 2019-07-25 DIAGNOSIS — R0600 Dyspnea, unspecified: Secondary | ICD-10-CM

## 2019-07-25 MED ORDER — TICAGRELOR 90 MG PO TABS: 90.0000 mg | ORAL_TABLET | Freq: Two times a day (BID) | ORAL | 6 refills | Status: DC

## 2019-07-25 NOTE — Patient Instructions (Signed)
Medication Instructions:  Your physician recommends that you continue on your current medications as directed. Please refer to the Current Medication list given to you today.  *If you need a refill on your cardiac medications before your next appointment, please call your pharmacy*  Lab Work: None ordered  If you have labs (blood work) drawn today and your tests are completely normal, you will receive your results only by: Marland Kitchen MyChart Message (if you have MyChart) OR . A paper copy in the mail If you have any lab test that is abnormal or we need to change your treatment, we will call you to review the results.  Testing/Procedures: 1- Echo  Please return to [facility] on ______________ at _______________ AM/PM for an Echocardiogram. Your physician has requested that you have an echocardiogram. Echocardiography is a painless test that uses sound waves to create images of your heart. It provides your doctor with information about the size and shape of your heart and how well your heart's chambers and valves are working. This procedure takes approximately one hour. There are no restrictions for this procedure. Please note; depending on visual quality an IV may need to be placed.   2- Your physician has requested that you have an ankle brachial index (ABI). During this test an ultrasound and blood pressure cuff are used to evaluate the arteries that supply the arms and legs with blood. Allow thirty minutes for this exam. There are no restrictions or special instructions.    Follow-Up: At [facility], you and your health needs are our priority.  As part of our continuing mission to provide you with exceptional heart care, we have created designated Provider Care Teams.  These Care Teams include your primary Cardiologist (physician) and Advanced Practice Providers (APPs -  Physician Assistants and Nurse Practitioners) who all work together to provide you with the care you need, when  you need it.  Your next appointment:   1 month  The format for your next appointment:   In Person  Provider:    Please see [name], MD   Other Instructions

## 2019-07-25 NOTE — Progress Notes (Signed)
Office Visit    Patient Name: Kayla Farmer Date of Encounter: 07/25/2019  Primary Care Provider:  [name], MD Primary Cardiologist:  [name], MD  Chief Complaint    53 y/o ? w/ a h/o CAD s/p NSTEMI and DES  LAD in 12/2018, family history of premature CAD, prior tobacco abuse, hyperlipidemia, anxiety, depression, and GERD, who presents for follow-up with complaints of dyspnea on exertion and lower extremity claudication.  Past Medical History    Past Medical History:  Diagnosis Date  . Anxiety   . Arthritis   . CAD (coronary artery disease)    a. 12/2018 NSTEMI/PCI: LM nl, LAD 30p, 21m(2.25x22 Resolute Onyx DES), LCX nl, OM2 nl, RCA 455mRPDA 100 w/ L->R collats (likely culprit - med rx). EF 50-55%.  . [place]   a. 07/2019 - L>R.  . Depression   . GERD (gastroesophageal reflux disease)   . History of cervical cancer   . History of tobacco abuse    a. 30 pack year - quit 12/2018 @ time of NSTEMI.  . Obesity   . Other inflammations of eyelids   . Positive colorectal cancer screening using Cologuard test 2019  . Sinus infection    Past Surgical History:  Procedure Laterality Date  . CARDIAC CATHETERIZATION    . COLONOSCOPY WITH PROPOFOL N/A 01/24/2018   Procedure: COLONOSCOPY WITH PROPOFOL;  Surgeon: ByRobert BellowMD;  Location: [facility];  Service: Endoscopy;  Laterality: N/A;  . CORONARY STENT INTERVENTION N/A 01/07/2019   Procedure: CORONARY STENT INTERVENTION;  Surgeon: ArWellington HampshireMD;  [facility];  Service: Cardiovascular;  Laterality: N/A;  LAD  . csection x2    . DILATION AND CURETTAGE OF UTERUS    . LEFT HEART CATH AND CORONARY ANGIOGRAPHY N/A 01/07/2019   Procedure: LEFT HEART CATH AND CORONARY ANGIOGRAPHY;  Surgeon: ArWellington HampshireMD;  [facility];  Service: Cardiovascular;  Laterality: N/A;  . LIVER BIOPSY  04/26/2017  . TONSILLECTOMY      Allergies  Allergies  Allergen  Reactions  . Codeine Itching    History of Present Illness    [age]ear old female with the above past medical history including coronary artery disease, family history of premature CAD, prior tobacco abuse, hyperlipidemia, anxiety, depression, and GERD.  In April 2020, [facility] and ruled in for non-STEMI.  Diagnostic catheterization revealed two-vessel CAD with an occluded mid right PDA with left-to-right collaterals as well as an 85% stenosis in the mid LAD.  LV function was normal.  The PDA was felt to be the culprit though this vessel was small and already had collaterals and thus was treated medically.  The LAD was successfully intervened upon using a resolute Onyx drug-eluting stent.  She has since been maintained on aspirin, Brilinta, beta-blocker, and statin therapy.  She was last seen via telemedicine visit in May.  Since her last visit, she has not had any chest pain.  She notes that her activity level has fallen off some since her MI.  Prior to her MI, she was walking 1 to 2 miles regularly without any symptoms or limitations.  Since however, she has been quite inactive and has gained weight (13 pounds).  In the setting of inactivity and weight gain, she has noted dyspnea on exertion when walking up inclines.  She generally does okay on flat surfaces.  She has not noticed any  PND, orthopnea, edema, or early satiety.  With walking, she does experience right greater than left thigh and upper anterior lower leg discomfort and burning, stating that it feels like her leg will give out.  She has not noted any change in color or hair distribution of her lower legs.  She remains off of cigarettes.  She has not had any dizziness, syncope, or palpitations.  She notes that she continues to go out in public regularly without wearing a mask because she does not believe that masks are effective and she is not concerned about COVID-19.  Home Medications    Prior to  Admission medications   Medication Sig Start Date End Date Taking? Authorizing Provider  aspirin EC 81 MG EC tablet Take 1 tablet (81 mg total) by mouth daily. 01/08/19  Yes Loletha Grayer, MD  atorvastatin (LIPITOR) 80 MG tablet Take 1 tablet (80 mg total) by mouth daily at 6 PM. 01/24/19  Yes Arida, Mertie Clause, MD  busPIRone (BUSPAR) 30 MG tablet Take 1 tablet (30 mg total) by mouth 3 (three) times daily. As needed for anxiety 05/09/19 08/07/19 Yes [name], MD  metoprolol succinate (TOPROL-XL) 25 MG 24 hr tablet Take 1 tablet (25 mg total) by mouth daily. 01/24/19  Yes Wellington Hampshire, MD  nitroGLYCERIN (NITROSTAT) 0.4 MG SL tablet Place 1 tablet (0.4 mg total) under the tongue every 5 (five) minutes as needed for chest pain. 01/08/19  Yes Wieting, Richard, MD  omeprazole (PRILOSEC) 40 MG capsule Take 1 capsule (40 mg total) by mouth daily. 11/13/18  Yes [name], MD  ticagrelor (BRILINTA) 90 MG TABS tablet Take 1 tablet (90 mg total) by mouth 2 (two) times daily. *PLEASE CALL [phone] TO SCHEDULE APPOINTMENT FOR FURTHER REFILLS.* 07/05/19  Yes Wellington Hampshire, MD  venlafaxine XR (EFFEXOR-XR) 75 MG 24 hr capsule TAKE 1 CAPSULE BY MOUTH EVERY DAY 04/29/19  Yes [name], MD    Review of Systems    Dyspnea on exertion, specifically with inclines.  She does not feel that her exercise tolerance is back to where it was prior to her MI.  She also notes right greater than left bilateral lower extremity claudication.  She denies chest pain, palpitations, PND, orthopnea, dizziness, syncope, edema, or early satiety.  All other systems reviewed and are otherwise negative except as noted above.  Physical Exam    VS:  BP 110/80 (BP Location: Left Arm, Patient Position: Sitting, Cuff Size: Normal)   Pulse 73   Temp (!) 97.5 F (36.4 C)   Ht 5' 3"  (1.6 m)   Wt 196 lb 4 oz (89 kg)   LMP 10/13/2014 (Approximate)   SpO2 97%   BMI 34.76 kg/m  , BMI Body mass index is 34.76 kg/m.  GEN: Well nourished, well developed, in no acute distress. HEENT: normal. Neck: Supple, no JVD, carotid bruits, or masses. Cardiac: RRR, no murmurs, rubs, or gallops. No clubbing, cyanosis, edema.  Radials/DP/PT 2+ and equal bilaterally.  Respiratory:  Respirations regular and unlabored, clear to auscultation bilaterally. GI: Soft, nontender, nondistended, BS + x 4. MS: no deformity or atrophy. Skin: warm and dry, no rash. Neuro:  Strength and sensation are intact. Psych: Normal affect.  Accessory Clinical Findings    ECG personally reviewed by me today -regular sinus rhythm, 73 - no acute changes.  Lab Results  Component Value Date   WBC 8.5 02/13/2019   HGB 11.6 (L) 02/13/2019   HCT 34.9 (L) 02/13/2019  MCV 81.6 02/13/2019   PLT 223.0 02/13/2019   Lab Results  Component Value Date   CREATININE 0.92 02/13/2019   BUN 21 02/13/2019   NA 140 02/13/2019   K 4.0 02/13/2019   CL 105 02/13/2019   CO2 27 02/13/2019   Lab Results  Component Value Date   ALT 18 02/13/2019   AST 18 02/13/2019   ALKPHOS 169 (H) 02/13/2019   BILITOT 0.6 02/13/2019   Lab Results  Component Value Date   CHOL 136 02/13/2019   HDL 50.30 02/13/2019   LDLCALC 66 02/13/2019   LDLDIRECT 157.0 11/10/2016   TRIG 99.0 02/13/2019   CHOLHDL 3 02/13/2019     Assessment & Plan    1.  Coronary artery disease: Status post non-STEMI in April 2020 with finding of an occluded RPDA with left-to-right collaterals as well as severe disease in the mid LAD.  The mid LAD was successfully intervened upon using a drug-eluting stent.  She has not had any chest pain since her event.  She has noted dyspnea on exertion and reduced exercise tolerance ever since her MI.  This has not gotten worse.  She also admits to being less active overall and 13 pound weight gain.  I will follow-up an echocardiogram given dyspnea on exertion and provided that EF is normal, I would recommend a dedicated exercise program.  In the setting of  COVID-19, she did not participate in cardiac rehabilitation though this may provide a benefit now.  Continue aspirin, Brilinta, statin, and beta-blocker therapy.  If EF down on echo, will need to pursue ischemic testing.  2.  Dyspnea on exertion: See #1.  Follow-up echo.  3.  Lower extremity claudication: Right greater than left.  This is been going on for several months and notes that her right thigh and anterior tibial area begins to burn with ambulation and feels as though it might give out.  She has some degree of chronic left hip and buttock numbness when on her feet for a long period time.  She has good distal pulses.  Follow-up ABIs.  4.  Hyperlipidemia: LDL 66 in June.  Continue high potency statin therapy.  5.  History of tobacco abuse: Quit in April and has not resumed.  Encouraged her to remain off of cigarettes.  6.  Disposition: Follow-up echo and ABIs.  Follow-up in clinic with Dr. Fletcher Anon in 1 month.  Of note, I did counsel her today on the importance of appropriate social distancing and mask wearing in public.   Murray Hodgkins, NP 07/25/2019, 10:11 AM

## 2019-08-15 ENCOUNTER — Telehealth: Payer: Self-pay

## 2019-08-15 DIAGNOSIS — M79661 Pain in right lower leg: Secondary | ICD-10-CM

## 2019-08-15 NOTE — Telephone Encounter (Signed)
Copied from [place] [phone]. Topic: Appointment Scheduling - Prior Auth Required for Appointment >> Aug 15, 2019 10:17 AM Robina Ade, Helene Kelp D wrote: No appointment has been scheduled. Patient is requesting a work-in or same day appointment for right leg pain with knot. Per scheduling protocol, this appointment requires a prior authorization prior to scheduling.  Route to department's PEC pool.

## 2019-08-15 NOTE — Telephone Encounter (Signed)
Please see note.

## 2019-08-15 NOTE — Telephone Encounter (Signed)
I am ordering an ultrasound for DVT rule out to be done tomorrow or Monday morning  .  She should keep the Monday appt either way and it should be face to face

## 2019-08-15 NOTE — Telephone Encounter (Signed)
Spoke with pt and she stated that she has a knot on the back of her right leg below her knee. She stated that it has been there since the week of Thanksgiving. She stated that it does hurt but only when touching it, laying it flat or stretching out her leg and it is bruised. Pt stated that it is not red, warm to the touch, not having any SOBr or chest pains. Pt was advised that she should be evaluated by UC or the ED and pt stated that she did not want to do that. Pt is scheduled for a virtual visit on Monday 08/19/2019 with Dr. Derrel Nip. Does this need to stay virtual or does pt need to come in the office?

## 2019-08-15 NOTE — Telephone Encounter (Signed)
Copied from [place] [phone]. Topic: Appointment Scheduling - Prior Auth Required for Appointment >> Aug 15, 2019 10:17 AM Kayla Farmer, Helene Kelp D wrote: No appointment has been scheduled. Patient is requesting a work-in or same day appointment for right leg pain with knot. Per scheduling protocol, this appointment requires a prior authorization prior to scheduling.  Route to department's PEC pool.

## 2019-08-15 NOTE — Telephone Encounter (Signed)
Please see note. Where could we schedule?

## 2019-08-16 ENCOUNTER — Other Ambulatory Visit: Payer: Self-pay

## 2019-08-16 ENCOUNTER — Telehealth: Payer: Self-pay | Admitting: Internal Medicine

## 2019-08-16 ENCOUNTER — Other Ambulatory Visit: Payer: Self-pay | Admitting: Internal Medicine

## 2019-08-16 ENCOUNTER — Ambulatory Visit
Admission: RE | Admit: 2019-08-16 | Discharge: 2019-08-16 | Disposition: A | Discharge status: Home / Self Care | Payer: Medicare Other | Source: Ambulatory Visit | Attending: Internal Medicine | Admitting: Internal Medicine

## 2019-08-16 DIAGNOSIS — M79604 Pain in right leg: Secondary | ICD-10-CM

## 2019-08-16 DIAGNOSIS — R600 Localized edema: Secondary | ICD-10-CM | POA: Diagnosis not present

## 2019-08-16 MED ORDER — SULFAMETHOXAZOLE-TRIMETHOPRIM 800-160 MG PO TABS: 1.0000 | ORAL_TABLET | Freq: Two times a day (BID) | ORAL | 0 refills | Status: DC

## 2019-08-16 NOTE — Telephone Encounter (Signed)
Patient notified and will do as PCP suggested and Dr. Nicki Reaper aware.

## 2019-08-16 NOTE — Telephone Encounter (Signed)
Spoke to [place].  She will call pt with Dr Lupita Dawn recs.

## 2019-08-16 NOTE — Progress Notes (Signed)
Order placed for right leg ultrasound.

## 2019-08-16 NOTE — Telephone Encounter (Signed)
Ultrasound was negative for DVT,  But radiologist could not identify whether what she has is an abscess or a hematoma.  I would like to put her on an antibiotic until I can see her on Monday I will call one in to her pharmacy   Please take a probiotic ( Align, Floraque or Culturelle) of the generic version of one of these  For a minimum of 3 weeks to prevent a serious antibiotic associated diarrhea  Called clostridium dificile colitis   I have copied Dr Nicki Reaper on this message so she does not need to call patient

## 2019-08-19 ENCOUNTER — Encounter: Payer: Self-pay | Admitting: Internal Medicine

## 2019-08-19 ENCOUNTER — Ambulatory Visit (INDEPENDENT_AMBULATORY_CARE_PROVIDER_SITE_OTHER): Payer: Medicare Other | Admitting: Internal Medicine

## 2019-08-19 DIAGNOSIS — T148XXA Other injury of unspecified body region, initial encounter: Secondary | ICD-10-CM | POA: Diagnosis not present

## 2019-08-19 NOTE — Progress Notes (Signed)
Virtual Visit via [email or web address]  This visit type was conducted due to national recommendations for restrictions regarding the COVID-19 pandemic (e.g. social distancing).  This format is felt to be most appropriate for this patient at this time.  All issues noted in this document were discussed and addressed.  No physical exam was performed (except for noted visual exam findings with Video Visits).   I connected with@ on 08/19/19 at  3:00 PM EST by a video enabled telemedicine application and verified that I am speaking with the correct person using two identifiers. Location patient: home Location provider: work or home office Persons participating in the virtual visit: patient, provider  I discussed the limitations, risks, security and privacy concerns of performing an evaluation and management service by telephone and the availability of in person appointments. I also discussed with the patient that there may be a patient responsible charge related to this service. The patient expressed understanding and agreed to proceed.  Reason for visit: follow up on painful ecchymosis on right medial calf.   HPI:  53 yr old female with CAD s/p NSTEMI April 2020 presents for follow up on LE U/s done on Friday to evaluate a pain fu ecchymotic knot that became symptomatic the week before Thanksgiving.  She recalls no history of trauma,  But takes a DOAC (Brilinta) and notes excessive bruising with minimal trauma to skin.    U/s was negative for DVT ,  Lump was appreciated as either abscess or hematoma.  She was advised to tae Septra DS over the weekend until she could be seen.  Has been taking medication without side effects.  Lump still present,  Bruise is fading  . Slightly less tender   ROS: See pertinent positives and negatives per HPI.  Past Medical History:  Diagnosis Date  . Anxiety   . Arthritis   . CAD (coronary artery disease)    a. 12/2018 NSTEMI/PCI: LM nl, LAD 30p, 34m(2.25x22 Resolute Onyx  DES), LCX nl, OM2 nl, RCA 446mRPDA 100 w/ L->R collats (likely culprit - med rx). EF 50-55%.  . [place]ication ([place]   a. 07/2019 - L>R.  . Depression   . GERD (gastroesophageal reflux disease)   . History of cervical cancer   . History of tobacco abuse    a. 30 pack year - quit 12/2018 @ time of NSTEMI.  . Obesity   . Other inflammations of eyelids   . Positive colorectal cancer screening using Cologuard test 2019  . Sinus infection     Past Surgical History:  Procedure Laterality Date  . CARDIAC CATHETERIZATION    . COLONOSCOPY WITH PROPOFOL N/A 01/24/2018   Procedure: COLONOSCOPY WITH PROPOFOL;  Surgeon: ByRobert BellowMD;  Location: [facility];  Service: Endoscopy;  Laterality: N/A;  . CORONARY STENT INTERVENTION N/A 01/07/2019   Procedure: CORONARY STENT INTERVENTION;  Surgeon: ArWellington HampshireMD;  [facility];  Service: Cardiovascular;  Laterality: N/A;  LAD  . csection x2    . DILATION AND CURETTAGE OF UTERUS    . LEFT HEART CATH AND CORONARY ANGIOGRAPHY N/A 01/07/2019   Procedure: LEFT HEART CATH AND CORONARY ANGIOGRAPHY;  Surgeon: ArWellington HampshireMD;  [facility];  Service: Cardiovascular;  Laterality: N/A;  . LIVER BIOPSY  04/26/2017  . TONSILLECTOMY      Family History  Problem Relation Age of Onset  . Coronary artery disease Mother   . Coronary artery disease Father  smoker  . Breast cancer Neg Hx   . Colon cancer Neg Hx     SOCIAL HX:  reports that she quit smoking about 7 months ago. Her smoking use included cigarettes. She has a 30.00 pack-year smoking history. She has never used smokeless tobacco. She reports that she does not drink alcohol or use drugs.   Current Outpatient Medications:  .  aspirin EC 81 MG EC tablet, Take 1 tablet (81 mg total) by mouth daily., Disp: 30 tablet, Rfl: 0 .  atorvastatin (LIPITOR) 80 MG tablet, Take 1 tablet (80 mg total) by mouth daily at 6 PM., Disp: 30 tablet, Rfl: 6 .   busPIRone (BUSPAR) 30 MG tablet, Take 1 tablet (30 mg total) by mouth 3 (three) times daily. As needed for anxiety, Disp: 90 tablet, Rfl: 2 .  metoprolol succinate (TOPROL-XL) 25 MG 24 hr tablet, Take 1 tablet (25 mg total) by mouth daily., Disp: 30 tablet, Rfl: 6 .  nitroGLYCERIN (NITROSTAT) 0.4 MG SL tablet, Place 1 tablet (0.4 mg total) under the tongue every 5 (five) minutes as needed for chest pain., Disp: 30 tablet, Rfl: 0 .  omeprazole (PRILOSEC) 40 MG capsule, Take 1 capsule (40 mg total) by mouth daily., Disp: 90 capsule, Rfl: 1 .  sulfamethoxazole-trimethoprim (BACTRIM DS) 800-160 MG tablet, Take 1 tablet by mouth 2 (two) times daily., Disp: 14 tablet, Rfl: 0 .  ticagrelor (BRILINTA) 90 MG TABS tablet, Take 1 tablet (90 mg total) by mouth 2 (two) times daily. *PLEASE CALL [phone] TO SCHEDULE APPOINTMENT FOR FURTHER REFILLS.*, Disp: 60 tablet, Rfl: 6 .  venlafaxine XR (EFFEXOR-XR) 75 MG 24 hr capsule, TAKE 1 CAPSULE BY MOUTH EVERY DAY, Disp: 90 capsule, Rfl: 1  EXAM:  VITALS per patient if applicable:  GENERAL: alert, oriented, appears well and in no acute distress  HEENT: atraumatic, conjunttiva clear, no obvious abnormalities on inspection of external nose and ears  NECK: normal movements of the head and neck  LUNGS: on inspection no signs of respiratory distress, breathing rate appears normal, no obvious gross SOB, gasping or wheezing  CV: no obvious cyanosis  MS: moves all visible extremities without noticeable abnormality  EXT: right medial calf with fading ecchymosis ,  No signs of trauma or edema.   PSYCH/NEURO: pleasant and cooperative, no obvious depression or anxiety, speech and thought processing grossly intact  ASSESSMENT AND PLAN:  Discussed the following assessment and plan:  Hematoma  Hematoma Right medial calf.  Elevate, ice,  D/c antibiotics     I discussed the assessment and treatment plan with the patient. The patient was provided an opportunity  to ask questions and all were answered. The patient agreed with the plan and demonstrated an understanding of the instructions.   The patient was advised to call back or seek an in-person evaluation if the symptoms worsen or if the condition fails to improve as anticipated.   I provided  25 minutes of non-face-to-face time during this encounter reviewing patient's current problems and past procedures/imaging studies, providing counseling on the above mentioned problems , and coordination  of care .   [name], MD

## 2019-08-19 NOTE — Assessment & Plan Note (Signed)
Right medial calf.  Elevate, ice,  D/c antibiotics

## 2019-08-22 ENCOUNTER — Telehealth: Payer: Self-pay

## 2019-08-22 NOTE — Telephone Encounter (Signed)
Copied from [place] [id number]. Topic: Appointment Scheduling - Prior Auth Required for Appointment >> Aug 15, 2019 10:17 AM Kayla Farmer, Helene Kelp D wrote: No appointment has been scheduled. Patient is requesting a work-in or same day appointment for right leg pain with knot. Per scheduling protocol, this appointment requires a prior authorization prior to scheduling.  Route to department's PEC pool.

## 2019-08-26 ENCOUNTER — Ambulatory Visit (INDEPENDENT_AMBULATORY_CARE_PROVIDER_SITE_OTHER): Payer: Medicare Other

## 2019-08-26 ENCOUNTER — Other Ambulatory Visit: Payer: Self-pay

## 2019-08-26 ENCOUNTER — Telehealth: Payer: Self-pay

## 2019-08-26 DIAGNOSIS — I739 Peripheral vascular disease, unspecified: Secondary | ICD-10-CM

## 2019-08-26 DIAGNOSIS — R0609 Other forms of dyspnea: Secondary | ICD-10-CM

## 2019-08-26 DIAGNOSIS — R0600 Dyspnea, unspecified: Secondary | ICD-10-CM | POA: Diagnosis not present

## 2019-08-26 NOTE — Telephone Encounter (Signed)
Call to patient to discuss abi results. She verbalized udnerstanding and had no further questions at this time. Confirmed follow up with Dr. Fletcher Anon later this week.   Advised pt to call for any further questions or concerns.

## 2019-08-26 NOTE — Telephone Encounter (Signed)
-----   Message from Theora Gianotti, NP sent at 08/26/2019 11:15 AM EST ----- Doristine Devoid news.  ABI's are normal.  No evidence of blockage in arteries supplying the legs

## 2019-08-28 ENCOUNTER — Telehealth: Payer: Self-pay

## 2019-08-28 NOTE — Telephone Encounter (Signed)
-----   Message from Theora Gianotti, NP sent at 08/28/2019 11:33 AM EST ----- Heart squeezing function is normal.  No significant valvular disease.  With these reassuring findings, as we discussed in clinic in November, I encourage her to begin a regular exercise/walking routine.  Ideally, she should try and get 30 mins of activity daily.

## 2019-08-28 NOTE — Telephone Encounter (Signed)
Call to patient to review echo results and POC from Ignacia Bayley, NP.   No further questions or orders at this time.   confirmed upcoming appt with Dr. Fletcher Anon on 08/30/19.  Advised pt to call for any further questions or concerns.

## 2019-08-30 ENCOUNTER — Ambulatory Visit (INDEPENDENT_AMBULATORY_CARE_PROVIDER_SITE_OTHER): Payer: Medicare Other | Admitting: Cardiovascular Disease

## 2019-08-30 ENCOUNTER — Other Ambulatory Visit: Payer: Self-pay

## 2019-08-30 ENCOUNTER — Telehealth: Payer: Self-pay | Admitting: Cardiovascular Disease

## 2019-08-30 ENCOUNTER — Encounter: Payer: Self-pay | Admitting: Cardiovascular Disease

## 2019-08-30 VITALS — BP 118/72 | HR 106 | Ht 63.0 in | Wt 201.0 lb

## 2019-08-30 DIAGNOSIS — E785 Hyperlipidemia, unspecified: Secondary | ICD-10-CM

## 2019-08-30 DIAGNOSIS — I2510 Atherosclerotic heart disease of native coronary artery without angina pectoris: Secondary | ICD-10-CM | POA: Diagnosis not present

## 2019-08-30 NOTE — Telephone Encounter (Signed)
   Primary Cardiologist: [name], MD  Chart reviewed as part of pre-operative protocol coverage. Simple dental extractions are considered low risk procedures per guidelines and generally do not require any specific cardiac clearance. It is also generally accepted that for simple extractions and dental cleanings, there is no need to interrupt blood thinner therapy.   She requires dual antiplatelet therapy (DAPT) until at least the end of April 2021. This includes aspirin 64m daily and ricagrelor 90by twice daily.  SBE prophylaxis is not required for the patient.  I will route this recommendation to the requesting party via Epic fax function and remove from pre-op pool.  Please call with questions.  CLoel Dubonnet NP 08/30/2019, 2:29 PM

## 2019-08-30 NOTE — Patient Instructions (Signed)
Medication Instructions:  No changes  *If you need a refill on your cardiac medications before your next appointment, please call your pharmacy*  Lab Work: No changes  If you have labs (blood work) drawn today and your tests are completely normal, you will receive your results only by: Marland Kitchen MyChart Message (if you have MyChart) OR . A paper copy in the mail If you have any lab test that is abnormal or we need to change your treatment, we will call you to review the results.  Testing/Procedures: None  Follow-Up: At [facility], you and your health needs are our priority.  As part of our continuing mission to provide you with exceptional heart care, we have created designated Provider Care Teams.  These Care Teams include your primary Cardiologist (physician) and Advanced Practice Providers (APPs -  Physician Assistants and Nurse Practitioners) who all work together to provide you with the care you need, when you need it.  Your next appointment:   6 month(s)  The format for your next appointment:   In Person  Provider:    You may see [name], MD or one of the following Advanced Practice Providers on your designated Care Team:    Murray Hodgkins, NP  Christell Faith, PA-C  Marrianne Mood, PA-C

## 2019-08-30 NOTE — Progress Notes (Signed)
Cardiology Office Note   Date:  08/30/2019   ID:  Kayla Farmer, DOB 1966-07-13, MRN [id number]  PCP:  [name], MD  Cardiologist:   [name], MD   Chief Complaint  Patient presents with  . Other    1 month follow from LEA and ECHO. Meds reviewed verbally with patient.       History of Present Illness: Kayla Farmer is a 53 y.o. female who presents for a follow-up visit regarding coronary artery disease.  She has known history of tobacco use, hyperlipidemia, anxiety and family history of coronary artery disease.  She presented in April 2020 with non-ST elevation myocardial infarction.  Cardiac catheterization showed significant two-vessel coronary artery disease with an occluded mid right PDA with left-to-right collaterals.  This was felt to be the culprit but the vessel was small in size and already had collaterals and thus left to be treated medically.  There was significant stenosis in the mid LAD which was treated successfully with PCI and drug-eluting stent placement.  Ejection fraction was 50 to 55%. She quit smoking after her myocardial infarction.  She was most recently seen in our office in November by Gerald Stabs.  She had exertional dyspnea in the setting of decreased physical activity and weight gain.  She was referred for an echocardiogram which showed normal LV systolic function with an EF of 55 to 60% with no significant valvular abnormalities. She also complained of lower extremity claudication and thus lower extremity arterial Doppler was performed which showed normal ABI and toe pressure and normal waveforms. She gained 30 pounds since he quit smoking and as a result she feels more out of breath.  She denies any chest pain.  She takes her medications regularly.  She bruises easily with dual antiplatelet therapy.  Past Medical History:  Diagnosis Date  . Anxiety   . Arthritis   . CAD (coronary artery disease)    a. 12/2018 NSTEMI/PCI: LM nl, LAD 30p, 84m(2.25x22  Resolute Onyx DES), LCX nl, OM2 nl, RCA 464mRPDA 100 w/ L->R collats (likely culprit - med rx). EF 50-55%.  . Claudication ([place]   a. 07/2019 - L>R.  . Depression   . GERD (gastroesophageal reflux disease)   . History of cervical cancer   . History of tobacco abuse    a. 30 pack year - quit 12/2018 @ time of NSTEMI.  . Obesity   . Other inflammations of eyelids   . Positive colorectal cancer screening using Cologuard test 2019  . Sinus infection     Past Surgical History:  Procedure Laterality Date  . CARDIAC CATHETERIZATION    . COLONOSCOPY WITH PROPOFOL N/A 01/24/2018   Procedure: COLONOSCOPY WITH PROPOFOL;  Surgeon: ByRobert BellowMD;  Location: [facility];  Service: Endoscopy;  Laterality: N/A;  . CORONARY STENT INTERVENTION N/A 01/07/2019   Procedure: CORONARY STENT INTERVENTION;  Surgeon: ArWellington HampshireMD;  [facility];  Service: Cardiovascular;  Laterality: N/A;  LAD  . csection x2    . DILATION AND CURETTAGE OF UTERUS    . LEFT HEART CATH AND CORONARY ANGIOGRAPHY N/A 01/07/2019   Procedure: LEFT HEART CATH AND CORONARY ANGIOGRAPHY;  Surgeon: ArWellington HampshireMD;  [facility];  Service: Cardiovascular;  Laterality: N/A;  . LIVER BIOPSY  04/26/2017  . TONSILLECTOMY       Current Outpatient Medications  Medication Sig Dispense Refill  . aspirin EC 81 MG EC tablet Take  1 tablet (81 mg total) by mouth daily. 30 tablet 0  . atorvastatin (LIPITOR) 80 MG tablet Take 1 tablet (80 mg total) by mouth daily at 6 PM. 30 tablet 6  . metoprolol succinate (TOPROL-XL) 25 MG 24 hr tablet Take 1 tablet (25 mg total) by mouth daily. 30 tablet 6  . nitroGLYCERIN (NITROSTAT) 0.4 MG SL tablet Place 1 tablet (0.4 mg total) under the tongue every 5 (five) minutes as needed for chest pain. 30 tablet 0  . omeprazole (PRILOSEC) 40 MG capsule Take 1 capsule (40 mg total) by mouth daily. 90 capsule 1  . sulfamethoxazole-trimethoprim (BACTRIM DS)  800-160 MG tablet Take 1 tablet by mouth 2 (two) times daily. 14 tablet 0  . ticagrelor (BRILINTA) 90 MG TABS tablet Take 1 tablet (90 mg total) by mouth 2 (two) times daily. *PLEASE CALL [phone] TO SCHEDULE APPOINTMENT FOR FURTHER REFILLS.* 60 tablet 6  . venlafaxine XR (EFFEXOR-XR) 75 MG 24 hr capsule TAKE 1 CAPSULE BY MOUTH EVERY DAY 90 capsule 1  . busPIRone (BUSPAR) 30 MG tablet Take 1 tablet (30 mg total) by mouth 3 (three) times daily. As needed for anxiety 90 tablet 2   No current facility-administered medications for this visit.    Allergies:   Codeine    Social History:  The patient  reports that she quit smoking about 7 months ago. Her smoking use included cigarettes. She has a 30.00 pack-year smoking history. She has never used smokeless tobacco. She reports that she does not drink alcohol or use drugs.   Family History:  The patient's family history includes Coronary artery disease in her father and mother.    ROS:  Please see the history of present illness.   Otherwise, review of systems are positive for none.   All other systems are reviewed and negative.    PHYSICAL EXAM: VS:  BP 118/72 (BP Location: Left Arm, Patient Position: Sitting, Cuff Size: Normal)   Pulse (!) 106   Ht 5' 3"  (1.6 m)   Wt 201 lb (91.2 kg)   LMP 10/13/2014 (Approximate)   SpO2 98%   BMI 35.61 kg/m  , BMI Body mass index is 35.61 kg/m. GEN: Well nourished, well developed, in no acute distress  HEENT: normal  Neck: no JVD, carotid bruits, or masses Cardiac: RRR; no murmurs, rubs, or gallops,no edema  Respiratory:  clear to auscultation bilaterally, normal work of breathing GI: soft, nontender, nondistended, + BS MS: no deformity or atrophy  Skin: warm and dry, no rash Neuro:  Strength and sensation are intact Psych: euthymic mood, full affect   EKG:  EKG is not ordered today.    Recent Labs: 11/14/2018: TSH 2.96 02/13/2019: ALT 18; BUN 21; Creatinine, Ser 0.92; Hemoglobin 11.6;  Platelets 223.0; Potassium 4.0; Sodium 140    Lipid Panel    Component Value Date/Time   CHOL 136 02/13/2019 0804   TRIG 99.0 02/13/2019 0804   HDL 50.30 02/13/2019 0804   CHOLHDL 3 02/13/2019 0804   VLDL 19.8 02/13/2019 0804   LDLCALC 66 02/13/2019 0804   LDLDIRECT 157.0 11/10/2016 1028      Wt Readings from Last 3 Encounters:  08/30/19 201 lb (91.2 kg)  08/19/19 196 lb (88.9 kg)  07/25/19 196 lb 4 oz (89 kg)       No flowsheet data found.    ASSESSMENT AND PLAN:  1.  Coronary artery disease involving native coronary arteries without angina: She is doing well overall with no convincing symptoms of  angina.  Recent echocardiogram showed normal LV systolic function and wall motion.  I suspect that her exertional dyspnea and fatigue is likely due to physical deconditioning and progressive weight gain since she quit smoking.  I discussed with her the importance of starting an exercise program and following her heart healthy diet. The plan is to continue dual antiplatelet therapy at least until the end of April 2021.  After that, I recommend stopping ticagrelor and continuing aspirin indefinitely.  2.  Hyperlipidemia: Significant improvement in lipid profile on high-dose atorvastatin with most recent LDL of 66.  Triglyceride was 99.  3.  Atypical leg pain: No evidence of peripheral arterial disease on recent ABI.   Disposition:   FU with me in 6 months  Signed,  [name], MD  08/30/2019 2:01 PM    [facility]

## 2019-08-30 NOTE — Telephone Encounter (Signed)
   [facility] Pre-operative Risk Assessment    Request for surgical clearance:  1. What type of surgery is being performed? Dental procedures    2. When is this surgery scheduled? tbd  3. What type of clearance is required (medical clearance vs. Pharmacy clearance to hold med vs. Both)? both  4. Are there any medications that need to be held prior to surgery and how long?please advise    5. Practice name and name of physician performing surgery? [facility]   6. What is your office phone number [phone]    7.   What is your office fax number [phone]  8.   Anesthesia type (None, local, MAC, general) ? Not noted    Clarisse Gouge 08/30/2019, 12:16 PM  _________________________________________________________________   (provider comments below)

## 2019-09-02 ENCOUNTER — Other Ambulatory Visit: Payer: Self-pay | Admitting: *Deleted

## 2019-09-02 MED ORDER — ATORVASTATIN CALCIUM 80 MG PO TABS: 80.0000 mg | ORAL_TABLET | Freq: Every day | ORAL | 6 refills | Status: DC

## 2019-09-02 NOTE — Telephone Encounter (Signed)
I spoke with the patient. I advised her if her dentist felt she needed an antibiotic post procedure this should be fine, but she did not require anything pre-procedure for SBE prophylaxis. She states her dental office was requesting something in writing. I advised her of recommendations already sent back to her dental office per the pre-op pool.  I have advised her if anything else is needed from her dental office aside from what was already sent, they would need to reach out to [place] directly.  The patient voices understanding and is agreeable.

## 2019-09-02 NOTE — Telephone Encounter (Signed)
PAtient calling in regarding a new dental question. Patient is asking if she will be able to take an antibiotic after having a deep cleaning if needed. Patient has an appointment in the morning

## 2019-10-01 ENCOUNTER — Telehealth: Payer: Self-pay | Admitting: Internal Medicine

## 2019-10-01 MED ORDER — OMEPRAZOLE 40 MG PO CPDR: 40.0000 mg | DELAYED_RELEASE_CAPSULE | Freq: Every day | ORAL | 1 refills | Status: DC

## 2019-10-01 NOTE — Telephone Encounter (Signed)
Medication has been refilled.

## 2019-10-01 NOTE — Addendum Note (Signed)
Addended by: Adair Laundry on: 10/01/2019 01:09 PM   Modules accepted: Orders

## 2019-10-01 NOTE — Telephone Encounter (Signed)
Pt called about needing a refill for omeprazole (PRILOSEC) 40 MG capsule Please advise and Thank you!  Pharmacy is [facility] [place], [place]

## 2019-10-16 ENCOUNTER — Telehealth: Payer: Self-pay

## 2019-10-16 NOTE — Telephone Encounter (Signed)
Copied from [place] [phone]. Topic: Appointment Scheduling - Prior Auth Required for Appointment >> Aug 15, 2019 10:17 AM Kayla Farmer, Helene Kelp D wrote: No appointment has been scheduled. Patient is requesting a work-in or same day appointment for right leg pain with knot. Per scheduling protocol, this appointment requires a prior authorization prior to scheduling.  Route to department's PEC pool.   Patient was seen at a later date by provider about leg pain.  Jejuan Scala,cma

## 2019-11-10 ENCOUNTER — Other Ambulatory Visit: Payer: Self-pay | Admitting: Cardiovascular Disease

## 2019-12-06 ENCOUNTER — Telehealth: Payer: Self-pay | Admitting: Internal Medicine

## 2019-12-06 ENCOUNTER — Other Ambulatory Visit: Payer: Self-pay

## 2019-12-06 MED ORDER — VENLAFAXINE HCL ER 75 MG PO CP24: ORAL_CAPSULE | ORAL | 1 refills | Status: DC

## 2019-12-06 NOTE — Telephone Encounter (Signed)
Pt needs a refill on venlafaxine XR (EFFEXOR-XR) 75 MG 24 hr capsule Pt is out medication

## 2019-12-06 NOTE — Telephone Encounter (Signed)
I called patient to let her know that medication was sent to pharmacy.

## 2020-02-03 ENCOUNTER — Ambulatory Visit: Payer: Medicare Other

## 2020-02-05 ENCOUNTER — Encounter: Payer: Medicare Other | Admitting: Internal Medicine

## 2020-02-13 ENCOUNTER — Other Ambulatory Visit: Payer: Self-pay

## 2020-02-14 ENCOUNTER — Ambulatory Visit: Payer: Medicare Other | Admitting: Cardiovascular Disease

## 2020-02-17 ENCOUNTER — Encounter: Payer: Medicare Other | Admitting: Internal Medicine

## 2020-02-17 ENCOUNTER — Telehealth: Payer: Self-pay

## 2020-02-17 NOTE — Telephone Encounter (Signed)
Pt dropped off a medication list so we could make sure her medications were up to date. Medication list has been updated.

## 2020-03-01 ENCOUNTER — Other Ambulatory Visit: Payer: Self-pay | Admitting: Cardiovascular Disease

## 2020-03-06 ENCOUNTER — Other Ambulatory Visit: Payer: Self-pay

## 2020-03-06 MED ORDER — OMEPRAZOLE 40 MG PO CPDR: 40.0000 mg | DELAYED_RELEASE_CAPSULE | Freq: Every day | ORAL | 1 refills | Status: DC

## 2020-03-12 ENCOUNTER — Other Ambulatory Visit: Payer: Self-pay

## 2020-03-12 ENCOUNTER — Encounter: Payer: Medicare Other | Admitting: Internal Medicine

## 2020-03-12 ENCOUNTER — Ambulatory Visit (INDEPENDENT_AMBULATORY_CARE_PROVIDER_SITE_OTHER): Payer: Medicare Other | Admitting: Cardiovascular Disease

## 2020-03-12 ENCOUNTER — Encounter: Payer: Self-pay | Admitting: Cardiovascular Disease

## 2020-03-12 VITALS — BP 118/70 | HR 81 | Ht 63.0 in | Wt 205.0 lb

## 2020-03-12 DIAGNOSIS — I2510 Atherosclerotic heart disease of native coronary artery without angina pectoris: Secondary | ICD-10-CM

## 2020-03-12 DIAGNOSIS — E785 Hyperlipidemia, unspecified: Secondary | ICD-10-CM | POA: Diagnosis not present

## 2020-03-12 NOTE — Progress Notes (Signed)
Cardiology Office Note   Date:  03/12/2020   ID:  Kayla Farmer, DOB Sep 01, 1966, MRN [id number]  PCP:  Kayla Mc, MD  Cardiologist:   Kayla Sacramento, MD   Chief Complaint  Patient presents with  . Other    6 month follow up. patient c.o Weight gain / SOB. Meds reviewed verbally with patient.       History of Present Illness: Kayla Farmer is a 54 y.o. female who presents for a follow-up visit regarding coronary artery disease.  She has known history of tobacco use, hyperlipidemia, anxiety and family history of coronary artery disease.  She presented in April 2020 with non-ST elevation myocardial infarction.  Cardiac catheterization showed significant two-vessel coronary artery disease with an occluded mid right PDA with left-to-right collaterals.  This was felt to be the culprit but the vessel was small in size and already had collaterals and thus left to be treated medically.  There was significant stenosis in the mid LAD which was treated successfully with PCI and drug-eluting stent placement.  Ejection fraction was 50 to 55%. She quit smoking after her myocardial infarction.  Repeat echocardiogram in December 2020 showed normal LV systolic function with an EF of 55 to 60% with no significant valvular abnormalities. Lower extremity arterial Doppler in December 2020 showed normal ABI and toe pressure and normal waveforms. She gained 20 pounds since her myocardial infarction after she quit smoking.  She denies chest pain.  She does complain of mild exertional dyspnea.  She also complains of easy bruising.   Past Medical History:  Diagnosis Date  . Anxiety   . Arthritis   . CAD (coronary artery disease)    a. 12/2018 NSTEMI/PCI: LM nl, LAD 30p, 14m(2.25x22 Resolute Onyx DES), LCX nl, OM2 nl, RCA 452mRPDA 100 w/ L->R collats (likely culprit - med rx). EF 50-55%.  . [place]   a. 07/2019 - L>R.  . Depression   . GERD (gastroesophageal reflux disease)   . History of  cervical cancer   . History of tobacco abuse    a. 30 pack year - quit 12/2018 @ time of NSTEMI.  . Obesity   . Other inflammations of eyelids   . Positive colorectal cancer screening using Cologuard test 2019  . Sinus infection     Past Surgical History:  Procedure Laterality Date  . CARDIAC CATHETERIZATION    . COLONOSCOPY WITH PROPOFOL N/A 01/24/2018   Procedure: COLONOSCOPY WITH PROPOFOL;  Surgeon: ByRobert BellowMD;  Location: [facility];  Service: Endoscopy;  Laterality: N/A;  . CORONARY STENT INTERVENTION N/A 01/07/2019   Procedure: CORONARY STENT INTERVENTION;  Surgeon: ArWellington HampshireMD;  [place] [facility];  Service: Cardiovascular;  Laterality: N/A;  LAD  . csection x2    . DILATION AND CURETTAGE OF UTERUS    . LEFT HEART CATH AND CORONARY ANGIOGRAPHY N/A 01/07/2019   Procedure: LEFT HEART CATH AND CORONARY ANGIOGRAPHY;  Surgeon: ArWellington HampshireMD;  [place] [facility];  Service: Cardiovascular;  Laterality: N/A;  . LIVER BIOPSY  04/26/2017  . TONSILLECTOMY       Current Outpatient Medications  Medication Sig Dispense Refill  . aspirin EC 81 MG EC tablet Take 1 tablet (81 mg total) by mouth daily. 30 tablet 0  . atorvastatin (LIPITOR) 80 MG tablet TAKE 1 TABLET(80 MG) BY MOUTH DAILY AT 6 PM 30 tablet 0  . busPIRone (BUSPAR) 30 MG tablet Take 1 tablet (  30 mg total) by mouth 3 (three) times daily. As needed for anxiety (Patient taking differently: Take 30 mg by mouth daily. ) 90 tablet 2  . metoprolol succinate (TOPROL-XL) 25 MG 24 hr tablet TAKE 1 TABLET(25 MG) BY MOUTH DAILY 30 tablet 0  . nitroGLYCERIN (NITROSTAT) 0.4 MG SL tablet Place 1 tablet (0.4 mg total) under the tongue every 5 (five) minutes as needed for chest pain. 30 tablet 0  . omeprazole (PRILOSEC) 40 MG capsule Take 1 capsule (40 mg total) by mouth daily. 90 capsule 1  . ticagrelor (BRILINTA) 90 MG TABS tablet Take 1 tablet (90 mg total) by mouth 2 (two) times daily.  *PLEASE CALL [phone] TO SCHEDULE APPOINTMENT FOR FURTHER REFILLS.* 60 tablet 6  . venlafaxine XR (EFFEXOR-XR) 75 MG 24 hr capsule TAKE 1 CAPSULE BY MOUTH EVERY DAY 90 capsule 1   No current facility-administered medications for this visit.    Allergies:   Codeine    Social History:  The patient  reports that she quit smoking about 14 months ago. Her smoking use included cigarettes. She has a 30.00 pack-year smoking history. She has never used smokeless tobacco. She reports that she does not drink alcohol and does not use drugs.   Family History:  The patient's family history includes Coronary artery disease in her father and mother.    ROS:  Please see the history of present illness.   Otherwise, review of systems are positive for none.   All other systems are reviewed and negative.    PHYSICAL EXAM: VS:  BP 118/70 (BP Location: Left Arm, Patient Position: Sitting, Cuff Size: Normal)   Pulse 81   Ht 5' 3"  (1.6 m)   Wt 205 lb (93 kg)   LMP 10/13/2014 (Approximate)   SpO2 98%   BMI 36.31 kg/m  , BMI Body mass index is 36.31 kg/m. GEN: Well nourished, well developed, in no acute distress  HEENT: normal  Neck: no JVD, carotid bruits, or masses Cardiac: RRR; no murmurs, rubs, or gallops,no edema  Respiratory:  clear to auscultation bilaterally, normal work of breathing GI: soft, nontender, nondistended, + BS MS: no deformity or atrophy  Skin: warm and dry, no rash Neuro:  Strength and sensation are intact Psych: euthymic mood, full affect   EKG:  EKG is ordered today. EKG showed normal sinus rhythm with no significant ST or T wave changes.   Recent Labs: No results found for requested labs within last 8760 hours.    Lipid Panel    Component Value Date/Time   CHOL 136 02/13/2019 0804   TRIG 99.0 02/13/2019 0804   HDL 50.30 02/13/2019 0804   CHOLHDL 3 02/13/2019 0804   VLDL 19.8 02/13/2019 0804   LDLCALC 66 02/13/2019 0804   LDLDIRECT 157.0 11/10/2016 1028        Wt Readings from Last 3 Encounters:  03/12/20 205 lb (93 kg)  08/30/19 201 lb (91.2 kg)  08/19/19 196 lb (88.9 kg)       No flowsheet data found.    ASSESSMENT AND PLAN:  1.  Coronary artery disease involving native coronary arteries without angina: She is doing well overall with no symptoms of angina.  I suspect that her exertional dyspnea and fatigue is likely due to physical deconditioning and progressive weight gain since she quit smoking.  I discussed with her the importance of starting an exercise program and following her heart healthy diet. I discontinued Brilinta today.  Continue aspirin indefinitely.  2.  Hyperlipidemia:  Significant improvement in lipid profile on high-dose atorvastatin.  I reviewed most recent lipid profile which showed an LDL of 66 and triglyceride of 99.   3.  Atypical leg pain: No evidence of peripheral arterial disease on recent ABI.   Disposition:   FU with me in 6 months  Signed,  Kayla Sacramento, MD  03/12/2020 9:58 AM    [place]

## 2020-03-12 NOTE — Patient Instructions (Addendum)
Medication Instructions:  Your physician has recommended you make the following change in your medication:   1) STOP Brilinta  2) STOP Nitro-glycerin    *If you need a refill on your cardiac medications before your next appointment, please call your pharmacy*   Lab Work: None ordered If you have labs (blood work) drawn today and your tests are completely normal, you will receive your results only by: Marland Kitchen MyChart Message (if you have MyChart) OR . A paper copy in the mail If you have any lab test that is abnormal or we need to change your treatment, we will call you to review the results.   Testing/Procedures: None ordered   Follow-Up: At [facility], you and your health needs are our priority.  As part of our continuing mission to provide you with exceptional heart care, we have created designated Provider Care Teams.  These Care Teams include your primary Cardiologist (physician) and Advanced Practice Providers (APPs -  Physician Assistants and Nurse Practitioners) who all work together to provide you with the care you need, when you need it.  We recommend signing up for the patient portal called "MyChart".  Sign up information is provided on this After Visit Summary.  MyChart is used to connect with patients for Virtual Visits (Telemedicine).  Patients are able to view lab/test results, encounter notes, upcoming appointments, etc.  Non-urgent messages can be sent to your provider as well.   To learn more about what you can do with MyChart, go to [email or web address].    Your next appointment:   6 month(s)  The format for your next appointment:   In Person  Provider:    You may see [name], MD or one of the following Advanced Practice Providers on your designated Care Team:    Murray Hodgkins, NP  Christell Faith, PA-C  Marrianne Mood, PA-C    Other Instructions N/A

## 2020-03-19 ENCOUNTER — Encounter: Payer: Self-pay | Admitting: Internal Medicine

## 2020-03-19 ENCOUNTER — Other Ambulatory Visit: Payer: Self-pay

## 2020-03-19 ENCOUNTER — Ambulatory Visit (INDEPENDENT_AMBULATORY_CARE_PROVIDER_SITE_OTHER): Payer: Medicare Other | Admitting: Internal Medicine

## 2020-03-19 VITALS — BP 116/78 | HR 80 | Temp 98.1°F | Resp 16 | Ht 63.0 in | Wt 205.6 lb

## 2020-03-19 DIAGNOSIS — R635 Abnormal weight gain: Secondary | ICD-10-CM | POA: Diagnosis not present

## 2020-03-19 DIAGNOSIS — Z0000 Encounter for general adult medical examination without abnormal findings: Secondary | ICD-10-CM | POA: Diagnosis not present

## 2020-03-19 DIAGNOSIS — E1139 Type 2 diabetes mellitus with other diabetic ophthalmic complication: Secondary | ICD-10-CM

## 2020-03-19 DIAGNOSIS — Z79899 Other long term (current) drug therapy: Secondary | ICD-10-CM

## 2020-03-19 DIAGNOSIS — Z1231 Encounter for screening mammogram for malignant neoplasm of breast: Secondary | ICD-10-CM

## 2020-03-19 DIAGNOSIS — E669 Obesity, unspecified: Secondary | ICD-10-CM

## 2020-03-19 DIAGNOSIS — R7303 Prediabetes: Secondary | ICD-10-CM

## 2020-03-19 DIAGNOSIS — K760 Fatty (change of) liver, not elsewhere classified: Secondary | ICD-10-CM

## 2020-03-19 LAB — LIPID PANEL
Cholesterol: 145 mg/dL (ref 0–200)
HDL: 45.1 mg/dL (ref >39.00)
LDL Cholesterol: 70 mg/dL (ref 0–99)
NonHDL: 100.32
Total CHOL/HDL Ratio: 3
Triglycerides: 153 mg/dL — ABNORMAL HIGH (ref 0.0–149.0)
VLDL: 30.6 mg/dL (ref 0.0–40.0)

## 2020-03-19 LAB — COMPREHENSIVE METABOLIC PANEL
ALT: 18 U/L (ref 0–35)
AST: 19 U/L (ref 0–37)
Albumin: 4 g/dL (ref 3.5–5.2)
Alkaline Phosphatase: 188 U/L — ABNORMAL HIGH (ref 39–117)
BUN: 20 mg/dL (ref 6–23)
CO2: 26 mEq/L (ref 19–32)
Calcium: 9.1 mg/dL (ref 8.4–10.5)
Chloride: 103 mEq/L (ref 96–112)
Creatinine, Ser: 0.91 mg/dL (ref 0.40–1.20)
GFR: 63.9 mL/min (ref >60.00)
Glucose, Bld: 100 mg/dL — ABNORMAL HIGH (ref 70–99)
Potassium: 4.1 mEq/L (ref 3.5–5.1)
Sodium: 138 mEq/L (ref 135–145)
Total Bilirubin: 0.5 mg/dL (ref 0.2–1.2)
Total Protein: 6.6 g/dL (ref 6.0–8.3)

## 2020-03-19 LAB — CBC WITH DIFFERENTIAL/PLATELET
Basophils Absolute: 0.1 10*3/uL (ref 0.0–0.1)
Basophils Relative: 0.9 % (ref 0.0–3.0)
Eosinophils Absolute: 0.2 10*3/uL (ref 0.0–0.7)
Eosinophils Relative: 2.5 % (ref 0.0–5.0)
HCT: 33.5 % — ABNORMAL LOW (ref 36.0–46.0)
Hemoglobin: 10.9 g/dL — ABNORMAL LOW (ref 12.0–15.0)
Lymphocytes Relative: 30.1 % (ref 12.0–46.0)
Lymphs Abs: 2.3 10*3/uL (ref 0.7–4.0)
MCHC: 32.6 g/dL (ref 30.0–36.0)
MCV: 80.2 fl (ref 78.0–100.0)
Monocytes Absolute: 0.4 10*3/uL (ref 0.1–1.0)
Monocytes Relative: 5.3 % (ref 3.0–12.0)
Neutro Abs: 4.6 10*3/uL (ref 1.4–7.7)
Neutrophils Relative %: 61.2 % (ref 43.0–77.0)
Platelets: 260 10*3/uL (ref 150.0–400.0)
RBC: 4.18 Mil/uL (ref 3.87–5.11)
RDW: 15.5 % (ref 11.5–15.5)
WBC: 7.6 10*3/uL (ref 4.0–10.5)

## 2020-03-19 LAB — TSH: TSH: 1.93 u[IU]/mL (ref 0.35–4.50)

## 2020-03-19 LAB — MICROALBUMIN / CREATININE URINE RATIO
Creatinine,U: 168.7 mg/dL
Microalb Creat Ratio: 0.9 mg/g (ref 0.0–30.0)
Microalb, Ur: 1.6 mg/dL (ref 0.0–1.9)

## 2020-03-19 LAB — HEMOGLOBIN A1C: Hgb A1c MFr Bld: 6.5 % (ref 4.6–6.5)

## 2020-03-19 NOTE — Patient Instructions (Addendum)
I encourage you to start a 30 minute BRISK walking program daily.  THIS IS CRUCIAL FOR YOUR HEART, YOUR WEIGHT AND YOUR DIABETES RISK   KEEP A FOOD DIARY FOR 3 DAYS and send it to me  LESS SUGARS,  MORE PROTEINS!     Your annual mammogram has been ordered.  You are encouraged (required) to call to make your appointment at [facility]    Here are several low carb protein bars that make great snacks:   Power Crunch  KIND 5 g sugar  Quest  Atkins   You might want to try a premixed protein drink for breakfast.  There are at least 4 that are [employer] Protein Atkins Muscle Milk Advantage     great tasting,   very low sugar and available of < $2 serving at [facility] and  In bulk for $1.50/serving at [employer] and [employer]  .    Nutritional analysis :  160 cal  30 g protein  1 g sugar 50% calcium needs   Vladimir Faster and [employer]    Prediabetes Eating Plan Prediabetes is a condition that causes blood sugar (glucose) levels to be higher than normal. This increases the risk for developing diabetes. In order to prevent diabetes from developing, your health care provider may recommend a diet and other lifestyle changes to help you:  Control your blood glucose levels.  Improve your cholesterol levels.  Manage your blood pressure. Your health care provider may recommend working with a diet and nutrition specialist (dietitian) to make a meal plan that is best for you. What are tips for following this plan? Lifestyle  Set weight loss goals with the help of your health care team. It is recommended that most people with prediabetes lose 7% of their current body weight.  Exercise for at least 30 minutes at least 5 days a week.  Attend a support group or seek ongoing support from a mental health counselor.  Take over-the-counter and prescription medicines only as told by your health care provider. Reading food labels  Read food labels to check the amount of fat, salt  (sodium), and sugar in prepackaged foods. Avoid foods that have: ? Saturated fats. ? Trans fats. ? Added sugars.  Avoid foods that have more than 300 milligrams (mg) of sodium per serving. Limit your daily sodium intake to less than 2,300 mg each day. Shopping  Avoid buying pre-made and processed foods. Cooking  Cook with olive oil. Do not use butter, lard, or ghee.  Bake, broil, grill, or boil foods. Avoid frying. Meal planning   Work with your dietitian to develop an eating plan that is right for you. This may include: ? Tracking how many calories you take in. Use a food diary, notebook, or mobile application to track what you eat at each meal. ? Using the glycemic index (GI) to plan your meals. The index tells you how quickly a food will raise your blood glucose. Choose low-GI foods. These foods take a longer time to raise blood glucose.  Consider following a Mediterranean diet. This diet includes: ? Several servings each day of fresh fruits and vegetables. ? Eating fish at least twice a week. ? Several servings each day of whole grains, beans, nuts, and seeds. ? Using olive oil instead of other fats. ? Moderate alcohol consumption. ? Eating small amounts of red meat and whole-fat dairy.  If you have high blood pressure, you may need to limit your  sodium intake or follow a diet such as the DASH eating plan. DASH is an eating plan that aims to lower high blood pressure. What foods are recommended? The items listed below may not be a complete list. Talk with your dietitian about what dietary choices are best for you. Grains Whole grains, such as whole-wheat or whole-grain breads, crackers, cereals, and pasta. Unsweetened oatmeal. Bulgur. Barley. Quinoa. Brown rice. Corn or whole-wheat flour tortillas or taco shells. Vegetables Lettuce. Spinach. Peas. Beets. Cauliflower. Cabbage. Broccoli. Carrots. Tomatoes. Squash. Eggplant. Herbs. Peppers. Onions. Cucumbers. Brussels  sprouts. Fruits Berries. Bananas. Apples. Oranges. Grapes. Papaya. Mango. Pomegranate. Kiwi. Grapefruit. Cherries. Meats and other protein foods Seafood. Poultry without skin. Lean cuts of pork and beef. Tofu. Eggs. Nuts. Beans. Dairy Low-fat or fat-free dairy products, such as yogurt, cottage cheese, and cheese. Beverages Water. Tea. Coffee. Sugar-free or diet soda. Seltzer water. Lowfat or no-fat milk. Milk alternatives, such as soy or almond milk. Fats and oils Olive oil. Canola oil. Sunflower oil. Grapeseed oil. Avocado. Walnuts. Sweets and desserts Sugar-free or low-fat pudding. Sugar-free or low-fat ice cream and other frozen treats. Seasoning and other foods Herbs. Sodium-free spices. Mustard. Relish. Low-fat, low-sugar ketchup. Low-fat, low-sugar barbecue sauce. Low-fat or fat-free mayonnaise. What foods are not recommended? The items listed below may not be a complete list. Talk with your dietitian about what dietary choices are best for you. Grains Refined white flour and flour products, such as bread, pasta, snack foods, and cereals. Vegetables Canned vegetables. Frozen vegetables with butter or cream sauce. Fruits Fruits canned with syrup. Meats and other protein foods Fatty cuts of meat. Poultry with skin. Breaded or fried meat. Processed meats. Dairy Full-fat yogurt, cheese, or milk. Beverages Sweetened drinks, such as sweet iced tea and soda. Fats and oils Butter. Lard. Ghee. Sweets and desserts Baked goods, such as cake, cupcakes, pastries, cookies, and cheesecake. Seasoning and other foods Spice mixes with added salt. Ketchup. Barbecue sauce. Mayonnaise. Summary  To prevent diabetes from developing, you may need to make diet and other lifestyle changes to help control blood sugar, improve cholesterol levels, and manage your blood pressure.  Set weight loss goals with the help of your health care team. It is recommended that most people with prediabetes lose 7  percent of their current body weight.  Consider following a Mediterranean diet that includes plenty of fresh fruits and vegetables, whole grains, beans, nuts, seeds, fish, lean meat, low-fat dairy, and healthy oils. This information is not intended to replace advice given to you by your health care provider. Make sure you discuss any questions you have with your health care provider. Document Revised: 12/21/2018 Document Reviewed: 11/02/2016 Elsevier Patient Education  2020 [place] Maintenance for Postmenopausal Women Menopause is a normal process in which your ability to get pregnant comes to an end. This process happens slowly over many months or years, usually between the ages of 21 and 73. Menopause is complete when you have missed your menstrual periods for 12 months. It is important to talk with your health care provider about some of the most common conditions that affect women after menopause (postmenopausal women). These include heart disease, cancer, and bone loss (osteoporosis). Adopting a healthy lifestyle and getting preventive care can help to promote your health and wellness. The actions you take can also lower your chances of developing some of these common conditions. What should I know about menopause? During menopause, you may get a number of symptoms, such as:  Hot flashes. These can be moderate or severe.  Night sweats.  Decrease in sex drive.  Mood swings.  Headaches.  Tiredness.  Irritability.  Memory problems.  Insomnia. Choosing to treat or not to treat these symptoms is a decision that you make with your health care provider. Do I need hormone replacement therapy?  Hormone replacement therapy is effective in treating symptoms that are caused by menopause, such as hot flashes and night sweats.  Hormone replacement carries certain risks, especially as you become older. If you are thinking about using estrogen or estrogen with progestin,  discuss the benefits and risks with your health care provider. What is my risk for heart disease and stroke? The risk of heart disease, heart attack, and stroke increases as you age. One of the causes may be a change in the body's hormones during menopause. This can affect how your body uses dietary fats, triglycerides, and cholesterol. Heart attack and stroke are medical emergencies. There are many things that you can do to help prevent heart disease and stroke. Watch your blood pressure  High blood pressure causes heart disease and increases the risk of stroke. This is more likely to develop in people who have high blood pressure readings, are of African descent, or are overweight.  Have your blood pressure checked: ? Every 3-5 years if you are 70-22 years of age. ? Every year if you are 76 years old or older. Eat a healthy diet   Eat a diet that includes plenty of vegetables, fruits, low-fat dairy products, and lean protein.  Do not eat a lot of foods that are high in solid fats, added sugars, or sodium. Get regular exercise Get regular exercise. This is one of the most important things you can do for your health. Most adults should:  Try to exercise for at least 150 minutes each week. The exercise should increase your heart rate and make you sweat (moderate-intensity exercise).  Try to do strengthening exercises at least twice each week. Do these in addition to the moderate-intensity exercise.  Spend less time sitting. Even light physical activity can be beneficial. Other tips  Work with your health care provider to achieve or maintain a healthy weight.  Do not use any products that contain nicotine or tobacco, such as cigarettes, e-cigarettes, and chewing tobacco. If you need help quitting, ask your health care provider.  Know your numbers. Ask your health care provider to check your cholesterol and your blood sugar (glucose). Continue to have your blood tested as directed by your  health care provider. Do I need screening for cancer? Depending on your health history and family history, you may need to have cancer screening at different stages of your life. This may include screening for:  Breast cancer.  Cervical cancer.  Lung cancer.  Colorectal cancer. What is my risk for osteoporosis? After menopause, you may be at increased risk for osteoporosis. Osteoporosis is a condition in which bone destruction happens more quickly than new bone creation. To help prevent osteoporosis or the bone fractures that can happen because of osteoporosis, you may take the following actions:  If you are 45-37 years old, get at least 1,000 mg of calcium and at least 600 mg of vitamin D per day.  If you are older than age 2 but younger than age 54, get at least 1,200 mg of calcium and at least 600 mg of vitamin D per day.  If you are older than age 90, get at least 56,200  mg of calcium and at least 800 mg of vitamin D per day. Smoking and drinking excessive alcohol increase the risk of osteoporosis. Eat foods that are rich in calcium and vitamin D, and do weight-bearing exercises several times each week as directed by your health care provider. How does menopause affect my mental health? Depression may occur at any age, but it is more common as you become older. Common symptoms of depression include:  Low or sad mood.  Changes in sleep patterns.  Changes in appetite or eating patterns.  Feeling an overall lack of motivation or enjoyment of activities that you previously enjoyed.  Frequent crying spells. Talk with your health care provider if you think that you are experiencing depression. General instructions See your health care provider for regular wellness exams and vaccines. This may include:  Scheduling regular health, dental, and eye exams.  Getting and maintaining your vaccines. These include: ? Influenza vaccine. Get this vaccine each year before the flu season  begins. ? Pneumonia vaccine. ? Shingles vaccine. ? Tetanus, diphtheria, and pertussis (Tdap) booster vaccine. Your health care provider may also recommend other immunizations. Tell your health care provider if you have ever been abused or do not feel safe at home. Summary  Menopause is a normal process in which your ability to get pregnant comes to an end.  This condition causes hot flashes, night sweats, decreased interest in sex, mood swings, headaches, or lack of sleep.  Treatment for this condition may include hormone replacement therapy.  Take actions to keep yourself healthy, including exercising regularly, eating a healthy diet, watching your weight, and checking your blood pressure and blood sugar levels.  Get screened for cancer and depression. Make sure that you are up to date with all your vaccines. This information is not intended to replace advice given to you by your health care provider. Make sure you discuss any questions you have with your health care provider. Document Revised: 08/22/2018 Document Reviewed: 08/22/2018 Elsevier Patient Education  2020 [employer].

## 2020-03-19 NOTE — Progress Notes (Signed)
Patient ID: Kayla Farmer, female    DOB: 03/22/1966  Age: 54 y.o. MRN: [id number]  The patient is here for annual wellness examination and management of other chronic and acute problems.  This visit occurred during the SARS-CoV-2 public health emergency.  Safety protocols were in place, including screening questions prior to the visit, additional usage of staff PPE, and extensive cleaning of exam room while observing appropriate contact time as indicated for disinfecting solutions.   Patient is not vaccinated and was NOT wearing a mask when I entered the room     The risk factors are reflected in the social history.  The roster of all physicians providing medical care to patient - is listed in the Snapshot section of the chart.  Activities of daily living:  The patient is 100% independent in all ADLs: dressing, toileting, feeding as well as independent mobility  Home safety : The patient has smoke detectors in the home. They wear seatbelts.  There are no firearms at home. There is no violence in the home.   There is no risks for hepatitis, STDs or HIV. There is no   history of blood transfusion. They have no travel history to infectious disease endemic areas of the world.  The patient has seen their dentist in the last six month. They have seen their eye doctor in the last year. They admit to slight hearing difficulty with regard to whispered voices and some television programs.  They have deferred audiologic testing in the last year.  They do not  have excessive sun exposure. Discussed the need for sun protection: hats, long sleeves and use of sunscreen if there is significant sun exposure.   Diet: the importance of a healthy diet is discussed. They do have a healthy diet.  The benefits of regular aerobic exercise were discussed. She walks 4 times per week ,  20 minutes.   Depression screen: there are no signs or vegative symptoms of depression- irritability, change in appetite, anhedonia,  sadness/tearfullness.  Cognitive assessment: the patient manages all their financial and personal affairs and is actively engaged. They could relate day,date,year and events; recalled 2/3 objects at 3 minutes; performed clock-face test normally.  The following portions of the patient's history were reviewed and updated as appropriate: allergies, current medications, past family history, past medical history,  past surgical history, past social history  and problem list.  Visual acuity was not assessed per patient preference since she has regular follow up with her ophthalmologist. Hearing and body mass index were assessed and reviewed.   During the course of the visit the patient was educated and counseled about appropriate screening and preventive services including : fall prevention , diabetes screening, nutrition counseling, colorectal cancer screening, and recommended immunizations.    CC: The primary encounter diagnosis was Breast cancer screening by mammogram. Diagnoses of Abnormal weight gain, Prediabetes, Long-term use of high-risk medication, Encounter for screening mammogram for malignant neoplasm of breast, Type 2 diabetes mellitus with other ophthalmic complication, without long-term current use of insulin ([place]), Encounter for preventive health examination, Hepatic steatosis, and Obesity (BMI 30.0-34.9) were also pertinent to this visit.  1) not exercising. Fatigue.morning stiffness . "I don't eat much>'  At most twice day  Skips breakfast.  Lunch is a bowl of cereal.Frosted Flakes,  Or other sugar coated cereals.  Averages 4 slices of bread daily..  No potatoes "in a while."  Eating rice /Chinese food once a week.    History Kayla Farmer has a past medical  history of Anxiety, Arthritis, CAD (coronary artery disease), Claudication ([place]), Depression, GERD (gastroesophageal reflux disease), History of cervical cancer, History of tobacco abuse, Obesity, Other inflammations of eyelids, Positive  colorectal cancer screening using Cologuard test (2019), and Sinus infection.   She has a past surgical history that includes Liver biopsy (04/26/2017); Tonsillectomy; csection x2; Colonoscopy with propofol (N/A, 01/24/2018); Dilation and curettage of uterus; LEFT HEART CATH AND CORONARY ANGIOGRAPHY (N/A, 01/07/2019); CORONARY STENT INTERVENTION (N/A, 01/07/2019); and Cardiac catheterization.   Her family history includes Coronary artery disease in her father and mother.She reports that she quit smoking about 14 months ago. Her smoking use included cigarettes. She has a 30.00 pack-year smoking history. She has never used smokeless tobacco. She reports that she does not drink alcohol and does not use drugs.  Outpatient Medications Prior to Visit  Medication Sig Dispense Refill  . aspirin EC 81 MG EC tablet Take 1 tablet (81 mg total) by mouth daily. 30 tablet 0  . atorvastatin (LIPITOR) 80 MG tablet TAKE 1 TABLET(80 MG) BY MOUTH DAILY AT 6 PM 30 tablet 0  . metoprolol succinate (TOPROL-XL) 25 MG 24 hr tablet TAKE 1 TABLET(25 MG) BY MOUTH DAILY 30 tablet 0  . omeprazole (PRILOSEC) 40 MG capsule Take 1 capsule (40 mg total) by mouth daily. 90 capsule 1  . venlafaxine XR (EFFEXOR-XR) 75 MG 24 hr capsule TAKE 1 CAPSULE BY MOUTH EVERY DAY 90 capsule 1  . busPIRone (BUSPAR) 30 MG tablet Take 1 tablet (30 mg total) by mouth 3 (three) times daily. As needed for anxiety (Patient taking differently: Take 30 mg by mouth daily. ) 90 tablet 2  . nitroGLYCERIN (NITROSTAT) 0.4 MG SL tablet Place 1 tablet (0.4 mg total) under the tongue every 5 (five) minutes as needed for chest pain. (Patient not taking: Reported on 03/19/2020) 30 tablet 0   No facility-administered medications prior to visit.    Review of Systems   Patient denies headache, fevers, malaise, unintentional weight loss, skin rash, eye pain, sinus congestion and sinus pain, sore throat, dysphagia,  hemoptysis , cough, dyspnea, wheezing, chest pain,  palpitations, orthopnea, edema, abdominal pain, nausea, melena, diarrhea, constipation, flank pain, dysuria, hematuria, urinary  Frequency, nocturia, numbness, tingling, seizures,  Focal weakness, Loss of consciousness,  Tremor, insomnia, depression, anxiety, and suicidal ideation.      Objective:  BP 116/78 (BP Location: Left Arm, Patient Position: Sitting, Cuff Size: Large)   Pulse 80   Temp 98.1 F (36.7 C) (Temporal)   Resp 16   Ht 5' 3"  (1.6 m)   Wt 205 lb 9.6 oz (93.3 kg)   LMP 10/13/2014 (Approximate)   SpO2 95%   BMI 36.42 kg/m   Physical Exam  General appearance: alert, cooperative and appears stated age Head: Normocephalic, without obvious abnormality, atraumatic Eyes: conjunctivae/corneas clear. PERRL, EOM's intact. Fundi benign. Ears: normal TM's and external ear canals both ears Nose: Nares normal. Septum midline. Mucosa normal. No drainage or sinus tenderness. Throat: lips, mucosa, and tongue normal; teeth and gums normal Neck: no adenopathy, no carotid bruit, no JVD, supple, symmetrical, trachea midline and thyroid not enlarged, symmetric, no tenderness/mass/nodules Lungs: clear to auscultation bilaterally Breasts: normal appearance, no masses or tenderness Heart: regular rate and rhythm, S1, S2 normal, no murmur, click, rub or gallop Abdomen: soft, non-tender; bowel sounds normal; no masses,  no organomegaly Extremities: extremities normal, atraumatic, no cyanosis or edema Pulses: 2+ and symmetric Skin: Skin color, texture, turgor normal. No rashes or lesions Neurologic: Alert and  oriented X 3, normal strength and tone. Normal symmetric reflexes. Normal coordination and gait.     Assessment & Plan:   Problem List Items Addressed This Visit      Unprioritized   Screening for breast cancer    Mammogram ordered       Hepatic steatosis    She is no longer  losing  weight or motivated to exercise since her NSTEMI.   She is tolerating the statin .  Reassured  her that statin therapy is part of the treatment for fatty liver (along with weight loss and low glycemic index diet)  Lab Results  Component Value Date   ALT 18 03/19/2020   AST 19 03/19/2020   ALKPHOS 188 (H) 03/19/2020   BILITOT 0.5 03/19/2020         Encounter for preventive health examination    age appropriate education and counseling updated, referrals for preventative services and immunizations addressed, dietary and smoking counseling addressed, most recent labs reviewed.  I have personally reviewed and have noted:  1) the patient's medical and social history 2) The pt's use of alcohol, tobacco, and illicit drugs 3) The patient's current medications and supplements 4) Functional ability including ADL's, fall risk, home safety risk, hearing and visual impairment 5) Diet and physical activities 6) Evidence for depression or mood disorder 7) The patient's height, weight, and BMI have been recorded in the chart  I have made referrals, and provided counseling and education based on review of the above      Type 2 diabetes mellitus with ophthalmic complication ([place])    New diagnosis.  Will need to start metformin.  Continue atorvastatin and aspirin   Lab Results  Component Value Date   HGBA1C 6.5 03/19/2020    Lab Results  Component Value Date   MICROALBUR 1.6 03/19/2020           Obesity (BMI 30.0-34.9)    I have addressed  BMI and recommended a low glycemic index diet utilizing smaller more frequent meals to increase metabolism.  I have also recommended that patient start exercising with a goal of 30 minutes of aerobic exercise a minimum of 5 days per week. Screening for lipid disorders, thyroid and diabetes to be done today.         Other Visit Diagnoses    Breast cancer screening by mammogram    -  Primary   Relevant Orders   MM 3D SCREEN BREAST BILATERAL   Abnormal weight gain       Relevant Orders   Comprehensive metabolic panel (Completed)   TSH  (Completed)   Long-term use of high-risk medication       Relevant Orders   CBC with Differential/Platelet (Completed)      I am having Wardell Heath maintain her aspirin, nitroGLYCERIN, busPIRone, venlafaxine XR, metoprolol succinate, atorvastatin, and omeprazole.  No orders of the defined types were placed in this encounter.   There are no discontinued medications.  Follow-up: Return in about 6 months (around 09/19/2020).   [name], MD

## 2020-03-21 NOTE — Assessment & Plan Note (Signed)
Mammogram ordered

## 2020-03-22 NOTE — Assessment & Plan Note (Signed)

## 2020-03-22 NOTE — Assessment & Plan Note (Signed)
New diagnosis.  Will need to start metformin.  Continue atorvastatin and aspirin   Lab Results  Component Value Date   HGBA1C 6.5 03/19/2020    Lab Results  Component Value Date   MICROALBUR 1.6 03/19/2020

## 2020-03-22 NOTE — Assessment & Plan Note (Signed)
I have addressed  BMI and recommended a low glycemic index diet utilizing smaller more frequent meals to increase metabolism.  I have also recommended that patient start exercising with a goal of 30 minutes of aerobic exercise a minimum of 5 days per week. Screening for lipid disorders, thyroid and diabetes to be done today.   

## 2020-03-22 NOTE — Assessment & Plan Note (Signed)
She is no longer  losing  weight or motivated to exercise since her NSTEMI.   She is tolerating the statin .  Reassured her that statin therapy is part of the treatment for fatty liver (along with weight loss and low glycemic index diet)  Lab Results  Component Value Date   ALT 18 03/19/2020   AST 19 03/19/2020   ALKPHOS 188 (H) 03/19/2020   BILITOT 0.5 03/19/2020

## 2020-04-01 ENCOUNTER — Other Ambulatory Visit: Payer: Self-pay | Admitting: Cardiovascular Disease

## 2020-04-06 ENCOUNTER — Other Ambulatory Visit: Payer: Self-pay

## 2020-04-06 ENCOUNTER — Encounter: Payer: Self-pay | Admitting: Internal Medicine

## 2020-04-06 ENCOUNTER — Ambulatory Visit (INDEPENDENT_AMBULATORY_CARE_PROVIDER_SITE_OTHER): Payer: Medicare Other | Admitting: Internal Medicine

## 2020-04-06 VITALS — BP 120/74 | HR 76 | Temp 98.1°F | Resp 15 | Ht 63.0 in | Wt 202.0 lb

## 2020-04-06 DIAGNOSIS — E1169 Type 2 diabetes mellitus with other specified complication: Secondary | ICD-10-CM

## 2020-04-06 DIAGNOSIS — E669 Obesity, unspecified: Secondary | ICD-10-CM

## 2020-04-06 DIAGNOSIS — E119 Type 2 diabetes mellitus without complications: Secondary | ICD-10-CM | POA: Diagnosis not present

## 2020-04-06 DIAGNOSIS — B351 Tinea unguium: Secondary | ICD-10-CM | POA: Diagnosis not present

## 2020-04-06 DIAGNOSIS — D649 Anemia, unspecified: Secondary | ICD-10-CM

## 2020-04-06 LAB — IBC + FERRITIN
Ferritin: 21.3 ng/mL (ref 10.0–291.0)
Iron: 46 ug/dL (ref 42–145)
Saturation Ratios: 12.8 % — ABNORMAL LOW (ref 20.0–50.0)
Transferrin: 256 mg/dL (ref 212.0–360.0)

## 2020-04-06 LAB — VITAMIN B12: Vitamin B-12: 615 pg/mL (ref 211–911)

## 2020-04-06 NOTE — Patient Instructions (Addendum)
Work up for anemia is underway.    Since you are losing weight,  Keep doing what you are doing!  Here are a few pointers   Limit your servings of bread (cereal, biscuits, too ) to 2 ,  Not 4 servings   Keep walking and mowing for exercise!  For ice cream,  You can have a CarbSmart ([employer])  Bar every day,  Or Andy's custard  Not more than twice a week  Avoid breaded meats   There are low carb breakfast and lunch entrees that are microwaveable :  Danton Clap Frittatas and Eggwhich  Healthy Choice low carb Power bowls Life Cuisine Cauliflower Pizza Bowl   Cereal , grits and oatmeal are all high in carbohydrates  So avoid daily eating them   Low glycemic index food list attached

## 2020-04-06 NOTE — Assessment & Plan Note (Addendum)
I have addressed  BMI and recommended a low glycemic index diet utilizing smaller more frequent meals to increase metabolism.  I have also recommended that patient start exercising with a goal of 30 minutes of aerobic exercise a minimum of 5 days per week.  

## 2020-04-06 NOTE — Assessment & Plan Note (Signed)
New diagnosis.  She prefers to defer use of  Metformin since she is losing weight with dietary changes . Marland Kitchen  Continue atorvastatin and aspirin   Lab Results  Component Value Date   HGBA1C 6.5 03/19/2020    Lab Results  Component Value Date   MICROALBUR 1.6 03/19/2020

## 2020-04-06 NOTE — Progress Notes (Addendum)
Subjective:  Patient ID: Kayla Farmer, female    DOB: Aug 20, 1966  Age: 55 y.o. MRN: [id number]  CC: The primary encounter diagnosis was Anemia, unspecified type. Diagnoses of Type 2 diabetes mellitus with obesity ([place]), Obesity (BMI 30.0-34.9), and Onychomycosis of toenail were also pertinent to this visit.  HPI Kayla Farmer presents for follow up on abnormal labs indicating Type 2 DM diagnosis.  Patient was recently noted to have an a1c of 6.5 . Fasting glucoses have been elevated but not diagnostic  Lab Results  Component Value Date   HGBA1C 6.5 03/19/2020    Since July 8 notification she has made dietary changes.  Already a  Very picky eater.  Doesn't eat vegetables on aregular basis. Dinner is a Pharmacist, community salad with thousand island dressing .  Has given up sodas . Breakfast is  Low carb biscuit from [employer].  Skips some mornings; has lost 5 lbs by home scales since July 8. Lunch : a couple crackers [employer]itz,  Plain or with peanut butter  3rd toenail on right foot has become opaque .  She has already had her great toenail on same foot removed by a podiatrist several years ago.  Anemia:  Asymptomatic.  No  BRPBR or melanotic stools . Menopausal.     Outpatient Medications Prior to Visit  Medication Sig Dispense Refill  . aspirin EC 81 MG EC tablet Take 1 tablet (81 mg total) by mouth daily. 30 tablet 0  . atorvastatin (LIPITOR) 80 MG tablet TAKE 1 TABLET(80 MG) BY MOUTH DAILY AT 6 PM 30 tablet 0  . metoprolol succinate (TOPROL-XL) 25 MG 24 hr tablet TAKE 1 TABLET(25 MG) BY MOUTH DAILY 30 tablet 0  . omeprazole (PRILOSEC) 40 MG capsule Take 1 capsule (40 mg total) by mouth daily. 90 capsule 1  . venlafaxine XR (EFFEXOR-XR) 75 MG 24 hr capsule TAKE 1 CAPSULE BY MOUTH EVERY DAY 90 capsule 1  . busPIRone (BUSPAR) 30 MG tablet Take 1 tablet (30 mg total) by mouth 3 (three) times daily. As needed for anxiety (Patient taking differently: Take 30 mg by mouth daily. ) 90 tablet 2  .  nitroGLYCERIN (NITROSTAT) 0.4 MG SL tablet Place 1 tablet (0.4 mg total) under the tongue every 5 (five) minutes as needed for chest pain. (Patient not taking: Reported on 03/19/2020) 30 tablet 0   No facility-administered medications prior to visit.    Review of Systems;  Patient denies headache, fevers, malaise, unintentional weight loss, skin rash, eye pain, sinus congestion and sinus pain, sore throat, dysphagia,  hemoptysis , cough, dyspnea, wheezing, chest pain, palpitations, orthopnea, edema, abdominal pain, nausea, melena, diarrhea, constipation, flank pain, dysuria, hematuria, urinary  Frequency, nocturia, numbness, tingling, seizures,  Focal weakness, Loss of consciousness,  Tremor, insomnia, depression, anxiety, and suicidal ideation.      Objective:  BP 120/74 (BP Location: Left Arm, Patient Position: Sitting, Cuff Size: Large)   Pulse 76   Temp 98.1 F (36.7 C) (Oral)   Resp 15   Ht 5' 3"  (1.6 m)   Wt 202 lb (91.6 kg)   LMP 10/13/2014 (Approximate)   SpO2 97%   BMI 35.78 kg/m   BP Readings from Last 3 Encounters:  04/06/20 120/74  03/19/20 116/78  03/12/20 118/70    Wt Readings from Last 3 Encounters:  04/06/20 202 lb (91.6 kg)  03/19/20 205 lb 9.6 oz (93.3 kg)  03/12/20 205 lb (93 kg)    General appearance: alert, cooperative and appears stated age Ears:  normal TM's and external ear canals both ears Throat: lips, mucosa, and tongue normal; teeth and gums normal Neck: no adenopathy, no carotid bruit, supple, symmetrical, trachea midline and thyroid not enlarged, symmetric, no tenderness/mass/nodules Back: symmetric, no curvature. ROM normal. No CVA tenderness. Lungs: clear to auscultation bilaterally Heart: regular rate and rhythm, S1, S2 normal, no murmur, click, rub or gallop Abdomen: soft, non-tender; bowel sounds normal; no masses,  no organomegaly Pulses: 2+ and symmetric Skin: Skin color, texture, turgor normal. No rashes or lesions Lymph nodes:  Cervical, supraclavicular, and axillary nodes normal.  Lab Results  Component Value Date   HGBA1C 6.5 03/19/2020   HGBA1C 6.3 02/13/2019   HGBA1C 5.9 11/14/2018    Lab Results  Component Value Date   CREATININE 0.91 03/19/2020   CREATININE 0.92 02/13/2019   CREATININE 0.90 01/08/2019    Lab Results  Component Value Date   WBC 7.6 03/19/2020   HGB 10.9 (L) 03/19/2020   HCT 33.5 (L) 03/19/2020   PLT 260.0 03/19/2020   GLUCOSE 100 (H) 03/19/2020   CHOL 145 03/19/2020   TRIG 153.0 (H) 03/19/2020   HDL 45.10 03/19/2020   LDLDIRECT 157.0 11/10/2016   LDLCALC 70 03/19/2020   ALT 18 03/19/2020   AST 19 03/19/2020   NA 138 03/19/2020   K 4.1 03/19/2020   CL 103 03/19/2020   CREATININE 0.91 03/19/2020   BUN 20 03/19/2020   CO2 26 03/19/2020   TSH 1.93 03/19/2020   INR 1.0 01/07/2019   HGBA1C 6.5 03/19/2020   MICROALBUR 1.6 03/19/2020      Assessment & Plan:   Problem List Items Addressed This Visit      Unprioritized   Type 2 diabetes mellitus with obesity ([place])    New diagnosis.  She prefers to defer use of  Metformin since she is losing weight with dietary changes . Marland Kitchen  Continue atorvastatin and aspirin   Lab Results  Component Value Date   HGBA1C 6.5 03/19/2020    Lab Results  Component Value Date   MICROALBUR 1.6 03/19/2020           Onychomycosis of toenail    Podiatry referral in progress      Obesity (BMI 30.0-34.9)    I have addressed  BMI and recommended a low glycemic index diet utilizing smaller more frequent meals to increase metabolism.  I have also recommended that patient start exercising with a goal of 30 minutes of aerobic exercise a minimum of 5 days per week.       Anemia - Primary    Etiology unclear..  Workup in progress.  Up to date on colon ca screening and post menopausal      Relevant Orders   IBC + Ferritin (Completed)   Vitamin B12 (Completed)   RBC Folate   Protein electrophoresis, serum   IFE AND PE, RANDOM URINE        I am having Wardell Heath maintain her aspirin, nitroGLYCERIN, busPIRone, venlafaxine XR, atorvastatin, omeprazole, and metoprolol succinate.  No orders of the defined types were placed in this encounter.   There are no discontinued medications.  Follow-up: Return in about 3 months (around 07/07/2020) for follow up diabetes.   [name], MD

## 2020-04-06 NOTE — Assessment & Plan Note (Signed)
Etiology unclear..  Workup in progress.  Up to date on colon ca screening and post menopausal

## 2020-04-06 NOTE — Assessment & Plan Note (Signed)
Podiatry referral in progress

## 2020-04-07 LAB — IFE AND PE, RANDOM URINE
% BETA, Urine: 25.2 %
ALBUMIN, U: 36.1 %
ALPHA 1 URINE: 2.8 %
ALPHA-2-GLOBULIN, U: 13.3 %
GAMMA GLOBULIN URINE: 22.6 %
Protein, Ur: 9.4 mg/dL

## 2020-04-09 ENCOUNTER — Other Ambulatory Visit: Payer: Self-pay | Admitting: Internal Medicine

## 2020-04-09 DIAGNOSIS — Z130 Encounter for screening for diseases of the blood and blood-forming organs and certain disorders involving the immune mechanism: Secondary | ICD-10-CM

## 2020-04-09 DIAGNOSIS — D509 Iron deficiency anemia, unspecified: Secondary | ICD-10-CM

## 2020-04-09 LAB — PROTEIN ELECTROPHORESIS, SERUM
Albumin ELP: 3.6 g/dL — ABNORMAL LOW (ref 3.8–4.8)
Alpha 1: 0.3 g/dL (ref 0.2–0.3)
Alpha 2: 0.9 g/dL (ref 0.5–0.9)
Beta 2: 0.4 g/dL (ref 0.2–0.5)
Beta Globulin: 0.5 g/dL (ref 0.4–0.6)
Gamma Globulin: 1.1 g/dL (ref 0.8–1.7)
Total Protein: 6.8 g/dL (ref 6.1–8.1)

## 2020-04-09 LAB — FOLATE RBC: RBC Folate: 853 ng/mL RBC (ref >280)

## 2020-04-16 ENCOUNTER — Telehealth: Payer: Self-pay | Admitting: Internal Medicine

## 2020-04-16 LAB — HM PAP SMEAR: HM Pap smear: NORMAL

## 2020-04-16 MED ORDER — BLOOD GLUCOSE METER KIT: PACK | 0 refills | Status: DC

## 2020-04-16 NOTE — Telephone Encounter (Signed)
rx has been printed and placed in quick sign folder.

## 2020-04-16 NOTE — Telephone Encounter (Signed)
Pt called in stated she need a glucometer, she needs a call back [phone]

## 2020-04-17 MED ORDER — BLOOD GLUCOSE METER KIT: PACK | 0 refills | Status: DC

## 2020-04-17 NOTE — Telephone Encounter (Signed)
Pt called and wanted this printed for her to pick up and take to another Pharmacy

## 2020-04-17 NOTE — Telephone Encounter (Signed)
I have reprinted rx and placed in quick sign folder. Pt stated that she would be here Monday morning to pick it up.

## 2020-04-17 NOTE — Telephone Encounter (Signed)
Pt called about rx for glucometer. Wants to pick it up today

## 2020-04-17 NOTE — Telephone Encounter (Signed)
Pt is aware that rx has been faxed to [facility] on [address]

## 2020-04-20 ENCOUNTER — Other Ambulatory Visit: Payer: Self-pay

## 2020-04-20 MED ORDER — BLOOD GLUCOSE METER KIT: PACK | 0 refills | Status: DC

## 2020-04-21 LAB — HM DIABETES EYE EXAM

## 2020-04-22 ENCOUNTER — Ambulatory Visit: Payer: Medicare Other | Admitting: Podiatry

## 2020-04-22 ENCOUNTER — Other Ambulatory Visit: Payer: Self-pay

## 2020-04-22 ENCOUNTER — Encounter: Payer: Self-pay | Admitting: Podiatry

## 2020-04-22 DIAGNOSIS — L608 Other nail disorders: Secondary | ICD-10-CM | POA: Diagnosis not present

## 2020-04-22 DIAGNOSIS — L603 Nail dystrophy: Secondary | ICD-10-CM

## 2020-04-22 NOTE — Progress Notes (Signed)
Subjective:  Patient ID: Kayla Farmer, female    DOB: 09-Apr-1966,  MRN: [id number] HPI Chief Complaint  Patient presents with   Nail Problem    3rd toenail right - thick, discolored, lifted x few months, had 1st nails removed permanently for same issue   Diabetes    last a1c was 6.2   New Patient (Initial Visit)    54 y.o. female presents with the above complaint.   ROS: Denies fever chills nausea vomiting muscle aches pains calf pain back pain chest pain shortness of breath.  Past Medical History:  Diagnosis Date   Anxiety    Arthritis    CAD (coronary artery disease)    a. 12/2018 NSTEMI/PCI: LM nl, LAD 30p, 88m(2.25x22 Resolute Onyx DES), LCX nl, OM2 nl, RCA 436mRPDA 100 w/ L->R collats (likely culprit - med rx). EF 50-55%.   [facility]   a. 07/2019 - L>R.   Depression    GERD (gastroesophageal reflux disease)    History of cervical cancer    History of tobacco abuse    a. 30 pack year - quit 12/2018 @ time of NSTEMI.   Obesity    Other inflammations of eyelids    Positive colorectal cancer screening using Cologuard test 2019   Sinus infection    Past Surgical History:  Procedure Laterality Date   CARDIAC CATHETERIZATION     COLONOSCOPY WITH PROPOFOL N/A 01/24/2018   Procedure: COLONOSCOPY WITH PROPOFOL;  Surgeon: ByRobert BellowMD;  Location: [facility];  Service: Endoscopy;  Laterality: N/A;   CORONARY STENT INTERVENTION N/A 01/07/2019   Procedure: CORONARY STENT INTERVENTION;  Surgeon: ArWellington HampshireMD;  [facility];  Service: Cardiovascular;  Laterality: N/A;  LAD   csection x2     DILATION AND CURETTAGE OF UTERUS     LEFT HEART CATH AND CORONARY ANGIOGRAPHY N/A 01/07/2019   Procedure: LEFT HEART CATH AND CORONARY ANGIOGRAPHY;  Surgeon: ArWellington HampshireMD;  [facility];  Service: Cardiovascular;  Laterality: N/A;   LIVER BIOPSY  04/26/2017   TONSILLECTOMY      Current Outpatient  Medications:    busPIRone (BUSPAR) 30 MG tablet, Take 1 tablet (30 mg total) by mouth 3 (three) times daily. As needed for anxiety (Patient taking differently: Take 30 mg by mouth daily. ), Disp: 90 tablet, Rfl: 2   ACCU-CHEK GUIDE test strip, , Disp: , Rfl:    Accu-Chek Softclix Lancets lancets, SMARTSIG:Topical, Disp: , Rfl:    aspirin EC 81 MG EC tablet, Take 1 tablet (81 mg total) by mouth daily., Disp: 30 tablet, Rfl: 0   atorvastatin (LIPITOR) 80 MG tablet, TAKE 1 TABLET(80 MG) BY MOUTH DAILY AT 6 PM, Disp: 30 tablet, Rfl: 0   blood glucose meter kit and supplies, Use to check blood sugars up to two times daily. Dispense based on patient and insurance preference., Disp: 1 each, Rfl: 0   metoprolol succinate (TOPROL-XL) 25 MG 24 hr tablet, TAKE 1 TABLET(25 MG) BY MOUTH DAILY, Disp: 30 tablet, Rfl: 0   omeprazole (PRILOSEC) 40 MG capsule, Take 1 capsule (40 mg total) by mouth daily., Disp: 90 capsule, Rfl: 1   venlafaxine XR (EFFEXOR-XR) 75 MG 24 hr capsule, TAKE 1 CAPSULE BY MOUTH EVERY DAY, Disp: 90 capsule, Rfl: 1  Allergies  Allergen Reactions   Codeine Itching   Review of Systems Objective:  There were no vitals filed for this visit.  General: Well developed,  nourished, in no acute distress, alert and oriented x3   Dermatological: Skin is warm, dry and supple bilateral. Nails x 10 are well maintained; remaining integument appears unremarkable at this time. There are no open sores, no preulcerative lesions, no rash or signs of infection present.  Nail plate third right demonstrates distal onycholysis with subungual thickening and thickening of the nail plate distally.  Vascular: Dorsalis Pedis artery and Posterior Tibial artery pedal pulses are 2/4 bilateral with immedate capillary fill time. Pedal hair growth present. No varicosities and no lower extremity edema present bilateral.   Neruologic: Grossly intact via light touch bilateral. Vibratory intact via tuning fork  bilateral. Protective threshold with Semmes Wienstein monofilament intact to all pedal sites bilateral. Patellar and Achilles deep tendon reflexes 2+ bilateral. No Babinski or clonus noted bilateral.   Musculoskeletal: No gross boney pedal deformities bilateral. No pain, crepitus, or limitation noted with foot and ankle range of motion bilateral. Muscular strength 5/5 in all groups tested bilateral.  Gait: Unassisted, Nonantalgic.    Radiographs:  None taken  Assessment & Plan:   Assessment: Nail dystrophy third right  Plan: Samples of the skin and nail were taken today to be sent for pathologic evaluation we will follow-up with her in 1 month     Max T. [place], [place]

## 2020-04-27 ENCOUNTER — Ambulatory Visit (INDEPENDENT_AMBULATORY_CARE_PROVIDER_SITE_OTHER): Payer: Medicare Other

## 2020-04-27 VITALS — Ht 63.0 in | Wt 202.0 lb

## 2020-04-27 DIAGNOSIS — Z0000 Encounter for general adult medical examination without abnormal findings: Secondary | ICD-10-CM

## 2020-04-27 NOTE — Progress Notes (Addendum)
Subjective:   Kayla Farmer is a 54 y.o. female who presents for Medicare Annual (Subsequent) preventive examination.  Review of Systems    No ROS.  Medicare Wellness Virtual Visit.  Cardiac Risk Factors include: advanced age (>53mn, >>62women);diabetes mellitus     Objective:    Today's Vitals   04/27/20 1043  Weight: 202 lb (91.6 kg)  Height: 5' 3"  (1.6 m)   Body mass index is 35.78 kg/m.  Advanced Directives 04/27/2020 01/31/2019 01/07/2019 01/07/2019 01/24/2018 04/26/2017  Does Patient Have a Medical Advance Directive? No No No No No No  Does patient want to make changes to medical advance directive? - Yes (MAU/Ambulatory/Procedural Areas - Information given) - - - -  Would patient like information on creating a medical advance directive? No - Patient declined - No - Guardian declined No - Guardian declined - -    Current Medications (verified) Outpatient Encounter Medications as of 04/27/2020  Medication Sig   ACCU-CHEK GUIDE test strip    Accu-Chek Softclix Lancets lancets SMARTSIG:Topical   aspirin EC 81 MG EC tablet Take 1 tablet (81 mg total) by mouth daily.   atorvastatin (LIPITOR) 80 MG tablet TAKE 1 TABLET(80 MG) BY MOUTH DAILY AT 6 PM   blood glucose meter kit and supplies Use to check blood sugars up to two times daily. Dispense based on patient and insurance preference.   busPIRone (BUSPAR) 30 MG tablet Take 1 tablet (30 mg total) by mouth 3 (three) times daily. As needed for anxiety (Patient taking differently: Take 30 mg by mouth daily. )   metoprolol succinate (TOPROL-XL) 25 MG 24 hr tablet TAKE 1 TABLET(25 MG) BY MOUTH DAILY   omeprazole (PRILOSEC) 40 MG capsule Take 1 capsule (40 mg total) by mouth daily.   venlafaxine XR (EFFEXOR-XR) 75 MG 24 hr capsule TAKE 1 CAPSULE BY MOUTH EVERY DAY   No facility-administered encounter medications on file as of 04/27/2020.    Allergies (verified) Codeine   History: Past Medical History:  Diagnosis Date   Anxiety     Arthritis    CAD (coronary artery disease)    a. 12/2018 NSTEMI/PCI: LM nl, LAD 30p, 845m2.25x22 Resolute Onyx DES), LCX nl, OM2 nl, RCA 4032mPDA 100 w/ L->R collats (likely culprit - med rx). EF 50-55%.   [facility]  [facility] 07/2019 - L>R.   Depression    GERD (gastroesophageal reflux disease)    History of cervical cancer    History of tobacco abuse    a. 30 pack year - quit 12/2018 @ time of NSTEMI.   Obesity    Other inflammations of eyelids    Positive colorectal cancer screening using Cologuard test 2019   Sinus infection    Past Surgical History:  Procedure Laterality Date   CARDIAC CATHETERIZATION     COLONOSCOPY WITH PROPOFOL N/A 01/24/2018   Procedure: COLONOSCOPY WITH PROPOFOL;  Surgeon: ByrRobert BellowD;  Location: [facility]COPY;  Service: Endoscopy;  Laterality: N/A;   CORONARY STENT INTERVENTION N/A 01/07/2019   Procedure: CORONARY STENT INTERVENTION;  Surgeon: AriWellington HampshireD;  [facility];  Service: Cardiovascular;  Laterality: N/A;  LAD   csection x2     DILATION AND CURETTAGE OF UTERUS     LEFT HEART CATH AND CORONARY ANGIOGRAPHY N/A 01/07/2019   Procedure: LEFT HEART CATH AND CORONARY ANGIOGRAPHY;  Surgeon: AriWellington HampshireD;  [facility];  Service: Cardiovascular;  Laterality: N/A;  LIVER BIOPSY  04/26/2017   TONSILLECTOMY     Family History  Problem Relation Age of Onset   Coronary artery disease Mother    Coronary artery disease Father        smoker   Breast cancer Neg Hx    Colon cancer Neg Hx    Social History   Socioeconomic History   Marital status: Married    Spouse name: Not on file   Number of children: Not on file   Years of education: Not on file   Highest education level: Not on file  Occupational History   Not on file  Tobacco Use   Smoking status: Former Smoker    Packs/day: 1.00    Years: 30.00    Pack years: 30.00    Types: Cigarettes    Quit date: 01/06/2019    Years  since quitting: 1.3   Smokeless tobacco: Never Used   Tobacco comment: smokes one pack of cigarettes per day since age 20, quit during pregnancies.  Vaping Use   Vaping Use: Never used  Substance and Sexual Activity   Alcohol use: No    Alcohol/week: 0.0 standard drinks   Drug use: No   Sexual activity: Not on file  Other Topics Concern   Not on file  Social History Narrative   Lives with spouse and children.  Daughter in Sports coach of Loletha Carrow and Josph Macho.   Quit high school in the 9th grade.  Was raised by grandmother due to parental conflicts.   Social Determinants of Health   Financial Resource Strain: Low Risk    Difficulty of Paying Living Expenses: Not hard at all  Food Insecurity: No Food Insecurity   Worried About Charity fundraiser in the Last Year: Never true   [place] in the Last Year: Never true  Transportation Needs: No Transportation Needs   Lack of Transportation (Medical): No   Lack of Transportation (Non-Medical): No  Physical Activity:    Days of Exercise per Week:    Minutes of Exercise per Session:   Stress: No Stress Concern Present   Feeling of Stress : Not at all  Social Connections: Unknown   Frequency of Communication with Friends and Family: Not on file   Frequency of Social Gatherings with Friends and Family: More than three times a week   Attends Religious Services: Not on Electrical engineer or Organizations: Not on file   Attends Archivist Meetings: Not on file   Marital Status: Married    Tobacco Counseling Counseling given: Not Answered Comment: smokes one pack of cigarettes per day since age 32, quit during pregnancies.   Clinical Intake:  Pre-visit preparation completed: Yes        Diabetes: No  How often do you need to have someone help you when you read instructions, pamphlets, or other written materials from your doctor or pharmacy?: 1 - Never Interpreter Needed?: No      Activities of Daily  Living In your present state of health, do you have any difficulty performing the following activities: 04/27/2020  Hearing? N  Vision? N  Difficulty concentrating or making decisions? N  Walking or climbing stairs? N  Dressing or bathing? N  Doing errands, shopping? N  Preparing Food and eating ? N  Using the Toilet? N  In the past six months, have you accidently leaked urine? N  Do you have problems with loss of bowel control? N  Managing your Medications? N  Managing your Finances? N  Housekeeping or managing your Housekeeping? N  Some recent data might be hidden    Patient Care Team: [name], MD as PCP - General (Internal Medicine) Wellington Hampshire, MD as PCP - Cardiology (Cardiology) [name], MD (Internal Medicine) Bary Castilla Forest Gleason, MD (General Surgery)  Indicate any recent Medical Services you may have received from other than [facility] providers in the past year (date may be approximate).     Assessment:   This is a routine wellness examination for [facility].  I connected with Tomma today by telephone and verified that I am speaking with the correct person using two identifiers. Location patient: home Location provider: work Persons participating in the virtual visit: patient, Marine scientist.    I discussed the limitations, risks, security and privacy concerns of performing an evaluation and management service by telephone and the availability of in person appointments. The patient expressed understanding and verbally consented to this telephonic visit.    Interactive audio and video telecommunications were attempted between this provider and patient, however failed, due to patient having technical difficulties OR patient did not have access to video capability.  We continued and completed visit with audio only.  Some vital signs may be absent or patient reported.   Hearing/Vision screen  Hearing Screening   125Hz  250Hz  500Hz  1000Hz  2000Hz  3000Hz  4000Hz  6000Hz  8000Hz     Right ear:           Left ear:           Comments: Patient is able to hear conversational tones without difficulty.  No issues reported.  Vision Screening Comments: Followed by [facility] readers lenses Visual acuity not assessed, virtual visit.  They have seen their ophthalmologist in the last 12 months.     Dietary issues and exercise activities discussed: Current Exercise Habits: Home exercise routine, Type of exercise: walking, Intensity: Mild  Healthy diet Good water intake   Goals       Patient Stated     Portion control (pt-stated)      I want to lose weight Stay active       Depression Screen PHQ 2/9 Scores 04/27/2020 03/19/2020 01/31/2019 10/22/2018 06/08/2017  PHQ - 2 Score 2 3 0 2 5  PHQ- 9 Score 4 13 - 10 17    Fall Risk Fall Risk  04/27/2020 04/06/2020 03/19/2020 08/19/2019 01/31/2019  Falls in the past year? 0 0 0 0 0  Number falls in past yr: 0 - - - -  Follow up Falls evaluation completed Falls evaluation completed Falls evaluation completed Falls evaluation completed -   Handrails in use when climbing stairs? Yes Home free of loose throw rugs in walkways, pet beds, electrical cords, etc? Yes  Adequate lighting in your home to reduce risk of falls? Yes   ASSISTIVE DEVICES UTILIZED TO PREVENT FALLS:  Life alert? No  Use of a cane, walker or w/c? No  Grab bars in the bathroom? No  Shower chair or bench in shower? No  Elevated toilet seat or a handicapped toilet? No   TIMED UP AND GO:  Was the test performed? No . Virtual visits.   Cognitive Function:     6CIT Screen 04/27/2020 01/31/2019  What Year? 0 points 0 points  What month? 0 points 0 points  What time? 0 points 0 points  Count back from 20 0 points 0 points  Months in reverse 0 points 0 points  Repeat phrase - 0 points  Total Score - 0    Immunizations Immunization History  Administered Date(s) Administered   Tdap 05/28/2013    Health Maintenance Health Maintenance  Topic  Date Due   PAP SMEAR-Modifier  11/11/2019   HEMOGLOBIN A1C  09/19/2020   COLONOSCOPY  01/24/2021   URINE MICROALBUMIN  03/19/2021   FOOT EXAM  04/06/2021   OPHTHALMOLOGY EXAM  04/13/2021   MAMMOGRAM  04/24/2021   TETANUS/TDAP  05/29/2023   Hepatitis C Screening  Completed   HIV Screening  Completed   INFLUENZA VACCINE  Discontinued   PNEUMOCOCCAL POLYSACCHARIDE VACCINE AGE 64-64 HIGH RISK  Discontinued   COVID-19 Vaccine  Discontinued    Dental Screening: Recommended annual dental exams for proper oral hygiene  Community Resource Referral / Chronic Care Management: CRR required this visit?  No  CCM required this visit?  No      Plan:   Keep all routine maintenance appointments.   Next scheduled lab 05/26/20 @ 8:00  Follow up 07/13/20 @ 9:30  Cpe 09/22/19 @ 9:00  I have personally reviewed and noted the following in the patient's chart:   Medical and social history Use of alcohol, tobacco or illicit drugs  Current medications and supplements Functional ability and status Nutritional status Physical activity Advanced directives List of other physicians Hospitalizations, surgeries, and ER visits in previous 12 months Vitals Screenings to include cognitive, depression, and falls Referrals and appointments  In addition, I have reviewed and discussed with patient certain preventive protocols, quality metrics, and best practice recommendations. A written personalized care plan for preventive services as well as general preventive health recommendations were provided to patient via mail.     OBrien-Blaney, Shakyla Nolley L, LPN   [id number]/[id number]     I have reviewed the above information and agree with above.   Deborra Medina, MD

## 2020-04-27 NOTE — Patient Instructions (Addendum)
Kayla Farmer , Thank you for taking time to come for your Medicare Wellness Visit. I appreciate your ongoing commitment to your health goals. Please review the following plan we discussed and let me know if I can assist you in the future.   These are the goals we discussed: Goals      Patient Stated   .  Portion control (pt-stated)      I want to lose weight Stay active       This is a list of the screening recommended for you and due dates:  Health Maintenance  Topic Date Due  . Pap Smear  11/11/2019  . Hemoglobin A1C  09/19/2020  . Colon Cancer Screening  01/24/2021  . Urine Protein Check  03/19/2021  . Complete foot exam   04/06/2021  . Eye exam for diabetics  04/13/2021  . Mammogram  04/24/2021  . Tetanus Vaccine  05/29/2023  .  Hepatitis C: One time screening is recommended by Center for Disease Control  (CDC) for  adults born from [birth year] through 1967.   Completed  . HIV Screening  Completed  . Flu Shot  Discontinued  . Pneumococcal vaccine  Discontinued  . COVID-19 Vaccine  Discontinued    Immunizations Immunization History  Administered Date(s) Administered  . Tdap 05/28/2013   Keep all routine maintenance appointments.   Next scheduled lab 05/26/20 @ 8:00  Follow up 07/13/20 @ 9:30  Cpe 09/22/19 @ 9:00  Advanced directives: declined  Conditions/risks identified: none new  Next appointment: Follow up in one year for your annual wellness visit.   Preventive Care 40-64 Years, Female Preventive care refers to lifestyle choices and visits with your health care provider that can promote health and wellness. What does preventive care include?  A yearly physical exam. This is also called an annual well check.  Dental exams once or twice a year.  Routine eye exams. Ask your health care provider how often you should have your eyes checked.  Personal lifestyle choices, including:  Daily care of your teeth and gums.  Regular physical activity.  Eating a  healthy diet.  Avoiding tobacco and drug use.  Limiting alcohol use.  Practicing safe sex.  Taking low-dose aspirin daily starting at age 49.  Taking vitamin and mineral supplements as recommended by your health care provider. What happens during an annual well check? The services and screenings done by your health care provider during your annual well check will depend on your age, overall health, lifestyle risk factors, and family history of disease. Counseling  Your health care provider may ask you questions about your:  Alcohol use.  Tobacco use.  Drug use.  Emotional well-being.  Home and relationship well-being.  Sexual activity.  Eating habits.  Work and work Statistician.  Method of birth control.  Menstrual cycle.  Pregnancy history. Screening  You may have the following tests or measurements:  Height, weight, and BMI.  Blood pressure.  Lipid and cholesterol levels. These may be checked every 5 years, or more frequently if you are over 52 years old.  Skin check.  Lung cancer screening. You may have this screening every year starting at age 22 if you have a 30-pack-year history of smoking and currently smoke or have quit within the past 15 years.  Fecal occult blood test (FOBT) of the stool. You may have this test every year starting at age 51.  Flexible sigmoidoscopy or colonoscopy. You may have a sigmoidoscopy every 5 years or a colonoscopy  every 10 years starting at age 70.  Hepatitis C blood test.  Hepatitis B blood test.  Sexually transmitted disease (STD) testing.  Diabetes screening. This is done by checking your blood sugar (glucose) after you have not eaten for a while (fasting). You may have this done every 1-3 years.  Mammogram. This may be done every 1-2 years. Talk to your health care provider about when you should start having regular mammograms. This may depend on whether you have a family history of breast cancer.  BRCA-related  cancer screening. This may be done if you have a family history of breast, ovarian, tubal, or peritoneal cancers.  Pelvic exam and Pap test. This may be done every 3 years starting at age 31. Starting at age 43, this may be done every 5 years if you have a Pap test in combination with an HPV test.  Bone density scan. This is done to screen for osteoporosis. You may have this scan if you are at high risk for osteoporosis. Discuss your test results, treatment options, and if necessary, the need for more tests with your health care provider. Vaccines  Your health care provider may recommend certain vaccines, such as:  Influenza vaccine. This is recommended every year.  Tetanus, diphtheria, and acellular pertussis (Tdap, Td) vaccine. You may need a Td booster every 10 years.  Zoster vaccine. You may need this after age 54.  Pneumococcal 13-valent conjugate (PCV13) vaccine. You may need this if you have certain conditions and were not previously vaccinated.  Pneumococcal polysaccharide (PPSV23) vaccine. You may need one or two doses if you smoke cigarettes or if you have certain conditions. Talk to your health care provider about which screenings and vaccines you need and how often you need them. This information is not intended to replace advice given to you by your health care provider. Make sure you discuss any questions you have with your health care provider. Document Released: 09/25/2015 Document Revised: 05/18/2016 Document Reviewed: 06/30/2015 Elsevier Interactive Patient Education  2017 [employer] Prevention in the Home Falls can cause injuries. They can happen to people of all ages. There are many things you can do to make your home safe and to help prevent falls. What can I do on the outside of my home?  Regularly fix the edges of walkways and driveways and fix any cracks.  Remove anything that might make you trip as you walk through a door, such as a raised step or  threshold.  Trim any bushes or trees on the path to your home.  Use bright outdoor lighting.  Clear any walking paths of anything that might make someone trip, such as rocks or tools.  Regularly check to see if handrails are loose or broken. Make sure that both sides of any steps have handrails.  Any raised decks and porches should have guardrails on the edges.  Have any leaves, snow, or ice cleared regularly.  Use sand or salt on walking paths during winter.  Clean up any spills in your garage right away. This includes oil or grease spills. What can I do in the bathroom?  Use night lights.  Install grab bars by the toilet and in the tub and shower. Do not use towel bars as grab bars.  Use non-skid mats or decals in the tub or shower.  If you need to sit down in the shower, use a plastic, non-slip stool.  Keep the floor dry. Clean up any water that  spills on the floor as soon as it happens.  Remove soap buildup in the tub or shower regularly.  Attach bath mats securely with double-sided non-slip rug tape.  Do not have throw rugs and other things on the floor that can make you trip. What can I do in the bedroom?  Use night lights.  Make sure that you have a light by your bed that is easy to reach.  Do not use any sheets or blankets that are too big for your bed. They should not hang down onto the floor.  Have a firm chair that has side arms. You can use this for support while you get dressed.  Do not have throw rugs and other things on the floor that can make you trip. What can I do in the kitchen?  Clean up any spills right away.  Avoid walking on wet floors.  Keep items that you use a lot in easy-to-reach places.  If you need to reach something above you, use a strong step stool that has a grab bar.  Keep electrical cords out of the way.  Do not use floor polish or wax that makes floors slippery. If you must use wax, use non-skid floor wax.  Do not have  throw rugs and other things on the floor that can make you trip. What can I do with my stairs?  Do not leave any items on the stairs.  Make sure that there are handrails on both sides of the stairs and use them. Fix handrails that are broken or loose. Make sure that handrails are as long as the stairways.  Check any carpeting to make sure that it is firmly attached to the stairs. Fix any carpet that is loose or worn.  Avoid having throw rugs at the top or bottom of the stairs. If you do have throw rugs, attach them to the floor with carpet tape.  Make sure that you have a light switch at the top of the stairs and the bottom of the stairs. If you do not have them, ask someone to add them for you. What else can I do to help prevent falls?  Wear shoes that:  Do not have high heels.  Have rubber bottoms.  Are comfortable and fit you well.  Are closed at the toe. Do not wear sandals.  If you use a stepladder:  Make sure that it is fully opened. Do not climb a closed stepladder.  Make sure that both sides of the stepladder are locked into place.  Ask someone to hold it for you, if possible.  Clearly mark and make sure that you can see:  Any grab bars or handrails.  First and last steps.  Where the edge of each step is.  Use tools that help you move around (mobility aids) if they are needed. These include:  Canes.  Walkers.  Scooters.  Crutches.  Turn on the lights when you go into a dark area. Replace any light bulbs as soon as they burn out.  Set up your furniture so you have a clear path. Avoid moving your furniture around.  If any of your floors are uneven, fix them.  If there are any pets around you, be aware of where they are.  Review your medicines with your doctor. Some medicines can make you feel dizzy. This can increase your chance of falling. Ask your doctor what other things that you can do to help prevent falls. This information is not intended  to  replace advice given to you by your health care provider. Make sure you discuss any questions you have with your health care provider. Document Released: 06/25/2009 Document Revised: 02/04/2016 Document Reviewed: 10/03/2014 Elsevier Interactive Patient Education  2017 [employer].

## 2020-05-05 DIAGNOSIS — Z1211 Encounter for screening for malignant neoplasm of colon: Secondary | ICD-10-CM | POA: Diagnosis not present

## 2020-05-05 DIAGNOSIS — Z1231 Encounter for screening mammogram for malignant neoplasm of breast: Secondary | ICD-10-CM | POA: Diagnosis not present

## 2020-05-05 DIAGNOSIS — Z124 Encounter for screening for malignant neoplasm of cervix: Secondary | ICD-10-CM | POA: Diagnosis not present

## 2020-05-08 ENCOUNTER — Other Ambulatory Visit: Payer: Self-pay | Admitting: Cardiovascular Disease

## 2020-05-12 ENCOUNTER — Telehealth: Payer: Self-pay | Admitting: Podiatry

## 2020-05-12 NOTE — Telephone Encounter (Signed)
Phoned patient to inform her lab results take at least 3 weeks. Her voice mailbox is full. Unable to leave message. Will attempt another call today.

## 2020-05-12 NOTE — Telephone Encounter (Signed)
Called patient and informed her results for toenails take at least 3 weeks. She thanked me and stated she will keep scheduled appt on 9/13 for now.

## 2020-05-12 NOTE — Telephone Encounter (Signed)
Could you please let her know that it has not come back yet.  I told her that it would be atleast three weeks.

## 2020-05-13 ENCOUNTER — Encounter: Payer: Self-pay | Admitting: *Deleted

## 2020-05-19 ENCOUNTER — Ambulatory Visit
Admission: RE | Admit: 2020-05-19 | Discharge: 2020-05-19 | Disposition: A | Discharge status: Home / Self Care | Payer: Medicare Other | Source: Ambulatory Visit | Attending: Internal Medicine | Admitting: Internal Medicine

## 2020-05-19 DIAGNOSIS — Z1231 Encounter for screening mammogram for malignant neoplasm of breast: Secondary | ICD-10-CM | POA: Insufficient documentation

## 2020-05-21 ENCOUNTER — Telehealth: Payer: Self-pay

## 2020-05-21 NOTE — Telephone Encounter (Signed)
Patient has been notified of results and will keep appt for Monday to discuss treatment options

## 2020-05-21 NOTE — Telephone Encounter (Signed)
-----   Message from Garrel Ridgel, [place] sent at 05/20/2020  7:28 AM EDT ----- Positive for fungus.

## 2020-05-22 ENCOUNTER — Telehealth: Payer: Self-pay | Admitting: Internal Medicine

## 2020-05-22 MED ORDER — VENLAFAXINE HCL ER 75 MG PO CP24: ORAL_CAPSULE | ORAL | 1 refills | Status: DC

## 2020-05-22 NOTE — Telephone Encounter (Signed)
Pt needs refill on venlafaxine XR (EFFEXOR-XR) 75 MG 24 hr capsule ASAP.

## 2020-05-25 ENCOUNTER — Other Ambulatory Visit: Payer: Self-pay

## 2020-05-25 ENCOUNTER — Encounter: Payer: Self-pay | Admitting: Podiatry

## 2020-05-25 ENCOUNTER — Ambulatory Visit: Payer: Medicare Other | Admitting: Podiatry

## 2020-05-25 DIAGNOSIS — Z79899 Other long term (current) drug therapy: Secondary | ICD-10-CM | POA: Diagnosis not present

## 2020-05-25 DIAGNOSIS — L603 Nail dystrophy: Secondary | ICD-10-CM

## 2020-05-25 MED ORDER — ITRACONAZOLE 100 MG PO CAPS
100.0000 mg | ORAL_CAPSULE | Freq: Two times a day (BID) | ORAL | 0 refills | Status: DC
Start: 2020-05-25 — End: 2020-09-21

## 2020-05-25 NOTE — Progress Notes (Signed)
She presents today for follow-up of her nail dystrophy.  Her nail dystrophy has not changed she states.  She is most concerned about the third toe on the right foot.  Objective: Vital signs are stable alert oriented x3.  Pulses are palpable.  On third toes unchanged it appears that pathology results demonstrate a dermatophyte and two saprophytic mold type organisms.   Assessment: Onychomycosis.  Plan: Discussed the pros and cons of use of oral itraconazole.  This drug is the only drug that will get rid of this type of organism I have never seen terbinafine work on this.  Currently she has a good liver profile and BUN and creatinine are normal.  We will follow-up with her in 1 month for another blood work complete metabolic panel at that time please

## 2020-05-25 NOTE — Patient Instructions (Addendum)
Itraconazole capsules and tablets What is this medicine? ITRACONAZOLE (i tra KO na zole) is an antifungal medicine. It is used to treat certain kinds of fungal or yeast infections. This medicine may be used for other purposes; ask your health care provider or pharmacist if you have questions. COMMON BRAND NAME(S): ONMEL, Sporanox, TOLSURA What should I tell my health care provider before I take this medicine? They need to know if you have any of these conditions:  heart disease  history of irregular heartbeat  immune system problems  kidney disease  liver disease  lung or breathing disease  an unusual or allergic reaction to itraconazole, or other antifungal medicines, foods, dyes or preservatives  pregnant or trying to get pregnant  breast-feeding How should I use this medicine? Take this medicine by mouth with a full glass of water. Follow the directions on the prescription label. Take this medicine with food. Avoid taking antacids, H2-blockers, or proton pump inhibitors within 2 hours of taking this medicine. It is best to separate these medicines by 2 hours. Take your medicine at regular intervals. Do not take your medicine more often than directed. Take all of your medicine as directed even if you think you are better. Do not skip doses or stop your medicine early. Talk to your pediatrician regarding the use of this medicine in children. Special care may be needed. Overdosage: If you think you have taken too much of this medicine contact a poison control center or emergency room at once. NOTE: This medicine is only for you. Do not share this medicine with others. What if I miss a dose? If you miss a dose, take it as soon as you can. If it is almost time for your next dose, take only that dose. Do not take double or extra doses. What may interact with this medicine? Do not take this medicine with any of the following medications:  alfuzosin  alprazolam  avanafil  certain  medicines for blood pressure like felodipine, nisoldipine  certain medicines for cholesterol like cerivastatin, lovastatin, simvastatin, lomitapide  certain medicines for the heart like disopyramide, dofetilide, dronedarone, eplerenone, ivabradine, quinidine, ranolazine  cisapride  colchicine (if you have liver or kidney problems)  conivaptan  ergot alkaloids like dihydroergotamine, ergonovine, ergotamine, methylergonovine  irinotecan  isavuconazole  lurasidone  methadone  midazolam  naloxegol  nevirapine  other medicines that prolong the QT interval (cause an abnormal heart rhythm)  pimozide  red yeast rice  sirolimus  thioridazine  ticagrelor  triazolam This medicine may also interact with the following medications:  aliskiren  amlodipine  antacids  antiviral medicines for HIV or AIDS  aprepitant  atorvastatin  buprenorphine  certain antibiotics like ciprofloxacin, clarithromycin, erythromycin  certain medicines for bladder problems like fesoterodine, solifenacin, tolterodine  certain medicines for cancer like axitinib, bortezomib, busulfan, dabrafenib, dasatinib, docetaxel, erlotinib, gefitinib, ibrutinib, imatinib, ixabepilone, lapatinib, nilotinib, ponatinib, sunitinib, trabectedin, trimetrexate, vinca alkaloids  certain medicines for depression, anxiety, or psychotic disturbances like aripiprazole, buspirone, diazepam, haloperidol, perospirone, quetiapine, risperidone  certain medicines for erectile dysfunction like vardenafil, sildenafil, tadalafil  certain medicines for pain like alfentanil, fentanyl, oxycodone, sufentanil  certain medicines for stomach problems like cimetidine, famotidine, omeprazole, lansoprazole  certain medicines that treat or prevent blood clots like warfarin, dabigatran, rivaroxaban  certain medicines for seizures like carbamazepine, phenytoin  certain medicines for tuberculosis like isoniazid, INH, rifabutin,  rifampin, rifapentine  cilostazol  cinacalcet  cyclosporine  digoxin  eletriptan  everolimus  halofantrine  isradipine  meloxicam  nadolol  nifedipine  other medicines for fungal infections  praziquantel  ramelteon  repaglinide  salmeterol  saxagliptin  steroid medicines like budesonide, ciclesonide, dexamethasone, fluticasone, methylprednisolone  tacrolimus  tamsulosin  tolvaptan  verapamil  ziprasidone This list may not describe all possible interactions. Give your health care provider a list of all the medicines, herbs, non-prescription drugs, or dietary supplements you use. Also tell them if you smoke, drink alcohol, or use illegal drugs. Some items may interact with your medicine. What should I watch for while using this medicine? Tell your doctor or healthcare professional if your symptoms do not start to get better or if they get worse. You may get drowsy or dizzy. Do not drive, use machinery, or do anything that needs mental alertness until you know how the medicine affects you. Do not stand or sit up quickly, especially if you are an older patient. This reduces the risk of dizzy or fainting spells. What side effects may I notice from receiving this medicine? Side effects that you should report to your doctor or health care professional as soon as possible:  allergic reactions like skin rash, itching or hives, swelling of the face, lips, or tongue  changes in hearing  pain, tingling, numbness in the hands or feet  signs and symptoms of heart failure like breathing problems; fast, irregular heartbeat; sudden weight gain; swelling of the ankles, feet; unusually weak or tired  signs and symptoms of liver injury like dark yellow or brown urine; general ill feeling or flu-like symptoms; light-colored stools; loss of appetite; right upper belly pain; yellowing of the eyes or skin Side effects that usually do not require medical attention (report to  your doctor or health care professional if they continue or are bothersome):  blurred vision  diarrhea  dizziness  headache  nausea, vomiting This list may not describe all possible side effects. Call your doctor for medical advice about side effects. You may report side effects to FDA at 1-800-FDA-1088. Where should I keep my medicine? Keep out of the reach of children. Store at room temperature between 15 and 25 degrees C (59 and 77 degrees F). Protect from light and moisture. Throw away any unused medicine after the expiration date. NOTE: This sheet is a summary. It may not cover all possible information. If you have questions about this medicine, talk to your doctor, pharmacist, or health care provider.  2020 Elsevier/Gold Standard (2018-08-20 13:38:38)

## 2020-05-26 ENCOUNTER — Other Ambulatory Visit: Payer: Self-pay

## 2020-05-26 ENCOUNTER — Other Ambulatory Visit (INDEPENDENT_AMBULATORY_CARE_PROVIDER_SITE_OTHER): Payer: Medicare Other

## 2020-05-26 DIAGNOSIS — D509 Iron deficiency anemia, unspecified: Secondary | ICD-10-CM | POA: Diagnosis not present

## 2020-05-26 LAB — CBC WITH DIFFERENTIAL/PLATELET
Basophils Absolute: 0.1 10*3/uL (ref 0.0–0.1)
Basophils Relative: 1 % (ref 0.0–3.0)
Eosinophils Absolute: 0.2 10*3/uL (ref 0.0–0.7)
Eosinophils Relative: 2.4 % (ref 0.0–5.0)
HCT: 34.1 % — ABNORMAL LOW (ref 36.0–46.0)
Hemoglobin: 11.1 g/dL — ABNORMAL LOW (ref 12.0–15.0)
Lymphocytes Relative: 29.2 % (ref 12.0–46.0)
Lymphs Abs: 2.5 10*3/uL (ref 0.7–4.0)
MCHC: 32.6 g/dL (ref 30.0–36.0)
MCV: 80.1 fl (ref 78.0–100.0)
Monocytes Absolute: 0.5 10*3/uL (ref 0.1–1.0)
Monocytes Relative: 5.6 % (ref 3.0–12.0)
Neutro Abs: 5.3 10*3/uL (ref 1.4–7.7)
Neutrophils Relative %: 61.8 % (ref 43.0–77.0)
Platelets: 242 10*3/uL (ref 150.0–400.0)
RBC: 4.26 Mil/uL (ref 3.87–5.11)
RDW: 15.9 % — ABNORMAL HIGH (ref 11.5–15.5)
WBC: 8.6 10*3/uL (ref 4.0–10.5)

## 2020-05-27 LAB — IRON,TIBC AND FERRITIN PANEL
%SAT: 16 % (calc) (ref 16–45)
Ferritin: 32 ng/mL (ref 16–232)
Iron: 46 ug/dL (ref 45–160)
TIBC: 287 mcg/dL (calc) (ref 250–450)

## 2020-05-29 ENCOUNTER — Telehealth: Payer: Self-pay

## 2020-05-29 NOTE — Telephone Encounter (Signed)
-----   Message from [name], MD sent at 05/28/2020  6:03 PM EDT ----- Her iron stores are normal.  Her anemia is unchanged.  I noticed that she is now taking an antifungal, prescribed by Dr Milinda Pointer.  This cannot be combined with atorvastatin because it can cause liver problems.  I notice that she has stopped taking atorvastatin, is this true?  If so,  please schedule an appt to discuss the pros' and cons of this.

## 2020-05-29 NOTE — Telephone Encounter (Signed)
lmtcb

## 2020-06-01 ENCOUNTER — Other Ambulatory Visit: Payer: Self-pay | Admitting: Cardiovascular Disease

## 2020-06-02 NOTE — Progress Notes (Signed)
No discussion needed if she is still willing to stay on the atorvastatin.  Given her history it is more important to continue this medication rather than stop it to treat the toenail fungus.  She can treat the toenail fungus with nightly filings and topical application of vinegar to the freshly filed surface

## 2020-06-03 ENCOUNTER — Telehealth: Payer: Self-pay | Admitting: Cardiovascular Disease

## 2020-06-03 NOTE — Telephone Encounter (Signed)
She needs to cut her atorvastatin dose to 20 mg (she's on 80) while on the itraconazole.  I'd just send in a new rx, since we're talking 4 months.  She will need to stop the omeprazole completely - can't take Pepcid/Zantac either, so if she has regular reflux/heartburn issues, she may need to take Tums.    Also an interaction with her buspirone.  She will need to reach out to whomever prescribes that to make recommendations on what to do.  She should figure all of this out BEFORE she starts the itraconazole

## 2020-06-03 NOTE — Telephone Encounter (Signed)
Patient went to her foot doctor, Dr. Milinda Pointer and wants to put patient on Itraconazole for 4 month due to a fungus. Patient is curious if this may be contraindicative and that it is okay for her to be on this. Dr. Quintella Baton he is going to keep a look out on her blood work for her liver

## 2020-06-03 NOTE — Telephone Encounter (Signed)
Spoke with the patient. Patient made aware of Kayla Farmer, PharmDs recommendations. Patient sts that she was not aware of all the possible interactions with her current medications.  She will hold off on starting Itraconazole and talk more with the podiatrist. It she decides to start the medication she will call back to update Dr. Fletcher Anon and she will discuss it with her pcp Dr. Derrel Nip as well.  Patient voiced appreciation for the call back and the assistance.

## 2020-06-03 NOTE — Telephone Encounter (Signed)
Msg fwd to PharmD to advise

## 2020-06-05 NOTE — Telephone Encounter (Signed)
Noted  

## 2020-06-05 NOTE — Telephone Encounter (Signed)
Pt is taking the Atorvastatin and is not taking the Antifungal at present since last phone call. Please see Lab Results from 05/26/20.

## 2020-06-05 NOTE — Telephone Encounter (Signed)
Need to know if patient taking atorvastatin, and if so needs to stop and needs appointment to discuss with Dr. Derrel Nip.

## 2020-06-10 ENCOUNTER — Telehealth: Payer: Self-pay

## 2020-06-10 NOTE — Telephone Encounter (Signed)
Patient called and wanted to let you know that she spoke with Cardiologist and he recommended her not to take the Itraconazole for her toenails.   This is Pharmacist, hospital

## 2020-07-06 ENCOUNTER — Encounter: Payer: Medicare Other | Admitting: Podiatry

## 2020-07-06 ENCOUNTER — Other Ambulatory Visit: Payer: Self-pay | Admitting: Cardiovascular Disease

## 2020-07-13 ENCOUNTER — Ambulatory Visit: Payer: Medicare Other | Admitting: Internal Medicine

## 2020-07-14 ENCOUNTER — Other Ambulatory Visit: Payer: Self-pay

## 2020-07-14 MED ORDER — BUSPIRONE HCL 30 MG PO TABS: 30.0000 mg | ORAL_TABLET | Freq: Three times a day (TID) | ORAL | 2 refills | Status: DC

## 2020-08-13 ENCOUNTER — Telehealth: Payer: Self-pay | Admitting: *Deleted

## 2020-08-13 NOTE — Telephone Encounter (Signed)
Copied from [place] [phone]. Topic: Appointment Scheduling - Prior Auth Required for Appointment >> Aug 15, 2019 10:17 AM Robina Ade, Helene Kelp D wrote: No appointment has been scheduled. Patient is requesting a work-in or same day appointment for right leg pain with knot. Per scheduling protocol, this appointment requires a prior authorization prior to scheduling.  Route to department's PEC pool.

## 2020-09-01 ENCOUNTER — Ambulatory Visit (INDEPENDENT_AMBULATORY_CARE_PROVIDER_SITE_OTHER): Payer: Medicare Other | Admitting: Family

## 2020-09-01 ENCOUNTER — Encounter: Payer: Self-pay | Admitting: Family

## 2020-09-01 ENCOUNTER — Other Ambulatory Visit: Payer: Self-pay

## 2020-09-01 VITALS — BP 110/70 | HR 87 | Ht 63.0 in | Wt 203.0 lb

## 2020-09-01 DIAGNOSIS — I25118 Atherosclerotic heart disease of native coronary artery with other forms of angina pectoris: Secondary | ICD-10-CM

## 2020-09-01 DIAGNOSIS — E785 Hyperlipidemia, unspecified: Secondary | ICD-10-CM

## 2020-09-01 MED ORDER — ATORVASTATIN CALCIUM 80 MG PO TABS: ORAL_TABLET | ORAL | 2 refills | Status: DC

## 2020-09-01 MED ORDER — METOPROLOL SUCCINATE ER 25 MG PO TB24: 25.0000 mg | ORAL_TABLET | Freq: Every day | ORAL | 2 refills | Status: DC

## 2020-09-01 NOTE — Progress Notes (Signed)
Office Visit    Patient Name: Kayla Farmer Date of Encounter: 09/01/2020  Primary Care Provider:  [name], MD Primary Cardiologist:  [name], MD Electrophysiologist:  None   Chief Complaint    Kayla Farmer is a 54 y.o. female with a hx of coronary artery disease s/p NSTEMI April 2020 with DES to LAD, previous tobacco use, hyperlipidemia, anxiety presents today for follow-up of coronary artery disease  Past Medical History    Past Medical History:  Diagnosis Date  . Anxiety   . Arthritis   . CAD (coronary artery disease)    a. 12/2018 NSTEMI/PCI: LM nl, LAD 30p, 74m(2.25x22 Resolute Onyx DES), LCX nl, OM2 nl, RCA 424mRPDA 100 w/ L->R collats (likely culprit - med rx). EF 50-55%.  . Claudication (HCOtero   a. 07/2019 - L>R.  . Depression   . GERD (gastroesophageal reflux disease)   . History of cervical cancer   . History of tobacco abuse    a. 30 pack year - quit 12/2018 @ time of NSTEMI.  . Obesity   . Other inflammations of eyelids   . Positive colorectal cancer screening using Cologuard test 2019  . Sinus infection    Past Surgical History:  Procedure Laterality Date  . CARDIAC CATHETERIZATION    . COLONOSCOPY WITH PROPOFOL N/A 01/24/2018   Procedure: COLONOSCOPY WITH PROPOFOL;  Surgeon: ByRobert BellowMD;  Location: [facility];  Service: Endoscopy;  Laterality: N/A;  . CORONARY STENT INTERVENTION N/A 01/07/2019   Procedure: CORONARY STENT INTERVENTION;  Surgeon: ArWellington HampshireMD;  [facility];  Service: Cardiovascular;  Laterality: N/A;  LAD  . csection x2    . DILATION AND CURETTAGE OF UTERUS    . LEFT HEART CATH AND CORONARY ANGIOGRAPHY N/A 01/07/2019   Procedure: LEFT HEART CATH AND CORONARY ANGIOGRAPHY;  Surgeon: ArWellington HampshireMD;  [facility];  Service: Cardiovascular;  Laterality: N/A;  . LIVER BIOPSY  04/26/2017  . TONSILLECTOMY      Allergies  Allergies  Allergen Reactions  . Codeine  Itching    History of Present Illness    Kayla Farmer a [age].o. female with a hx of coronary artery disease s/p NSTEMI April 2020 with DES to LAD, previous tobacco use, hyperlipidemia, anxiety  last seen 03/12/2020 by Dr. ArFletcher Anon She presented April 2020 with NSTEMI.  Cardiac catheterization with significant two-vessel coronary disease with occluded mid right PDA with left-to-right collaterals.  This was felt to be culprit but vessel was small in size and already with collaterals and thus treated medically.  Noted significant stenosis in mid LAD treated successfully PCI and DES.  LVEF 50 to 65%.  She successfully quit smoking after her MI.  Repeat echocardiogram 08/2019 LVEF 55 to 60%, no significant valvular abnormalities.  Lower extremity arterial Doppler 08/2019 normal ABI and toe pressure and normal waveforms.  She was last seen 03/12/2020.  She was noted to be overall well with the exception of having gained weight after quitting smoking.  She noted only mild exertional dyspnea.  Due to easy bruising and as she was 1 year from coronary intervention her Brilinta was discontinued and aspirin continued.  PResents today for follow up. Reports no concerns regarding her heart health. TElls me her exercise is limited by orthopedic issues. Encouraged to discuss with PCP. Endorses heart healthy diet. She is pleased that she lost 2 pounds in the last six months.  Reports no shortness of breath nor dyspnea on exertion. Reports no chest pain, pressure, or tightness. No edema, orthopnea, PND. Reports no palpitations.   EKGs/Labs/Other Studies Reviewed:   The following studies were reviewed today:   EKG:  EKG is  ordered today.  The ekg ordered today demonstrates NSR 87 bpm with no acute ST/T wave changes.   Recent Labs: 03/19/2020: ALT 18; BUN 20; Creatinine, Ser 0.91; Potassium 4.1; Sodium 138; TSH 1.93 05/26/2020: Hemoglobin 11.1; Platelets 242.0  Recent Lipid Panel    Component Value Date/Time    CHOL 145 03/19/2020 0920   TRIG 153.0 (H) 03/19/2020 0920   HDL 45.10 03/19/2020 0920   CHOLHDL 3 03/19/2020 0920   VLDL 30.6 03/19/2020 0920   LDLCALC 70 03/19/2020 0920   LDLDIRECT 157.0 11/10/2016 1028   Home Medications   Current Meds  Medication Sig  . ACCU-CHEK GUIDE test strip   . Accu-Chek Softclix Lancets lancets SMARTSIG:Topical  . aspirin EC 81 MG EC tablet Take 1 tablet (81 mg total) by mouth daily.  Marland Kitchen atorvastatin (LIPITOR) 80 MG tablet TAKE 1 TABLET BY MOUTH DAILY AT 6 PM  . blood glucose meter kit and supplies Use to check blood sugars up to two times daily. Dispense based on patient and insurance preference.  . busPIRone (BUSPAR) 30 MG tablet Take 1 tablet (30 mg total) by mouth 3 (three) times daily. As needed for anxiety  . itraconazole (SPORANOX) 100 MG capsule Take 1 capsule (100 mg total) by mouth 2 (two) times daily.  . metoprolol succinate (TOPROL-XL) 25 MG 24 hr tablet TAKE 1 TABLET(25 MG) BY MOUTH DAILY  . omeprazole (PRILOSEC) 40 MG capsule Take 1 capsule (40 mg total) by mouth daily.  Marland Kitchen venlafaxine XR (EFFEXOR-XR) 75 MG 24 hr capsule TAKE 1 CAPSULE BY MOUTH EVERY DAY     Review of Systems  All other systems reviewed and are otherwise negative except as noted above.  Physical Exam    VS:  BP 110/70 (BP Location: Left Arm, Patient Position: Sitting, Cuff Size: Large)   Pulse 87   Ht 5' 3"  (1.6 m)   Wt 203 lb (92.1 kg)   LMP 10/13/2014 (Approximate)   SpO2 96%   BMI 35.96 kg/m  , BMI Body mass index is 35.96 kg/m.  Wt Readings from Last 3 Encounters:  09/01/20 203 lb (92.1 kg)  04/27/20 202 lb (91.6 kg)  04/06/20 202 lb (91.6 kg)    GEN: Well nourished, overweight, well developed, in no acute distress. HEENT: normal. Neck: Supple, no JVD, carotid bruits, or masses. Cardiac: RRR, no murmurs, rubs, or gallops. No clubbing, cyanosis, edema.  Radials/DP/PT 2+ and equal bilaterally.  Respiratory:  Respirations regular and unlabored, clear to  auscultation bilaterally. GI: Soft, nontender, nondistended. MS: No deformity or atrophy. Skin: Warm and dry, no rash. Neuro:  Strength and sensation are intact. Psych: Normal affect.  Assessment & Plan    1. Coronary artery disease- Stable with no anginal symptoms. No indication for ischemic evaluation. EKG today NSR. GDMT includes aspirin, beta blocker, statin. Refills provided.   2. HLD, LDL goal less than 70- Lipid panel 03/19/20 with LDL 70. Continue Atorvastatin 38m daily.    Disposition: Follow up in 6 month(s) with Dr. AFletcher Anonor APP  Signed, CLoel Dubonnet NP 09/01/2020, 3:04 PM [place]

## 2020-09-01 NOTE — Patient Instructions (Signed)
Medication Instructions:  No medication changes today.   *If you need a refill on your cardiac medications before your next appointment, please call your pharmacy*  Lab Work: None ordered today.  Testing/Procedures: Your EKG today showed normal sinus rhythm which is a great result.   Follow-Up: At [facility], you and your health needs are our priority.  As part of our continuing mission to provide you with exceptional heart care, we have created designated Provider Care Teams.  These Care Teams include your primary Cardiologist (physician) and Advanced Practice Providers (APPs -  Physician Assistants and Nurse Practitioners) who all work together to provide you with the care you need, when you need it.  We recommend signing up for the patient portal called "MyChart".  Sign up information is provided on this After Visit Summary.  MyChart is used to connect with patients for Virtual Visits (Telemedicine).  Patients are able to view lab/test results, encounter notes, upcoming appointments, etc.  Non-urgent messages can be sent to your provider as well.   To learn more about what you can do with MyChart, go to [email or web address].    Your next appointment:   6 month(s)  The format for your next appointment:   In Person  Provider:   You may see [name], MD or one of the following Advanced Practice Providers on your designated Care Team:    Murray Hodgkins, NP  Christell Faith, PA-C  Marrianne Mood, PA-C  Cadence Kathlen Mody, [place]  Laurann Montana, NP  Other Instructions  Exercise recommendations: The American Heart Association recommends 150 minutes of moderate intensity exercise weekly. Try 30 minutes of moderate intensity exercise 4-5 times per week. This could include walking, jogging, or swimming.  Heart Healthy Diet Recommendations: A low-salt diet is recommended. Meats should be grilled, baked, or boiled. Avoid fried foods. Focus on lean protein sources like fish or  chicken with vegetables and fruits. The American Heart Association is a Microbiologist!  American Heart Association Diet and Lifeystyle Recommendations

## 2020-09-07 ENCOUNTER — Telehealth: Payer: Self-pay | Admitting: Internal Medicine

## 2020-09-07 MED ORDER — OMEPRAZOLE 40 MG PO CPDR
40.0000 mg | DELAYED_RELEASE_CAPSULE | Freq: Every day | ORAL | 1 refills | Status: DC
Start: 2020-09-07 — End: 2020-12-21

## 2020-09-07 NOTE — Telephone Encounter (Signed)
Patient called in need refill foromeprazole (PRILOSEC) 40 MG capsule

## 2020-09-21 ENCOUNTER — Telehealth (INDEPENDENT_AMBULATORY_CARE_PROVIDER_SITE_OTHER): Payer: Medicare Other | Admitting: Internal Medicine

## 2020-09-21 ENCOUNTER — Encounter: Payer: Self-pay | Admitting: Internal Medicine

## 2020-09-21 ENCOUNTER — Telehealth: Payer: Self-pay

## 2020-09-21 VITALS — Ht 62.99 in

## 2020-09-21 DIAGNOSIS — B351 Tinea unguium: Secondary | ICD-10-CM | POA: Diagnosis not present

## 2020-09-21 DIAGNOSIS — I25110 Atherosclerotic heart disease of native coronary artery with unstable angina pectoris: Secondary | ICD-10-CM

## 2020-09-21 DIAGNOSIS — H543 Unqualified visual loss, both eyes: Secondary | ICD-10-CM

## 2020-09-21 DIAGNOSIS — E1169 Type 2 diabetes mellitus with other specified complication: Secondary | ICD-10-CM

## 2020-09-21 DIAGNOSIS — K760 Fatty (change of) liver, not elsewhere classified: Secondary | ICD-10-CM | POA: Diagnosis not present

## 2020-09-21 DIAGNOSIS — E785 Hyperlipidemia, unspecified: Secondary | ICD-10-CM | POA: Diagnosis not present

## 2020-09-21 DIAGNOSIS — D649 Anemia, unspecified: Secondary | ICD-10-CM | POA: Diagnosis not present

## 2020-09-21 DIAGNOSIS — F411 Generalized anxiety disorder: Secondary | ICD-10-CM

## 2020-09-21 DIAGNOSIS — E669 Obesity, unspecified: Secondary | ICD-10-CM

## 2020-09-21 LAB — HM DIABETES EYE EXAM

## 2020-09-21 MED ORDER — EFINACONAZOLE 10 % EX SOLN: CUTANEOUS | 2 refills | Status: DC

## 2020-09-21 NOTE — Assessment & Plan Note (Signed)
I have addressed  BMI and recommended wt loss of 10% of body weigh over the next 6 months using a low glycemic index diet and regular exercise a minimum of 5 days per week.   

## 2020-09-21 NOTE — Patient Instructions (Addendum)
PLEASE SCHEDULE A LAB VISIT THIS MONTH  WE NEED TO CHECK YOUR STOOLS FOR BLOOD TO FIGURE OUT WHY YOU ARE ANEMIC.  YOU WILL BE GIVEN TAKE HOME CARDS AT YOUR VISIT . PLEASE ASK FOR THEM IF YOU DO NOT RECEIVE THEM AT YOUR LAB VISIT

## 2020-09-21 NOTE — Progress Notes (Addendum)
Telephone  Note  This visit type was conducted due to national recommendations for restrictions regarding the COVID-19 pandemic (e.g. social distancing).  This format is felt to be most appropriate for this patient at this time.  All issues noted in this document were discussed and addressed.  No physical exam was performed (except for noted visual exam findings with Video Visits).   I connected with@ on 09/21/20 at  9:00 AM EST by  telephone and verified that I am speaking with the correct person using two identifiers. Location patient: home Location provider: work or home office Persons participating in the virtual visit: patient, provider  I discussed the limitations, risks, security and privacy concerns of performing an evaluation and management service by telephone and the availability of in person appointments. I also discussed with the patient that there may be a patient responsible charge related to this service. The patient expressed understanding and agreed to proceed.   Reason for visit: follow up   HPI:  Saw cardiology  In December,  No changes made to regimen but advised not to start the itraconazole prescribed by Dr Milinda Pointer  For toenail fungus in September due to concurrent use of atorvastatin   Onychomycosis :  3rd toenail  Anxiety : using effexor and buspirone  Diabetes: had eye exam at [facility] visit this summer.  Taking an oral supplement (preservision and AREDs).   Fasting sugars 99   PAD: denies leg pain, claudication symptoms  CAD :  Has had cardiology follow up,  Remains asymptomatic   ROS: See pertinent positives and negatives per HPI.  Past Medical History:  Diagnosis Date  . Anxiety   . Arthritis   . CAD (coronary artery disease)    a. 12/2018 NSTEMI/PCI: LM nl, LAD 30p, 2m(2.25x22 Resolute Onyx DES), LCX nl, OM2 nl, RCA 465mRPDA 100 w/ L->R collats (likely culprit - med rx). EF 50-55%.  . Claudication ([place]   a. 07/2019 - L>R.  . Depression   . GERD  (gastroesophageal reflux disease)   . History of cervical cancer   . History of tobacco abuse    a. 30 pack year - quit 12/2018 @ time of NSTEMI.  . Obesity   . Other inflammations of eyelids   . Positive colorectal cancer screening using Cologuard test 2019  . Sinus infection     Past Surgical History:  Procedure Laterality Date  . CARDIAC CATHETERIZATION    . COLONOSCOPY WITH PROPOFOL N/A 01/24/2018   Procedure: COLONOSCOPY WITH PROPOFOL;  Surgeon: ByRobert BellowMD;  Location: [facility];  Service: Endoscopy;  Laterality: N/A;  . CORONARY STENT INTERVENTION N/A 01/07/2019   Procedure: CORONARY STENT INTERVENTION;  Surgeon: ArWellington HampshireMD;  [facility] [facility];  Service: Cardiovascular;  Laterality: N/A;  LAD  . csection x2    . DILATION AND CURETTAGE OF UTERUS    . LEFT HEART CATH AND CORONARY ANGIOGRAPHY N/A 01/07/2019   Procedure: LEFT HEART CATH AND CORONARY ANGIOGRAPHY;  Surgeon: ArWellington HampshireMD;  [facility];  Service: Cardiovascular;  Laterality: N/A;  . LIVER BIOPSY  04/26/2017  . TONSILLECTOMY      Family History  Problem Relation Age of Onset  . Coronary artery disease Mother   . Coronary artery disease Father        smoker  . Breast cancer Neg Hx   . Colon cancer Neg Hx    SOCIAL HX:  reports that she quit smoking about  20 months ago. Her smoking use included cigarettes. She has a 30.00 pack-year smoking history. She has never used smokeless tobacco. She reports that she does not drink alcohol and does not use drugs.   Current Outpatient Medications:  .  ACCU-CHEK GUIDE test strip, , Disp: , Rfl:  .  Accu-Chek Softclix Lancets lancets, SMARTSIG:Topical, Disp: , Rfl:  .  aspirin EC 81 MG EC tablet, Take 1 tablet (81 mg total) by mouth daily., Disp: 30 tablet, Rfl: 0 .  atorvastatin (LIPITOR) 80 MG tablet, TAKE 1 TABLET BY MOUTH DAILY AT 6 PM, Disp: 90 tablet, Rfl: 2 .  blood glucose meter kit and supplies, Use to  check blood sugars up to two times daily. Dispense based on patient and insurance preference., Disp: 1 each, Rfl: 0 .  busPIRone (BUSPAR) 30 MG tablet, Take 1 tablet (30 mg total) by mouth 3 (three) times daily. As needed for anxiety, Disp: 90 tablet, Rfl: 2 .  Efinaconazole 10 % SOLN, Apply once dial to affected toenail, Disp: 4 mL, Rfl: 2 .  metoprolol succinate (TOPROL-XL) 25 MG 24 hr tablet, Take 1 tablet (25 mg total) by mouth daily., Disp: 90 tablet, Rfl: 2 .  omeprazole (PRILOSEC) 40 MG capsule, Take 1 capsule (40 mg total) by mouth daily., Disp: 90 capsule, Rfl: 1 .  venlafaxine XR (EFFEXOR-XR) 75 MG 24 hr capsule, TAKE 1 CAPSULE BY MOUTH EVERY DAY, Disp: 90 capsule, Rfl: 1  EXAM:   General impression: alert, cooperative and articulate.  No signs of being in distress  Lungs: speech is fluent sentence length suggests that patient is not short of breath and not punctuated by cough, sneezing or sniffing. Marland Kitchen   Psych: affect normal.  speech is articulate and non pressured .  Denies suicidal thoughts    ASSESSMENT AND PLAN:  Discussed the following assessment and plan:  Hepatic steatosis - Plan: Comprehensive metabolic panel  Anemia, unspecified type - Plan: CBC with Differential/Platelet, Fecal occult blood, imunochemical  Hyperlipidemia LDL goal <70 - Plan: Lipid panel  Type 2 diabetes mellitus with obesity ([place]) - Plan: Hemoglobin A1c  Onychomycosis of toenail  Obesity (BMI 30.0-34.9)  Decreased vision in both eyes  Generalized anxiety disorder  Atherosclerosis of native coronary artery of native heart with unstable angina pectoris ([place])  Onychomycosis of toenail Patient never started oral itraconazole prescribed in September and was advised by cardiology not to use it, which I agree, given her history of fatty liver and need to continue atorvastatin.  OTC methods have not helped .  Trial of topical antifungal.   Obesity (BMI 30.0-34.9) I have addressed  BMI and  recommended wt loss of 10% of body weigh over the next 6 months using a low glycemic index diet and regular exercise a minimum of 5 days per week.    Anemia Mild .  etiology unclear:  Iron , thyroid , folate and B12 studies were normal. Will check FOBT with next lab visit   Lab Results  Component Value Date   WBC 8.6 05/26/2020   HGB 11.1 (L) 05/26/2020   HCT 34.1 (L) 05/26/2020   MCV 80.1 05/26/2020   PLT 242.0 05/26/2020     Lab Results  Component Value Date   VITAMINB12 615 04/06/2020   No results found for: FOLATE Lab Results  Component Value Date   TSH 1.93 03/19/2020     Decreased vision in both eyes She has had a formal eye exam and was told that she had developed presbyopia  and to use readers.  She is now receiving care by [facility], .   Records requested  Generalized anxiety disorder Improved with use of Effexor once daily and buspirone twice daily   CAD (coronary atherosclerotic disease) S/p NSTEMI Apri 27 2020 S/o DES mid LAD 85 % stenosis resolved 100% occlusion RDPA 40% stenosis RCA   She remains asymptomatic and adherent to her drug regimen.  She has had  Month follow up with cardiology    I discussed the assessment and treatment plan with the patient. The patient was provided an opportunity to ask questions and all were answered. The patient agreed with the plan and demonstrated an understanding of the instructions.   The patient was advised to call back or seek an in-person evaluation if the symptoms worsen or if the condition fails to improve as anticipated.  I provided 30 minutes of non-face-to-face time during this encounter.   [name], MD

## 2020-09-21 NOTE — Assessment & Plan Note (Signed)
Improved with use of Effexor once daily and buspirone twice daily

## 2020-09-21 NOTE — Assessment & Plan Note (Signed)
S/p NSTEMI Apri 27 2020 S/o DES mid LAD 85 % stenosis resolved 100% occlusion RDPA 40% stenosis RCA   She remains asymptomatic and adherent to her drug regimen.  She has had  Month follow up with cardiology

## 2020-09-21 NOTE — Assessment & Plan Note (Signed)
Patient never started oral itraconazole prescribed in September and was advised by cardiology not to use it, which I agree, given her history of fatty liver and need to continue atorvastatin.  OTC methods have not helped .  Trial of topical antifungal.

## 2020-09-21 NOTE — Assessment & Plan Note (Addendum)
Mild .  etiology unclear:  Iron , thyroid , folate and B12 studies were normal. Will check FOBT with next lab visit   Lab Results  Component Value Date   WBC 8.6 05/26/2020   HGB 11.1 (L) 05/26/2020   HCT 34.1 (L) 05/26/2020   MCV 80.1 05/26/2020   PLT 242.0 05/26/2020     Lab Results  Component Value Date   VITAMINB12 615 04/06/2020   No results found for: FOLATE Lab Results  Component Value Date   TSH 1.93 03/19/2020

## 2020-09-21 NOTE — Telephone Encounter (Signed)
Called Kayla Farmer to have eye exam faxed over. Was told by reception that Eye exam will be refaxed over today.

## 2020-09-21 NOTE — Assessment & Plan Note (Signed)
She has had a formal eye exam and was told that she had developed presbyopia and to use readers.  She is now receiving care by [facility], .   Records requested

## 2020-09-28 ENCOUNTER — Encounter: Payer: Self-pay | Admitting: Internal Medicine

## 2020-10-22 IMAGING — MG DIGITAL SCREENING BILAT W/ TOMO W/ CAD
6 of 10 series · 6 of 30 positions shown · non-contrast
Comparison: Previous exam(s).

CLINICAL DATA: Screening.

EXAM:
DIGITAL SCREENING BILATERAL MAMMOGRAM WITH TOMO AND CAD

[L MLO synth-2D (1 of 2)]
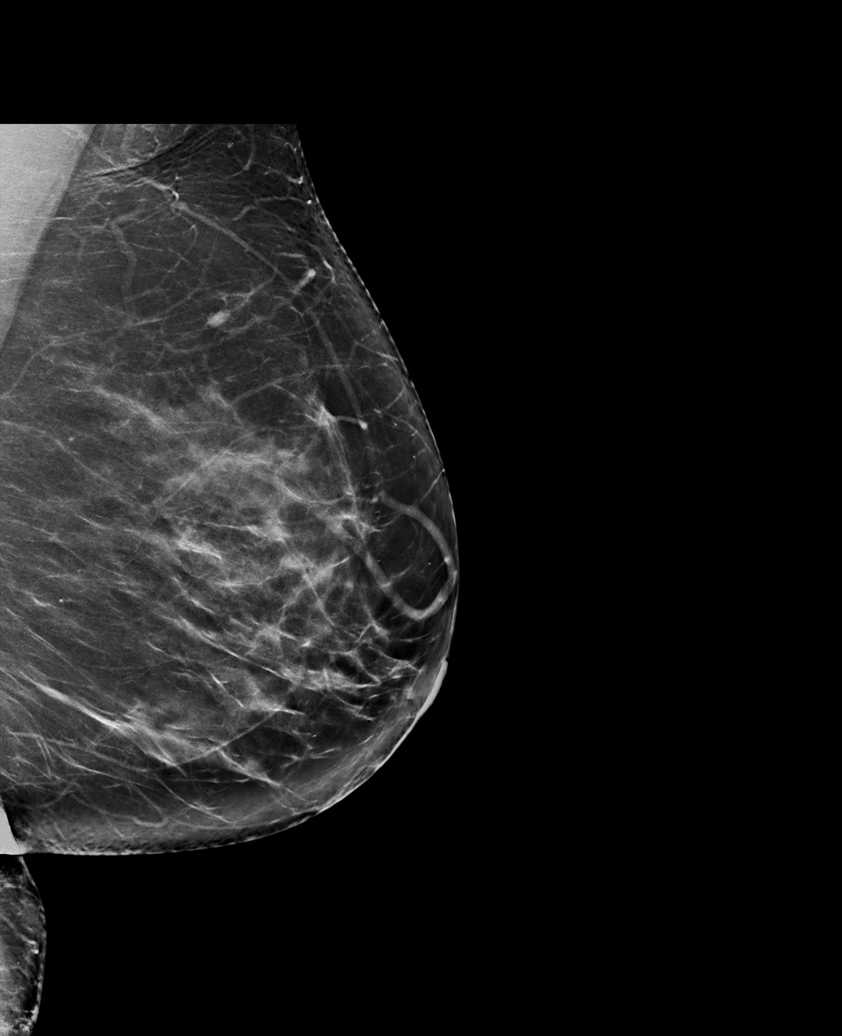

[R MLO synth-2D]
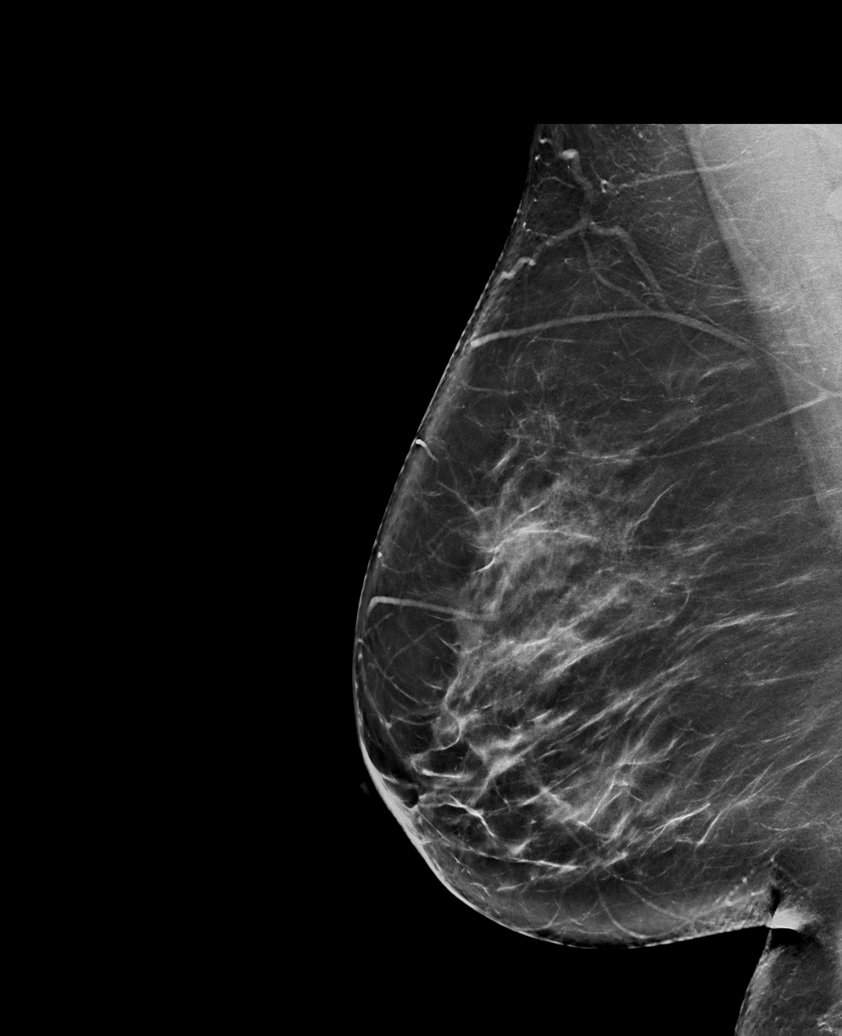

[L CC synth-2D]
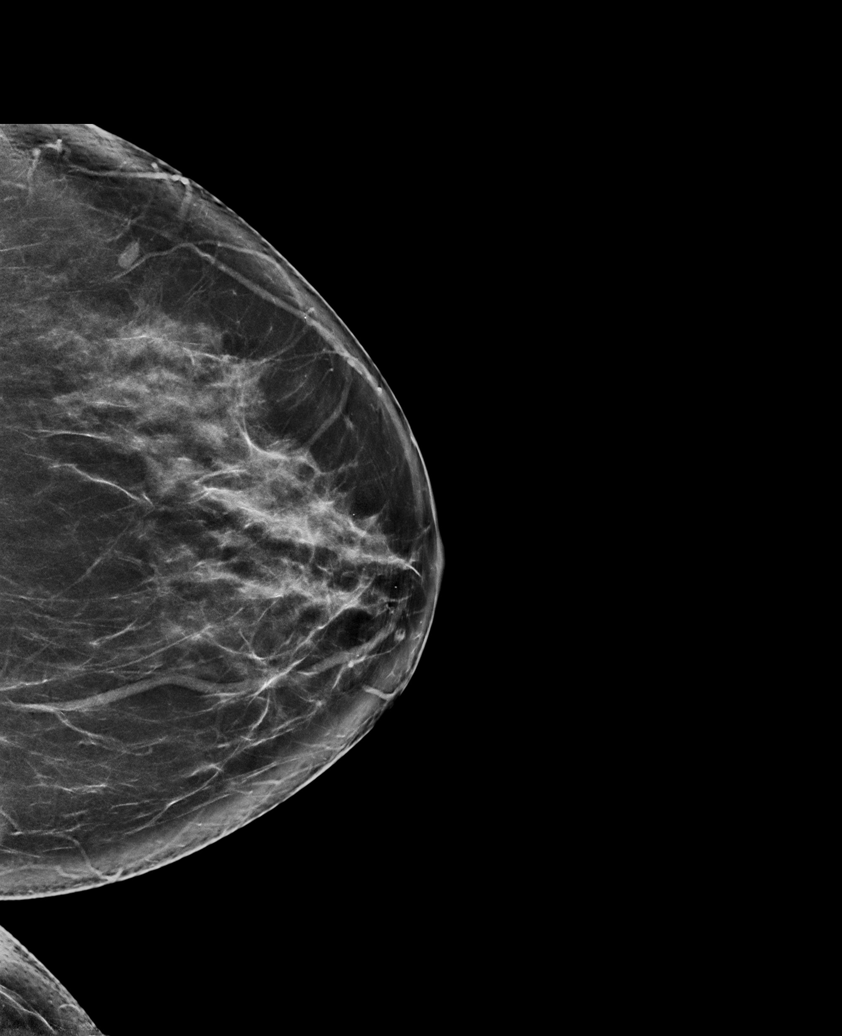

[L MLO synth-2D (2 of 2)]
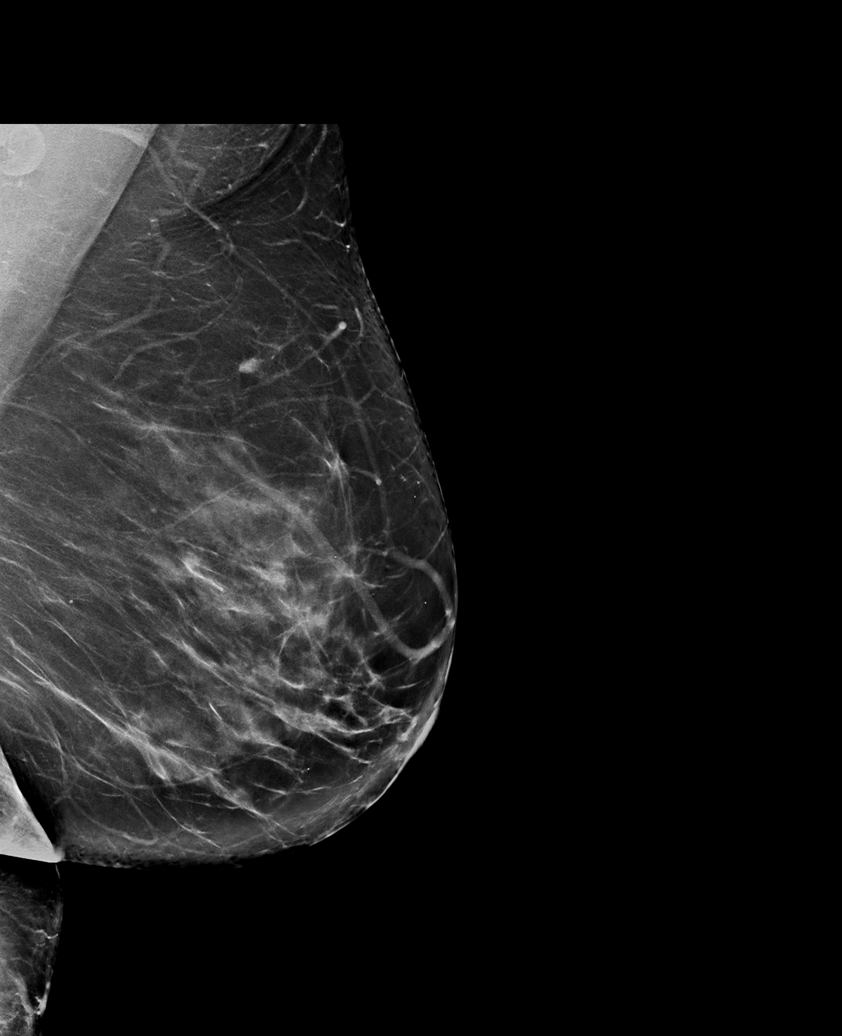

[R CC synth-2D]
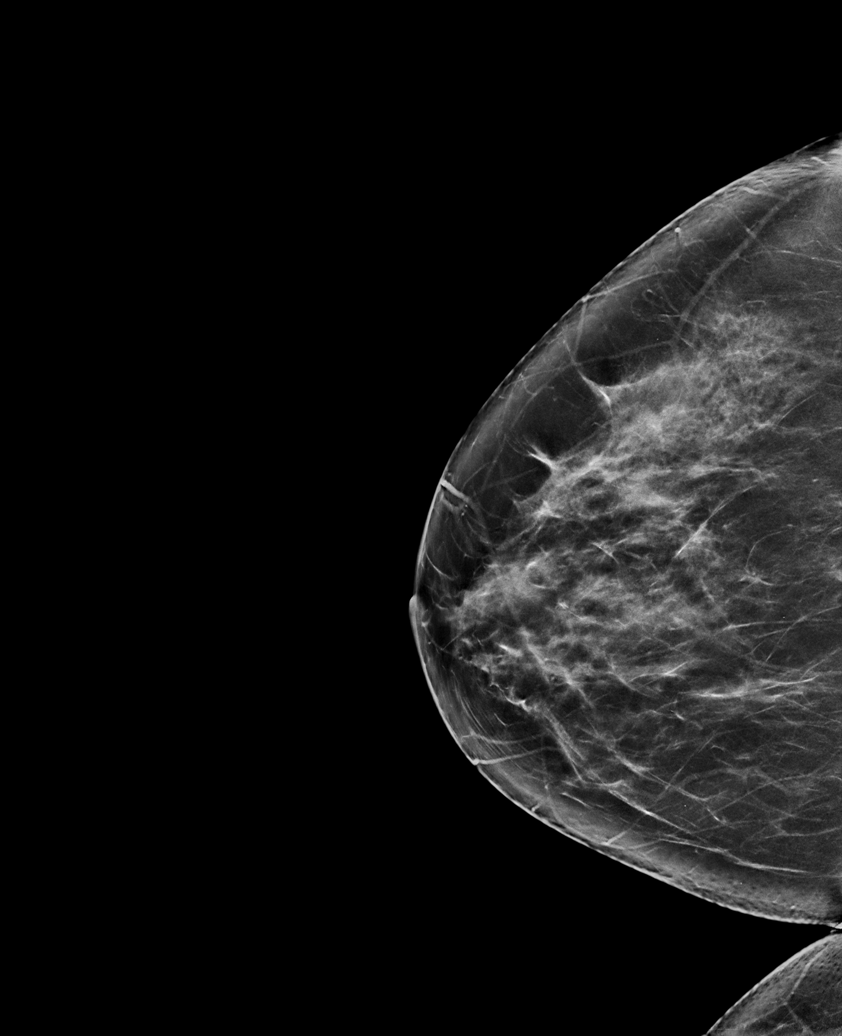

[L CC tomo · tomo slice 41/80.0]
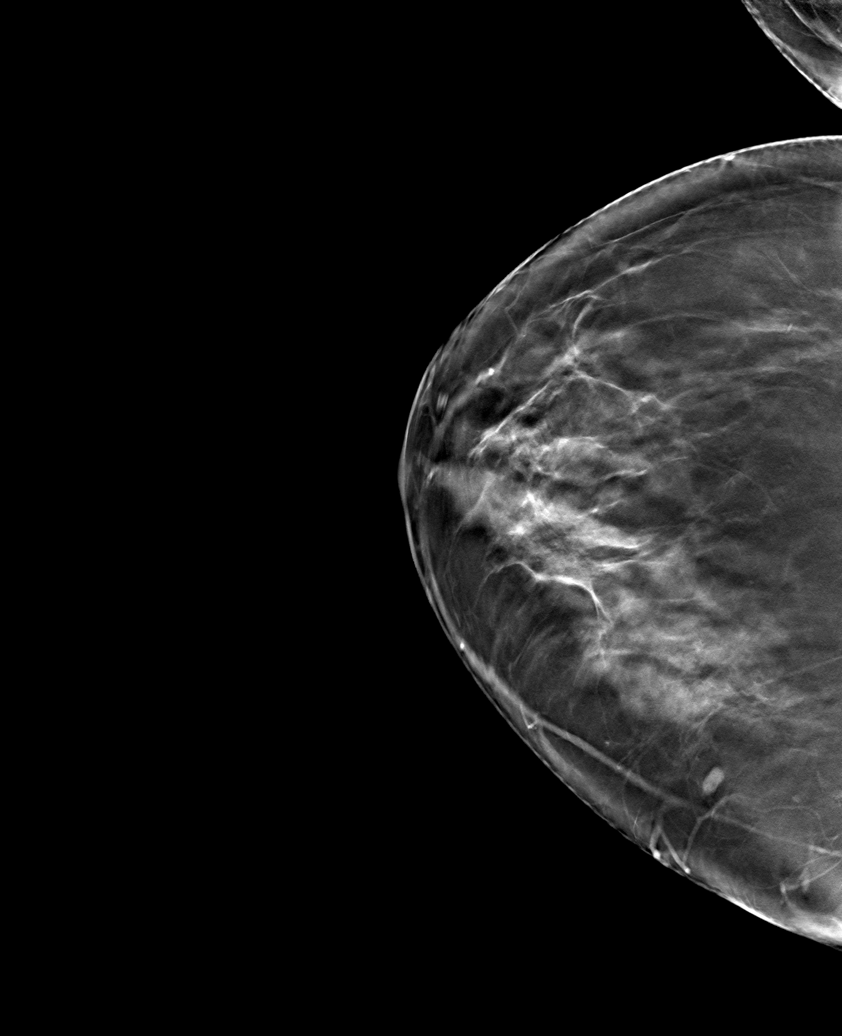

[6 of 30 positions shown; findings below may reference images not displayed]

ACR Breast Density Category c: The breast tissue is heterogeneously
dense, which may obscure small masses.
FINDINGS: There are no findings suspicious for malignancy. Images were
processed with CAD.
IMPRESSION: No mammographic evidence of malignancy. A result letter of this
screening mammogram will be mailed directly to the patient.

RECOMMENDATION:
Screening mammogram in one year. (Code:[id number])

BI-RADS CATEGORY  1: Negative.

## 2020-10-26 ENCOUNTER — Other Ambulatory Visit: Payer: Self-pay

## 2020-10-26 MED ORDER — VENLAFAXINE HCL ER 75 MG PO CP24: ORAL_CAPSULE | ORAL | 1 refills | Status: DC

## 2020-11-03 ENCOUNTER — Telehealth (INDEPENDENT_AMBULATORY_CARE_PROVIDER_SITE_OTHER): Payer: Medicare Other | Admitting: Family Medicine

## 2020-11-03 DIAGNOSIS — R0982 Postnasal drip: Secondary | ICD-10-CM

## 2020-11-03 DIAGNOSIS — R0981 Nasal congestion: Secondary | ICD-10-CM | POA: Diagnosis not present

## 2020-11-03 NOTE — Progress Notes (Signed)
Virtual Visit via Video Note  I connected with Kayla Farmer  on 11/03/20 at 11:00 AM EST by a video enabled telemedicine application and verified that I am speaking with the correct person using two identifiers.  Location patient: home, [place] Location provider:work or home office Persons participating in the virtual visit: patient, provider  I discussed the limitations of evaluation and management by telemedicine and the availability of in person appointments. The patient expressed understanding and agreed to proceed.   HPI:  Acute telemedicine visit for : -Onset: 3 days ago -Symptoms include: nasal congestion, PND, cough mild -Denies: ob fever, CP, SOB, body aches, NVD, inability to eat/drink/get out of bed, known sick contacts -Has tried: nothing -Pertinent past medical history:allergic rhinitis - always get this way this time of year; several conditions that put her at higher risk if gets covid -Pertinent medication allergies:codeine -COVID-19 vaccine status: not vaccinated  ROS: See pertinent positives and negatives per HPI.  Past Medical History:  Diagnosis Date  . Anxiety   . Arthritis   . CAD (coronary artery disease)    a. 12/2018 NSTEMI/PCI: LM nl, LAD 30p, 24m(2.25x22 Resolute Onyx DES), LCX nl, OM2 nl, RCA 431mRPDA 100 w/ L->R collats (likely culprit - med rx). EF 50-55%.  . [place]ication ([place]   a. 07/2019 - L>R.  . Depression   . GERD (gastroesophageal reflux disease)   . History of cervical cancer   . History of tobacco abuse    a. 30 pack year - quit 12/2018 @ time of NSTEMI.  . Obesity   . Other inflammations of eyelids   . Positive colorectal cancer screening using Cologuard test 2019  . Sinus infection     Past Surgical History:  Procedure Laterality Date  . CARDIAC CATHETERIZATION    . COLONOSCOPY WITH PROPOFOL N/A 01/24/2018   Procedure: COLONOSCOPY WITH PROPOFOL;  Surgeon: ByRobert BellowMD;  Location: [facility];  Service: Endoscopy;  Laterality: N/A;   . CORONARY STENT INTERVENTION N/A 01/07/2019   Procedure: CORONARY STENT INTERVENTION;  Surgeon: ArWellington HampshireMD;  [facility];  Service: Cardiovascular;  Laterality: N/A;  LAD  . csection x2    . DILATION AND CURETTAGE OF UTERUS    . LEFT HEART CATH AND CORONARY ANGIOGRAPHY N/A 01/07/2019   Procedure: LEFT HEART CATH AND CORONARY ANGIOGRAPHY;  Surgeon: ArWellington HampshireMD;  [facility] [facility];  Service: Cardiovascular;  Laterality: N/A;  . LIVER BIOPSY  04/26/2017  . TONSILLECTOMY       Current Outpatient Medications:  .  ACCU-CHEK GUIDE test strip, , Disp: , Rfl:  .  Accu-Chek Softclix Lancets lancets, SMARTSIG:Topical, Disp: , Rfl:  .  aspirin EC 81 MG EC tablet, Take 1 tablet (81 mg total) by mouth daily., Disp: 30 tablet, Rfl: 0 .  atorvastatin (LIPITOR) 80 MG tablet, TAKE 1 TABLET BY MOUTH DAILY AT 6 PM, Disp: 90 tablet, Rfl: 2 .  blood glucose meter kit and supplies, Use to check blood sugars up to two times daily. Dispense based on patient and insurance preference., Disp: 1 each, Rfl: 0 .  busPIRone (BUSPAR) 30 MG tablet, Take 1 tablet (30 mg total) by mouth 3 (three) times daily. As needed for anxiety, Disp: 90 tablet, Rfl: 2 .  Efinaconazole 10 % SOLN, Apply once dial to affected toenail, Disp: 4 mL, Rfl: 2 .  metoprolol succinate (TOPROL-XL) 25 MG 24 hr tablet, Take 1 tablet (25 mg total) by mouth daily.,  Disp: 90 tablet, Rfl: 2 .  omeprazole (PRILOSEC) 40 MG capsule, Take 1 capsule (40 mg total) by mouth daily., Disp: 90 capsule, Rfl: 1 .  venlafaxine XR (EFFEXOR-XR) 75 MG 24 hr capsule, TAKE 1 CAPSULE BY MOUTH EVERY DAY, Disp: 90 capsule, Rfl: 1  EXAM:  VITALS per patient if applicable:  GENERAL: alert, oriented, appears well and in no acute distress  HEENT: atraumatic, conjunttiva clear, no obvious abnormalities on inspection of external nose and ears  NECK: normal movements of the head and neck  LUNGS: on inspection no signs of  respiratory distress, breathing rate appears normal, no obvious gross SOB, gasping or wheezing  CV: no obvious cyanosis  MS: moves all visible extremities without noticeable abnormality  PSYCH/NEURO: pleasant and cooperative, no obvious depression or anxiety, speech and thought processing grossly intact  ASSESSMENT AND PLAN:  Discussed the following assessment and plan:  Nasal congestion  PND (post-nasal drip)  -we discussed possible serious and likely etiologies, options for evaluation and workup, limitations of telemedicine visit vs in person visit, treatment, treatment risks and precautions. Pt prefers to treat via telemedicine empirically rather than in person at this moment.  Query allergic rhinitis, viral upper respiratory illness, COVID-19 versus other.  Opted for nasal saline, allergy regimen of Allegra and Flonase, short course of nasal decongestant and other home care tips summarized in patient instructions.  Discussed Covid testing, treatment, potential complications and precautions. Work/School slipped offered:  declined Scheduled follow up with PCP offered: Agrees to schedule follow-up through her primary care office if needed. Advised to seek prompt in person care if worsening, new symptoms arise, or if is not improving with treatment. Discussed options for inperson care if PCP office not available. Did let this patient know that I only do telemedicine on Tuesdays and Thursdays for [place]. Advised to schedule follow up visit with PCP or [facility] if any further questions or concerns to avoid delays in care.   I discussed the assessment and treatment plan with the patient. The patient was provided an opportunity to ask questions and all were answered. The patient agreed with the plan and demonstrated an understanding of the instructions.     Lucretia Kern, DO

## 2020-11-03 NOTE — Patient Instructions (Addendum)
  HOME CARE TIPS:  -[place] testing information: https://www.[facility].org/ OR [phone] Most pharmacies also offer testing and home test kits. IF the covid test is positive please schedule a prompt follow up video visit to discuss treatment options for COVID. Most treatments need to be given within 5-7 days of symptoms starting to be the most useful.    -can use tylenol  if needed for fevers, aches and pains per instructions  -can use nasal saline a few times per day if you have nasal congestion; sometimes  a short course of Afrin nasal spray for 3 days can help with symptoms as well  -take your allergy regimen of flonase and allegra once daily  -stay hydrated, drink plenty of fluids and eat small healthy meals - avoid dairy  -can take 1000 IU (63mg) Vit D3 and 100-500 mg of Vit C daily per instructions  -If the Covid test is positive, check out the CDC website for more information on home care, transmission and treatment for COVID19  -follow up with your doctor in 2-3 days unless improving and feeling better  -stay home while sick, except to seek medical care, and if you have CPinebluffideally it would be best to stay home for a full 10 days since the onset of symptoms PLUS one day of no fever and feeling better. Wear a good mask (such as N95 or KN95) if around others to reduce the risk of transmission.  It was nice to meet you today, and I really hope you are feeling better soon. I help [facility] out with telemedicine visits on Tuesdays and Thursdays and am available for visits on those days. If you have any concerns or questions following this visit please schedule a follow up visit with your Primary Care doctor or seek care at a local urgent care clinic to avoid delays in care.    Seek in person care or schedule a follow up video visit promptly if your symptoms worsen, new concerns arise or you are not improving with treatment. Call 911  and/or seek emergency care if your symptoms are severe or life threatening.

## 2020-12-16 ENCOUNTER — Other Ambulatory Visit: Payer: Self-pay

## 2020-12-16 DIAGNOSIS — Z9889 Other specified postprocedural states: Secondary | ICD-10-CM

## 2020-12-21 ENCOUNTER — Other Ambulatory Visit: Payer: Self-pay | Admitting: Internal Medicine

## 2020-12-21 ENCOUNTER — Other Ambulatory Visit: Payer: Self-pay | Admitting: Family

## 2020-12-21 DIAGNOSIS — E785 Hyperlipidemia, unspecified: Secondary | ICD-10-CM

## 2020-12-21 DIAGNOSIS — I25118 Atherosclerotic heart disease of native coronary artery with other forms of angina pectoris: Secondary | ICD-10-CM

## 2020-12-21 NOTE — Telephone Encounter (Signed)
Rx request sent to pharmacy.  

## 2021-01-18 ENCOUNTER — Other Ambulatory Visit: Payer: Self-pay

## 2021-01-18 MED ORDER — BUSPIRONE HCL 30 MG PO TABS: 30.0000 mg | ORAL_TABLET | Freq: Three times a day (TID) | ORAL | 0 refills | Status: DC

## 2021-01-19 ENCOUNTER — Telehealth: Payer: Self-pay | Admitting: Internal Medicine

## 2021-01-19 NOTE — Telephone Encounter (Signed)
No I cannot

## 2021-01-19 NOTE — Telephone Encounter (Signed)
PT called in wanting to know if there was anything Dr.Tullo could do to help her with getting out of Jury Duty due to health issues she has had.

## 2021-01-20 ENCOUNTER — Telehealth: Payer: Self-pay | Admitting: Cardiovascular Disease

## 2021-01-20 NOTE — Telephone Encounter (Signed)
Pt is aware.  

## 2021-01-20 NOTE — Telephone Encounter (Signed)
Patient would like to know if she can have a note  Written to excuse her from jury duty due to her heart condition.

## 2021-01-24 NOTE — Telephone Encounter (Signed)
Her cardiac condition has been stable and thus I am not able to provide her with such a note.

## 2021-01-25 NOTE — Telephone Encounter (Signed)
Patient made aware of Dr. Tyrell Antonio response with verbalized understanding.

## 2021-02-22 ENCOUNTER — Other Ambulatory Visit: Payer: Self-pay | Admitting: Internal Medicine

## 2021-02-25 ENCOUNTER — Encounter: Payer: Self-pay | Admitting: Cardiovascular Disease

## 2021-02-25 ENCOUNTER — Other Ambulatory Visit: Payer: Self-pay

## 2021-02-25 ENCOUNTER — Ambulatory Visit (INDEPENDENT_AMBULATORY_CARE_PROVIDER_SITE_OTHER): Payer: Medicare Other | Admitting: Cardiovascular Disease

## 2021-02-25 VITALS — BP 128/80 | HR 82 | Ht 63.0 in | Wt 202.5 lb

## 2021-02-25 DIAGNOSIS — Z87891 Personal history of nicotine dependence: Secondary | ICD-10-CM

## 2021-02-25 DIAGNOSIS — I2510 Atherosclerotic heart disease of native coronary artery without angina pectoris: Secondary | ICD-10-CM

## 2021-02-25 DIAGNOSIS — E785 Hyperlipidemia, unspecified: Secondary | ICD-10-CM | POA: Diagnosis not present

## 2021-02-25 NOTE — Progress Notes (Signed)
Cardiology Office Note   Date:  02/25/2021   ID:  Kayla Farmer, DOB [birth year], MRN [id number]  PCP:  [name], MD  Cardiologist:   [name], MD   Chief Complaint  Patient presents with   Other    6 Month f/u c/o right arm giving out and feet/leg numbness/stiffness. Meds reviewed verbally with pt.       History of Present Illness: Kayla Farmer is a 55 y.o. female who presents for a follow-up visit regarding coronary artery disease. She has known history of tobacco use, hyperlipidemia, anxiety and family history of coronary artery disease.  She presented in April 2020 with non-ST elevation myocardial infarction.  Cardiac catheterization showed significant two-vessel coronary artery disease with an occluded mid right PDA with left-to-right collaterals.  This was felt to be the culprit but the vessel was small in size and already had collaterals and thus left to be treated medically.  There was significant stenosis in the mid LAD which was treated successfully with PCI and drug-eluting stent placement.  Ejection fraction was 50 to 55%. She quit smoking after her myocardial infarction.  Repeat echocardiogram in December 2020 showed normal LV systolic function with an EF of 55 to 60% with no significant valvular abnormalities. Lower extremity arterial Doppler in December 2020 showed normal ABI and toe pressure and normal waveforms. She gained 20 pounds after she quit smoking in 2020.  She has not gained any more Brilinta was discontinued during last visit.  She has been doing well without chest pain or shortness of breath.  She continues to complain of pain in the ankles as well as the right arm.  Pulses are normal throughout.   Past Medical History:  Diagnosis Date   Anxiety    Arthritis    CAD (coronary artery disease)    a. 12/2018 NSTEMI/PCI: LM nl, LAD 30p, 84m(2.25x22 Resolute Onyx DES), LCX nl, OM2 nl, RCA 411mRPDA 100 w/ L->R collats (likely culprit - med rx). EF  50-55%.   [facility]   a. 07/2019 - L>R.   Depression    GERD (gastroesophageal reflux disease)    History of cervical cancer    History of tobacco abuse    a. 30 pack year - quit 12/2018 @ time of NSTEMI.   Obesity    Other inflammations of eyelids    Positive colorectal cancer screening using Cologuard test 2019   Sinus infection     Past Surgical History:  Procedure Laterality Date   CARDIAC CATHETERIZATION     COLONOSCOPY WITH PROPOFOL N/A 01/24/2018   Procedure: COLONOSCOPY WITH PROPOFOL;  Surgeon: ByRobert BellowMD;  Location: [facility];  Service: Endoscopy;  Laterality: N/A;   CORONARY STENT INTERVENTION N/A 01/07/2019   Procedure: CORONARY STENT INTERVENTION;  Surgeon: ArWellington HampshireMD;  [facility];  Service: Cardiovascular;  Laterality: N/A;  LAD   csection x2     DILATION AND CURETTAGE OF UTERUS     LEFT HEART CATH AND CORONARY ANGIOGRAPHY N/A 01/07/2019   Procedure: LEFT HEART CATH AND CORONARY ANGIOGRAPHY;  Surgeon: ArWellington HampshireMD;  [facility];  Service: Cardiovascular;  Laterality: N/A;   LIVER BIOPSY  04/26/2017   TONSILLECTOMY       Current Outpatient Medications  Medication Sig Dispense Refill   ACCU-CHEK GUIDE test strip      Accu-Chek Softclix Lancets lancets SMARTSIG:Topical     aspirin EC 81 MG EC  tablet Take 1 tablet (81 mg total) by mouth daily. 30 tablet 0   atorvastatin (LIPITOR) 80 MG tablet TAKE 1 TABLET BY MOUTH DAILY AT 6 PM 90 tablet 2   blood glucose meter kit and supplies Use to check blood sugars up to two times daily. Dispense based on patient and insurance preference. 1 each 0   busPIRone (BUSPAR) 30 MG tablet TAKE 1 TABLET(30 MG) BY MOUTH THREE TIMES DAILY AS NEEDED FOR ANXIETY 90 tablet 0   clindamycin (CLEOCIN) 150 MG capsule Take 150 mg by mouth as needed.     Efinaconazole 10 % SOLN Apply once dial to affected toenail 4 mL 2   metoprolol succinate (TOPROL-XL) 25 MG 24 hr  tablet TAKE 1 TABLET(25 MG) BY MOUTH DAILY 90 tablet 1   Multiple Vitamins-Minerals (PRESERVISION AREDS 2 PO) Take 1 capsule by mouth in the morning and at bedtime.     omeprazole (PRILOSEC) 40 MG capsule TAKE 1 CAPSULE(40 MG) BY MOUTH DAILY 90 capsule 1   venlafaxine XR (EFFEXOR-XR) 75 MG 24 hr capsule TAKE 1 CAPSULE BY MOUTH EVERY DAY 90 capsule 1   No current facility-administered medications for this visit.    Allergies:   Codeine    Social History:  The patient  reports that she quit smoking about 2 years ago. Her smoking use included cigarettes. She has a 30.00 pack-year smoking history. She has never used smokeless tobacco. She reports that she does not drink alcohol and does not use drugs.   Family History:  The patient's family history includes Coronary artery disease in her father and mother.    ROS:  Please see the history of present illness.   Otherwise, review of systems are positive for none.   All other systems are reviewed and negative.    PHYSICAL EXAM: VS:  BP 128/80 (BP Location: Left Arm, Patient Position: Sitting, Cuff Size: Normal)   Pulse 82   Ht _0  (1.6 m)   Wt 202 lb 8 oz (91.9 kg)   LMP 10/13/2014 (Approximate)   SpO2 98%   BMI 35.87 kg/m  , BMI Body mass index is 35.87 kg/m. GEN: Well nourished, well developed, in no acute distress  HEENT: normal  Neck: no JVD, carotid bruits, or masses Cardiac: RRR; no murmurs, rubs, or gallops,no edema  Respiratory:  clear to auscultation bilaterally, normal work of breathing GI: soft, nontender, nondistended, + BS MS: no deformity or atrophy  Skin: warm and dry, no rash Neuro:  Strength and sensation are intact Psych: euthymic mood, full affect   EKG:  EKG is ordered today. EKG showed normal sinus rhythm with no significant ST or T wave changes.   Recent Labs: 03/19/2020: ALT 18; BUN 20; Creatinine, Ser 0.91; Potassium 4.1; Sodium 138; TSH 1.93 05/26/2020: Hemoglobin 11.1; Platelets 242.0    Lipid  Panel    Component Value Date/Time   CHOL 145 03/19/2020 0920   TRIG 153.0 (H) 03/19/2020 0920   HDL 45.10 03/19/2020 0920   CHOLHDL 3 03/19/2020 0920   VLDL 30.6 03/19/2020 0920   LDLCALC 70 03/19/2020 0920   LDLDIRECT 157.0 11/10/2016 1028      Wt Readings from Last 3 Encounters:  02/25/21 202 lb 8 oz (91.9 kg)  09/01/20 203 lb (92.1 kg)  04/27/20 202 lb (91.6 kg)       No flowsheet data found.    ASSESSMENT AND PLAN:  1.  Coronary artery disease involving native coronary arteries without angina: She is doing well  overall with no symptoms of angina.   I discussed with her the importance of starting an exercise program and following her heart healthy diet. Continue aspirin indefinitely.  2.  Hyperlipidemia: Significant improvement in lipid profile on high-dose atorvastatin.  Most recent lipid profile showed an LDL of 70 and triglyceride of 153.  3.  Atypical leg pain: No evidence of peripheral arterial disease on ABI.  Distal pulses are normal.  In addition, she complains of right arm discomfort but also her pulses in the upper arm are normal.  I advised her to follow-up with her primary care physician about the symptoms.   Disposition:   FU with me in 6 months  Signed,  [name], MD  02/25/2021 9:04 AM    [place]

## 2021-02-25 NOTE — Patient Instructions (Signed)
Medication Instructions:  Please continue your current medications.   *If you need a refill on your cardiac medications before your next appointment, please call your pharmacy*   Lab Work: None  Testing/Procedures: None  Follow-Up: At [facility], you and your health needs are our priority.  As part of our continuing mission to provide you with exceptional heart care, we have created designated Provider Care Teams.  These Care Teams include your primary Cardiologist (physician) and Advanced Practice Providers (APPs -  Physician Assistants and Nurse Practitioners) who all work together to provide you with the care you need, when you need it.  We recommend signing up for the patient portal called "MyChart".  Sign up information is provided on this After Visit Summary.  MyChart is used to connect with patients for Virtual Visits (Telemedicine).  Patients are able to view lab/test results, encounter notes, upcoming appointments, etc.  Non-urgent messages can be sent to your provider as well.   To learn more about what you can do with MyChart, go to [email or web address].    Your next appointment:   6 month(s)  The format for your next appointment:   In Person  Provider:   You may see [name], MD or one of the following Advanced Practice Providers on your designated Care Team:   Murray Hodgkins, NP Christell Faith, PA-C Marrianne Mood, PA-C Cadence Langeloth, [place]

## 2021-03-19 ENCOUNTER — Other Ambulatory Visit: Payer: Medicare Other

## 2021-03-23 ENCOUNTER — Telehealth: Payer: Self-pay

## 2021-03-23 NOTE — Telephone Encounter (Signed)
Refilled: 09/21/2020 Last OV: 09/21/2020 Next OV: 04/16/2021

## 2021-03-23 NOTE — Telephone Encounter (Signed)
REfill denied.  She needs to be seen.  She needs to have the labs done that I ordered 6 months gao PRIOR TO THE VISIT

## 2021-03-24 NOTE — Telephone Encounter (Signed)
PT called to advise that she was expecting a call on Friday last week but never got it. She is now returning a call from [place].

## 2021-03-24 NOTE — Telephone Encounter (Signed)
Spoke with pt and she stated that someone called her and told her that her appt last Friday was a virtual visit. I explained to pt that her appt on 03/19/2021 was for lab work. Pt was confused so she has been rescheduled for blood work on Friday 03/26/2021.

## 2021-03-24 NOTE — Telephone Encounter (Signed)
LMTCB

## 2021-03-26 ENCOUNTER — Other Ambulatory Visit: Payer: Medicare Other

## 2021-03-30 ENCOUNTER — Other Ambulatory Visit (INDEPENDENT_AMBULATORY_CARE_PROVIDER_SITE_OTHER): Payer: Medicare Other

## 2021-03-30 ENCOUNTER — Other Ambulatory Visit: Payer: Self-pay

## 2021-03-30 DIAGNOSIS — K760 Fatty (change of) liver, not elsewhere classified: Secondary | ICD-10-CM | POA: Diagnosis not present

## 2021-03-30 DIAGNOSIS — E1169 Type 2 diabetes mellitus with other specified complication: Secondary | ICD-10-CM

## 2021-03-30 DIAGNOSIS — D649 Anemia, unspecified: Secondary | ICD-10-CM

## 2021-03-30 DIAGNOSIS — E785 Hyperlipidemia, unspecified: Secondary | ICD-10-CM | POA: Diagnosis not present

## 2021-03-30 DIAGNOSIS — E669 Obesity, unspecified: Secondary | ICD-10-CM | POA: Diagnosis not present

## 2021-03-30 LAB — COMPREHENSIVE METABOLIC PANEL
ALT: 19 U/L (ref 0–35)
AST: 19 U/L (ref 0–37)
Albumin: 4 g/dL (ref 3.5–5.2)
Alkaline Phosphatase: 192 U/L — ABNORMAL HIGH (ref 39–117)
BUN: 20 mg/dL (ref 6–23)
CO2: 27 mEq/L (ref 19–32)
Calcium: 9.2 mg/dL (ref 8.4–10.5)
Chloride: 104 mEq/L (ref 96–112)
Creatinine, Ser: 0.87 mg/dL (ref 0.40–1.20)
GFR: 74.04 mL/min (ref >60.00)
Glucose, Bld: 99 mg/dL (ref 70–99)
Potassium: 4.2 mEq/L (ref 3.5–5.1)
Sodium: 140 mEq/L (ref 135–145)
Total Bilirubin: 0.4 mg/dL (ref 0.2–1.2)
Total Protein: 6.6 g/dL (ref 6.0–8.3)

## 2021-03-30 LAB — CBC WITH DIFFERENTIAL/PLATELET
Basophils Absolute: 0.1 10*3/uL (ref 0.0–0.1)
Basophils Relative: 1.2 % (ref 0.0–3.0)
Eosinophils Absolute: 0.2 10*3/uL (ref 0.0–0.7)
Eosinophils Relative: 1.7 % (ref 0.0–5.0)
HCT: 34.4 % — ABNORMAL LOW (ref 36.0–46.0)
Hemoglobin: 11 g/dL — ABNORMAL LOW (ref 12.0–15.0)
Lymphocytes Relative: 31.8 % (ref 12.0–46.0)
Lymphs Abs: 3 10*3/uL (ref 0.7–4.0)
MCHC: 32.1 g/dL (ref 30.0–36.0)
MCV: 82.2 fl (ref 78.0–100.0)
Monocytes Absolute: 0.5 10*3/uL (ref 0.1–1.0)
Monocytes Relative: 5.7 % (ref 3.0–12.0)
Neutro Abs: 5.6 10*3/uL (ref 1.4–7.7)
Neutrophils Relative %: 59.6 % (ref 43.0–77.0)
Platelets: 232 10*3/uL (ref 150.0–400.0)
RBC: 4.19 Mil/uL (ref 3.87–5.11)
RDW: 15.1 % (ref 11.5–15.5)
WBC: 9.3 10*3/uL (ref 4.0–10.5)

## 2021-03-30 LAB — HEMOGLOBIN A1C: Hgb A1c MFr Bld: 6.3 % (ref 4.6–6.5)

## 2021-03-30 LAB — LIPID PANEL
Cholesterol: 129 mg/dL (ref 0–200)
HDL: 40.8 mg/dL (ref >39.00)
LDL Cholesterol: 57 mg/dL (ref 0–99)
NonHDL: 87.78
Total CHOL/HDL Ratio: 3
Triglycerides: 154 mg/dL — ABNORMAL HIGH (ref 0.0–149.0)
VLDL: 30.8 mg/dL (ref 0.0–40.0)

## 2021-04-01 ENCOUNTER — Telehealth: Payer: Self-pay

## 2021-04-01 ENCOUNTER — Telehealth: Payer: Self-pay | Admitting: Cardiovascular Disease

## 2021-04-01 NOTE — Telephone Encounter (Signed)
Would not recommend sudafed with her hx of CAD.  I would recommend a good saline rinse (something like a netti pot). Can use afrin for 3 days if she has nasal congestion. Can also try Flonase. Oral antihistamine such as allegra or claritin.

## 2021-04-01 NOTE — Telephone Encounter (Signed)
Patient called in to see what she can take over the counter for her allergy symptoms and headaches. She mentioned sudafed as an option. Discussed some alternatives such as Coricidin HBP but I will check with our pharmacy team and will call back with their recommendations. She verbalized understanding with no further questions at this time.

## 2021-04-01 NOTE — Telephone Encounter (Signed)
Spoke with patient and reviewed pharmacy recommendations for her symptoms. She verbalized understanding and was agreeable with these remedies for now. She had no further questions at this time.

## 2021-04-01 NOTE — Telephone Encounter (Signed)
LMTCB in regards to lab results.  

## 2021-04-01 NOTE — Telephone Encounter (Signed)
Patient wants to now what over the counter meds she can take for her allergy symptoms and headache. Please assist

## 2021-04-01 NOTE — Telephone Encounter (Signed)
Patient called back please advise.

## 2021-04-09 DIAGNOSIS — E119 Type 2 diabetes mellitus without complications: Secondary | ICD-10-CM | POA: Diagnosis not present

## 2021-04-09 LAB — HM DIABETES EYE EXAM

## 2021-04-16 ENCOUNTER — Ambulatory Visit (INDEPENDENT_AMBULATORY_CARE_PROVIDER_SITE_OTHER): Payer: Medicare Other

## 2021-04-16 ENCOUNTER — Encounter: Payer: Self-pay | Admitting: Internal Medicine

## 2021-04-16 ENCOUNTER — Other Ambulatory Visit: Payer: Self-pay

## 2021-04-16 ENCOUNTER — Ambulatory Visit (INDEPENDENT_AMBULATORY_CARE_PROVIDER_SITE_OTHER): Payer: Medicare Other | Admitting: Internal Medicine

## 2021-04-16 VITALS — BP 116/78 | HR 93 | Temp 97.8°F | Ht 63.0 in | Wt 200.4 lb

## 2021-04-16 DIAGNOSIS — M47816 Spondylosis without myelopathy or radiculopathy, lumbar region: Secondary | ICD-10-CM | POA: Diagnosis not present

## 2021-04-16 DIAGNOSIS — Z1231 Encounter for screening mammogram for malignant neoplasm of breast: Secondary | ICD-10-CM

## 2021-04-16 DIAGNOSIS — M25552 Pain in left hip: Secondary | ICD-10-CM

## 2021-04-16 DIAGNOSIS — K760 Fatty (change of) liver, not elsewhere classified: Secondary | ICD-10-CM | POA: Diagnosis not present

## 2021-04-16 DIAGNOSIS — M5416 Radiculopathy, lumbar region: Secondary | ICD-10-CM | POA: Diagnosis not present

## 2021-04-16 DIAGNOSIS — E1169 Type 2 diabetes mellitus with other specified complication: Secondary | ICD-10-CM | POA: Diagnosis not present

## 2021-04-16 DIAGNOSIS — Z0001 Encounter for general adult medical examination with abnormal findings: Secondary | ICD-10-CM | POA: Diagnosis not present

## 2021-04-16 DIAGNOSIS — D126 Benign neoplasm of colon, unspecified: Secondary | ICD-10-CM

## 2021-04-16 DIAGNOSIS — R195 Other fecal abnormalities: Secondary | ICD-10-CM | POA: Diagnosis not present

## 2021-04-16 DIAGNOSIS — E669 Obesity, unspecified: Secondary | ICD-10-CM

## 2021-04-16 DIAGNOSIS — M1612 Unilateral primary osteoarthritis, left hip: Secondary | ICD-10-CM | POA: Diagnosis not present

## 2021-04-16 DIAGNOSIS — H65112 Acute and subacute allergic otitis media (mucoid) (sanguinous) (serous), left ear: Secondary | ICD-10-CM | POA: Diagnosis not present

## 2021-04-16 LAB — MICROALBUMIN / CREATININE URINE RATIO
Creatinine,U: 178.1 mg/dL
Microalb Creat Ratio: 1 mg/g (ref 0.0–30.0)
Microalb, Ur: 1.9 mg/dL (ref 0.0–1.9)

## 2021-04-16 MED ORDER — METFORMIN HCL ER 500 MG PO TB24: 500.0000 mg | ORAL_TABLET | Freq: Every day | ORAL | 1 refills | Status: DC

## 2021-04-16 MED ORDER — PREDNISONE 10 MG PO TABS: ORAL_TABLET | ORAL | 0 refills | Status: DC

## 2021-04-16 NOTE — Patient Instructions (Addendum)
You are due for colonoscopy     Referral to Dr Bary Castilla in progress  Your left ear looks like you are recovering from an infection.  I am prescribing prednisone for the persistent inflammation :  6 tablets all at once on Day 1,  Then taper by 1 tablet daily until gone  I am recommending you start taking metformin ER for the fatty liver.   Take it once daily in the evening with dinner. .  It is also helpful in curbing your appetite .  If it makes your stools persistent loose,  let me know and I will prescribe an alternative.   Your annual mammogram has been ordered.  You are encouraged (required) to call to make your appointment at [place] [phone]

## 2021-04-16 NOTE — Progress Notes (Signed)
Patient ID: Kayla Farmer, female    DOB: [birth year]  Age: 55 y.o. MRN: [id number]  The patient is here for annual  preventive examination and management of other chronic and acute problems.   The risk factors are reflected in the social history.  The roster of all physicians providing medical care to patient - is listed in the Snapshot section of the chart.  Activities of daily living:  The patient is 100% independent in all ADLs: dressing, toileting, feeding as well as independent mobility  Home safety : The patient has smoke detectors in the home. They wear seatbelts.  There are no firearms at home. There is no violence in the home.   There is no risks for hepatitis, STDs or HIV. There is no   history of blood transfusion. They have no travel history to infectious disease endemic areas of the world.  The patient has seen their dentist in the last six month. They have seen their eye doctor in the last year. They admit to slight hearing difficulty with regard to whispered voices and some television programs.  They have deferred audiologic testing in the last year.  They do not  have excessive sun exposure. Discussed the need for sun protection: hats, long sleeves and use of sunscreen if there is significant sun exposure.   Diet: the importance of a healthy diet is discussed. They do have a healthy diet.  The benefits of regular aerobic exercise were discussed. She has not been walking regularly for exercise .  Depression screen: there are no signs or vegative symptoms of depression- irritability, change in appetite, anhedonia, sadness/tearfullness.  The following portions of the patient's history were reviewed and updated as appropriate: allergies, current medications, past family history, past medical history,  past surgical history, past social history  and problem list.  Visual acuity was not assessed per patient preference since she has regular follow up with her ophthalmologist. Hearing and  body mass index were assessed and reviewed.   During the course of the visit the patient was educated and counseled about appropriate screening and preventive services including : fall prevention , diabetes screening, nutrition counseling, colorectal cancer screening, and recommended immunizations.    CC: The primary encounter diagnosis was Type 2 diabetes mellitus with obesity ([place]). Diagnoses of Left hip pain, Lumbar radicular pain, Encounter for screening mammogram for malignant neoplasm of breast, Tubular adenoma of colon, Serrated adenoma of colon, Positive colorectal cancer screening using Cologuard test, Non-recurrent subacute allergic otitis media of left ear, Encounter for general adult medical examination with abnormal findings, and Hepatic steatosis were also pertinent to this visit.  1) inability to lose weight and maintain weight loss. Not exercising. Not following low GI diet,  has type 2 DM   2)has been having  fecal urgency and incontinence , even occurring after her colonoscopy   had liquid stool and incontinence. Occurring several times per week (1-2)   History Cora has a past medical history of Anxiety, Arthritis, CAD (coronary artery disease), Claudication ([place]), Depression, GERD (gastroesophageal reflux disease), History of cervical cancer, History of tobacco abuse, Obesity, Other inflammations of eyelids, Positive colorectal cancer screening using Cologuard test (2019), and Sinus infection.   She has a past surgical history that includes Liver biopsy (04/26/2017); Tonsillectomy; csection x2; Colonoscopy with propofol (N/A, 01/24/2018); Dilation and curettage of uterus; LEFT HEART CATH AND CORONARY ANGIOGRAPHY (N/A, 01/07/2019); CORONARY STENT INTERVENTION (N/A, 01/07/2019); and Cardiac catheterization.   Her family history includes Coronary artery disease in her  father and mother.She reports that she quit smoking about 2 years ago. Her smoking use included cigarettes. She has a 30.00  pack-year smoking history. She has never used smokeless tobacco. She reports that she does not drink alcohol and does not use drugs.  Outpatient Medications Prior to Visit  Medication Sig Dispense Refill   aspirin EC 81 MG EC tablet Take 1 tablet (81 mg total) by mouth daily. 30 tablet 0   atorvastatin (LIPITOR) 80 MG tablet TAKE 1 TABLET BY MOUTH DAILY AT 6 PM 90 tablet 2   busPIRone (BUSPAR) 30 MG tablet TAKE 1 TABLET(30 MG) BY MOUTH THREE TIMES DAILY AS NEEDED FOR ANXIETY 90 tablet 0   metoprolol succinate (TOPROL-XL) 25 MG 24 hr tablet TAKE 1 TABLET(25 MG) BY MOUTH DAILY 90 tablet 1   omeprazole (PRILOSEC) 40 MG capsule TAKE 1 CAPSULE(40 MG) BY MOUTH DAILY 90 capsule 1   venlafaxine XR (EFFEXOR-XR) 75 MG 24 hr capsule TAKE 1 CAPSULE BY MOUTH EVERY DAY 90 capsule 1   ACCU-CHEK GUIDE test strip      Accu-Chek Softclix Lancets lancets SMARTSIG:Topical     blood glucose meter kit and supplies Use to check blood sugars up to two times daily. Dispense based on patient and insurance preference. 1 each 0   clindamycin (CLEOCIN) 150 MG capsule Take 150 mg by mouth as needed.     Efinaconazole 10 % SOLN Apply once dial to affected toenail 4 mL 2   Multiple Vitamins-Minerals (PRESERVISION AREDS 2 PO) Take 1 capsule by mouth in the morning and at bedtime.     No facility-administered medications prior to visit.    Review of Systems  Patient denies headache, fevers, malaise, unintentional weight loss, skin rash, eye pain, sinus congestion and sinus pain, sore throat, dysphagia,  hemoptysis , cough, dyspnea, wheezing, chest pain, palpitations, orthopnea, edema, abdominal pain, nausea, melena, diarrhea, constipation, flank pain, dysuria, hematuria, urinary  Frequency, nocturia, numbness, tingling, seizures,  Focal weakness, Loss of consciousness,  Tremor, insomnia, depression, anxiety, and suicidal ideation.     Objective:  BP 116/78   Pulse 93   Temp 97.8 F (36.6 C) (Skin)   Ht _0  (1.6 m)    Wt 200 lb 6.4 oz (90.9 kg)   LMP 10/13/2014 (Approximate)   SpO2 96%   BMI 35.50 kg/m   Physical Exam  General appearance: alert, cooperative and appears stated age Head: Normocephalic, without obvious abnormality, atraumatic Eyes: conjunctivae/corneas clear. PERRL, EOM's intact. Fundi benign. Ears: normal TM's and external ear canals both ears Nose: Nares normal. Septum midline. Mucosa normal. No drainage or sinus tenderness. Throat: lips, mucosa, and tongue normal; teeth and gums normal Neck: no adenopathy, no carotid bruit, no JVD, supple, symmetrical, trachea midline and thyroid not enlarged, symmetric, no tenderness/mass/nodules Lungs: clear to auscultation bilaterally Breasts: normal appearance, no masses or tenderness Heart: regular rate and rhythm, S1, S2 normal, no murmur, click, rub or gallop Abdomen: soft, non-tender; bowel sounds normal; no masses,  no organomegaly Extremities: extremities normal, atraumatic, no cyanosis or edema Pulses: 2+ and symmetric Skin: Skin color, texture, turgor normal. No rashes or lesions Neurologic: Alert and oriented X 3, normal strength and tone. Normal symmetric reflexes. Normal coordination and gait.      Assessment & Plan:   Problem List Items Addressed This Visit       Unprioritized   Screening for breast cancer   Relevant Orders   MM 3D SCREEN BREAST BILATERAL   Hepatic steatosis  She is no longer  losing  weight or motivated to exercise since her NSTEMI.   She is tolerating the statin . Alk phos is mildly elevated.  Continue statin  therapy is part of the treatment for fatty liver (along with weight loss and low glycemic index diet)  Lab Results  Component Value Date   ALT 19 03/30/2021   AST 19 03/30/2021   ALKPHOS 192 (H) 03/30/2021   BILITOT 0.4 03/30/2021         Type 2 diabetes mellitus with obesity ([place]) - Primary    Diet controlled, but now experiencing weight gain,  So she is now willing to start  Metformin   For help with losing weight.   Continue atorvastatin and aspirin   Lab Results  Component Value Date   HGBA1C 6.3 03/30/2021    Lab Results  Component Value Date   MICROALBUR 1.9 04/16/2021   MICROALBUR 1.6 03/19/2020           Relevant Medications   metFORMIN (GLUCOPHAGE-XR) 500 MG 24 hr tablet   Other Relevant Orders   Microalbumin / creatinine urine ratio (Completed)   Encounter for general adult medical examination with abnormal findings    age appropriate education and counseling updated, referrals for preventative services and immunizations addressed, dietary and smoking counseling addressed, most recent labs reviewed.  I have personally reviewed and have noted:   1) the patient's medical and social history 2) The pt's use of alcohol, tobacco, and illicit drugs 3) The patient's current medications and supplements 4) Functional ability including ADL's, fall risk, home safety risk, hearing and visual impairment 5) Diet and physical activities 6) Evidence for depression or mood disorder 7) The patient's height, weight, and BMI have been recorded in the chart   I have made referrals, and provided counseling and education based on review of the above       Positive colorectal cancer screening using Cologuard test    Referral to Dr Bary Castilla for follow up colonoscopy is due.  History of serrated adenomas  And fecal urgency       Serrated adenoma of colon   Relevant Orders   Ambulatory referral to General Surgery   Otitis media    Resolving by history and exam.  Prednisone taper prescribed.        Other Visit Diagnoses     Left hip pain       Relevant Orders   DG Hip Unilat W OR W/O Pelvis 2-3 Views Left (Completed)   Lumbar radicular pain       Relevant Orders   DG Lumbar Spine Complete (Completed)   Tubular adenoma of colon           Meds ordered this encounter  Medications   metFORMIN (GLUCOPHAGE-XR) 500 MG 24 hr tablet    Sig: Take 1 tablet (500 mg  total) by mouth daily with breakfast.    Dispense:  90 tablet    Refill:  1   predniSONE (DELTASONE) 10 MG tablet    Sig: 6 tablets on Day 1 , then reduce by 1 tablet daily until gone    Dispense:  21 tablet    Refill:  0    Medications Discontinued During This Encounter  Medication Reason   ACCU-CHEK GUIDE test strip Patient Preference   Accu-Chek Softclix Lancets lancets Patient Preference   blood glucose meter kit and supplies Patient Preference   clindamycin (CLEOCIN) 150 MG capsule Patient Preference   Efinaconazole 10 % SOLN  Patient Preference   Multiple Vitamins-Minerals (PRESERVISION AREDS 2 PO) Patient Preference    Follow-up: Return in about 6 months (around 10/17/2021) for follow up diabetes.   [name], MD

## 2021-04-19 DIAGNOSIS — H6690 Otitis media, unspecified, unspecified ear: Secondary | ICD-10-CM | POA: Insufficient documentation

## 2021-04-19 NOTE — Assessment & Plan Note (Signed)
She is no longer  losing  weight or motivated to exercise since her NSTEMI.   She is tolerating the statin . Alk phos is mildly elevated.  Continue statin  therapy is part of the treatment for fatty liver (along with weight loss and low glycemic index diet)  Lab Results  Component Value Date   ALT 19 03/30/2021   AST 19 03/30/2021   ALKPHOS 192 (H) 03/30/2021   BILITOT 0.4 03/30/2021

## 2021-04-19 NOTE — Assessment & Plan Note (Signed)

## 2021-04-19 NOTE — Assessment & Plan Note (Signed)
Diet controlled, but now experiencing weight gain,  So she is now willing to start  Metformin  For help with losing weight.   Continue atorvastatin and aspirin   Lab Results  Component Value Date   HGBA1C 6.3 03/30/2021    Lab Results  Component Value Date   MICROALBUR 1.9 04/16/2021   MICROALBUR 1.6 03/19/2020

## 2021-04-19 NOTE — Assessment & Plan Note (Signed)
Resolving by history and exam.  Prednisone taper prescribed.

## 2021-04-19 NOTE — Assessment & Plan Note (Addendum)
Referral to Dr Bary Castilla for follow up colonoscopy is due.  History of serrated adenomas  And fecal urgency

## 2021-04-23 ENCOUNTER — Other Ambulatory Visit: Payer: Self-pay | Admitting: Internal Medicine

## 2021-04-23 DIAGNOSIS — G8929 Other chronic pain: Secondary | ICD-10-CM

## 2021-04-23 DIAGNOSIS — M25559 Pain in unspecified hip: Secondary | ICD-10-CM

## 2021-04-28 ENCOUNTER — Telehealth: Payer: Self-pay

## 2021-04-28 ENCOUNTER — Ambulatory Visit: Payer: Medicare Other

## 2021-04-28 NOTE — Telephone Encounter (Signed)
Unable to reach patient for scheduled AWV. Left message to call the office back and reschedule.

## 2021-05-11 DIAGNOSIS — Z86010 Personal history of colonic polyps: Secondary | ICD-10-CM | POA: Diagnosis not present

## 2021-05-11 DIAGNOSIS — D649 Anemia, unspecified: Secondary | ICD-10-CM | POA: Diagnosis not present

## 2021-05-12 ENCOUNTER — Other Ambulatory Visit: Payer: Self-pay | Admitting: General Surgery

## 2021-05-12 NOTE — Progress Notes (Signed)
Subjective:     Patient ID: Kayla Farmer is a 55 y.o. female.   HPI   The following portions of the patient's history were reviewed and updated as appropriate.   This an established patient is here today for: office visit. Here for anemia and to discuss having a colonoscopy, last completed in 2019. She has a history of a tubular adenoma.   She states her bowels move 1-2 times a day, no bleeding.   She is here with her friend, Lorine Bears.   Review of Systems  Constitutional: Negative for chills and fever.  Respiratory: Negative for cough.          Chief Complaint  Patient presents with   Pre-op Exam      BP 136/74   Pulse 102   Temp 36.7 C (98 F)   Ht 162.6 cm (5' 4" )   Wt 90.7 kg (200 lb)   LMP 09/12/2013 (LMP Unknown)   SpO2 96%   BMI 34.33 kg/m        Past Medical History:  Diagnosis Date   Anxiety     Arthritis     Atherosclerosis of coronary artery of native heart with unstable angina pectoris (CMS-HCC)     Depression     GERD (gastroesophageal reflux disease)     History of recurrent ear infection     History of tobacco abuse     Hx of cervical cancer     Hyperlipidemia     Positive colorectal cancer screening using Cologuard test 2019   Prediabetes             Past Surgical History:  Procedure Laterality Date   CESAREAN SECTION        x2   COLONOSCOPY   01/2018   coronary stent intervention   01/07/2019   DILATION AND CURETTAGE OF UTERUS       GYNECOLOGIC CRYOSURGERY        for pre-cancerous cells   left hearty cath and coronary angiography   01/07/2019   LIVER BIOPSY   04/26/2017   TONSILLECTOMY                    OB History     Gravida  4   Para  3   Term  3   Preterm      AB  1   Living  3      SAB  1   IAB      Ectopic      Molar      Multiple      Live Births  3        Obstetric Comments  Age at first period 21 Age of first pregnancy 1             Social History           Socioeconomic History    Marital status: Married  Occupational History   Occupation: SAHM  Tobacco Use   Smoking status: Former Smoker      Packs/day: 1.00      Years: 30.00      Pack years: 30.00      Types: Cigarettes      Start date: 01/06/2019   Smokeless tobacco: Never Used  Vaping Use   Vaping Use: Never used  Substance and Sexual Activity   Alcohol use: Not Currently   Drug use: Never   Sexual activity: Yes      Birth control/protection: Post-menopausal  Allergies  Allergen Reactions   Codeine Itching      Current Medications        Current Outpatient Medications  Medication Sig Dispense Refill   aspirin 81 MG EC tablet Take 81 mg by mouth once daily       atorvastatin (LIPITOR) 80 MG tablet Take 80 mg by mouth once daily       busPIRone (BUSPAR) 30 MG tablet Take 30 mg by mouth once daily       clindamycin (CLEOCIN) 150 MG capsule Take 150 mg by mouth as directed For Dentist appt       metFORMIN (GLUCOPHAGE) 500 MG tablet Take 500 mg by mouth daily with breakfast       metoprolol succinate (TOPROL-XL) 25 MG XL tablet Take 25 mg by mouth once daily       omeprazole (PRILOSEC) 40 MG DR capsule Take 40 mg by mouth once daily       ticagrelor (BRILINTA) 90 mg tablet Take 90 mg by mouth 2 (two) times daily       venlafaxine (EFFEXOR-XR) 75 MG XR capsule TAKE 1 CAPSULE BY MOUTH EVERY DAY       vit A/C/E ac/ZnOx/cupric oxide (EYE VITAMIN AND MINERALS ORAL) Take by mouth once daily       nitroGLYcerin (NITROSTAT) 0.4 MG SL tablet Place 0.4 mg under the tongue every 5 (five) minutes as needed (Patient not taking: Reported on 05/11/2021)        No current facility-administered medications for this visit.             Family History  Problem Relation Age of Onset   Coronary Artery Disease (Blocked arteries around heart) Mother     Coronary Artery Disease (Blocked arteries around heart) Father     Breast cancer Neg Hx     Colon cancer Neg Hx               Objective:    Physical Exam Exam conducted with a chaperone present.  Constitutional:      Appearance: Normal appearance.  Cardiovascular:     Rate and Rhythm: Normal rate and regular rhythm.     Pulses: Normal pulses.     Heart sounds: Normal heart sounds.  Pulmonary:     Effort: Pulmonary effort is normal.     Breath sounds: Normal breath sounds.  Musculoskeletal:     Cervical back: Neck supple.  Skin:    General: Skin is warm and dry.  Neurological:     Mental Status: She is alert and oriented to person, place, and time.  Psychiatric:        Mood and Affect: Mood normal.        Behavior: Behavior normal.      Labs and Radiology:    Jan 24, 2018 colonoscopy pathology:    DIAGNOSIS:  A.  COLON POLYP, ASCENDING; COLD BIOPSY:  - SESSILE SERRATED ADENOMA.  - NEGATIVE FOR CYTOLOGIC DYSPLASIA AND MALIGNANCY.  Piecemeal removal based on location on the inner curve the hepatic flexure.  B.  COLON POLYP X2, SIGMOID; COLD BIOPSY:  - FRAGMENTS OF COLONIC MUCOSA WITH MUCOSAL HEMORRHAGE.  - NEGATIVE FOR DYSPLASIA AND MALIGNANCY.   C.  RECTUM POLYP; COLD SNARE:  - SERRATED POLYP, SEE NOTE.  - NEGATIVE FOR CYTOLOGIC DYSPLASIA, HIGH-GRADE DYSPLASIA AND MALIGNANCY.   Note: The differential diagnosis includes early sessile serrated adenoma  and traditional serrated adenoma.   D.  RECTUM POLYP; HOT SNARE AND COLD BIOPSY:  -  TUBULAR ADENOMA (2).  - NEGATIVE FOR HIGH-GRADE DYSPLASIA AND MALIGNANCY.    March 30, 2021 laboratory:   WBC 4.0 - 10.5 K/uL 9.3   RBC 3.87 - 5.11 Mil/uL 4.19   Hemoglobin 12.0 - 15.0 g/dL 11.0 Low    HCT 36.0 - 46.0 % 34.4 Low    MCV 78.0 - 100.0 fl 82.2   MCHC 30.0 - 36.0 g/dL 32.1   RDW 11.5 - 15.5 % 15.1   Platelets 150.0 - 400.0 K/uL 232.0   Neutrophils Relative % 43.0 - 77.0 % 59.6   Lymphocytes Relative 12.0 - 46.0 % 31.8   Monocytes Relative 3.0 - 12.0 % 5.7   Eosinophils Relative 0.0 - 5.0 % 1.7   Basophils Relative 0.0 - 3.0 % 1.2   Neutro Abs 1.4 - 7.7  K/uL 5.6   Lymphs Abs 0.7 - 4.0 K/uL 3.0   Monocytes Absolute 0.1 - 1.0 K/uL 0.5     Sodium 135 - 145 mEq/L 140   Potassium 3.5 - 5.1 mEq/L 4.2   Chloride 96 - 112 mEq/L 104   CO2 19 - 32 mEq/L 27   Glucose, Bld 70 - 99 mg/dL 99   BUN 6 - 23 mg/dL 20   Creatinine, Ser 0.40 - 1.20 mg/dL 0.87   Total Bilirubin 0.2 - 1.2 mg/dL 0.4   Alkaline Phosphatase 39 - 117 U/L 192 High    AST 0 - 37 U/L 19   ALT 0 - 35 U/L 19   Total Protein 6.0 - 8.3 g/dL 6.6   Albumin 3.5 - 5.2 g/dL 4.0   GFR >60.00 mL/min 74.04   Comment: Calculated using the CKD-EPI Creatinine Equation (2021)  Calcium 8.4 - 10.5 mg/dL 9.2     Component Ref Range & Units 1 mo ago   Hgb A1c MFr Bld 4.6 - 6.5 % 6.3       Ref Range & Units 1 mo ago  (03/30/21) 11 mo ago  (05/26/20) 1 yr ago  (03/19/20) 2 yr ago  (02/13/19) 2 yr ago  (01/08/19) 2 yr ago  (01/07/19) 2 yr ago  (10/22/18)  WBC 4.0 - 10.5 K/uL 9.3  8.6  7.6  8.5  10.8 High   10.4  9.9   RBC 3.87 - 5.11 Mil/uL 4.19  4.26  4.18  4.27  4.41 R  4.56 R  4.47   Hemoglobin 12.0 - 15.0 g/dL 11.0 Low   11.1 Low   10.9 Low   11.6 Low   11.9 Low   12.4  12.4   HCT 36.0 - 46.0 % 34.4 Low   34.1 Low   33.5 Low   34.9 Low   37.6  38.1  37.6   MCV 78.0 - 100.0 fl 82.2  80.1  80.2  81.6  85.3 R  83.6 R  84.2              Assessment:     Candidate for repeat colonoscopy.  Upper endoscopy indicated with mild fall in hemoglobin without associated significant change in MCV over the last 2 years.    Plan:     The patient was instructed in regards to colonoscopy prep by the staff.  Pros and cons of the procedure reviewed.  Based on her multiple past polyps colonoscopy is indicated.      This note is partially prepared by Karie Fetch, RN, acting as a scribe in the presence of Dr. Hervey Ard, MD.  The documentation recorded by the scribe  accurately reflects the service I personally performed and the decisions made by me.    Robert Bellow, MD FACS

## 2021-05-19 ENCOUNTER — Telehealth: Payer: Self-pay

## 2021-05-19 MED ORDER — VENLAFAXINE HCL ER 75 MG PO CP24: ORAL_CAPSULE | ORAL | 1 refills | Status: DC

## 2021-05-19 NOTE — Telephone Encounter (Signed)
Pt needs a refill on venlafaxine XR (EFFEXOR-XR) 75 MG 24 hr capsule. She is going out of town this weekend so please send asap

## 2021-06-08 ENCOUNTER — Encounter: Payer: Self-pay | Admitting: General Surgery

## 2021-06-09 ENCOUNTER — Encounter: Payer: Self-pay | Admitting: General Surgery

## 2021-06-09 ENCOUNTER — Ambulatory Visit: Payer: Medicare Other | Admitting: Anesthesiology

## 2021-06-09 ENCOUNTER — Ambulatory Visit
Admission: RE | Admit: 2021-06-09 | Discharge: 2021-06-09 | Disposition: A | Discharge status: Home / Self Care | Payer: Medicare Other | Attending: General Surgery | Admitting: General Surgery

## 2021-06-09 ENCOUNTER — Encounter
Admission: RE | Disposition: A | Discharge status: Home / Self Care | Payer: Self-pay | Source: Home / Self Care | Attending: General Surgery

## 2021-06-09 DIAGNOSIS — D120 Benign neoplasm of cecum: Secondary | ICD-10-CM | POA: Diagnosis not present

## 2021-06-09 DIAGNOSIS — R7303 Prediabetes: Secondary | ICD-10-CM | POA: Diagnosis not present

## 2021-06-09 DIAGNOSIS — Z86010 Personal history of colonic polyps: Secondary | ICD-10-CM | POA: Diagnosis not present

## 2021-06-09 DIAGNOSIS — D509 Iron deficiency anemia, unspecified: Secondary | ICD-10-CM | POA: Insufficient documentation

## 2021-06-09 DIAGNOSIS — D123 Benign neoplasm of transverse colon: Secondary | ICD-10-CM | POA: Insufficient documentation

## 2021-06-09 DIAGNOSIS — K5730 Diverticulosis of large intestine without perforation or abscess without bleeding: Secondary | ICD-10-CM | POA: Insufficient documentation

## 2021-06-09 DIAGNOSIS — K317 Polyp of stomach and duodenum: Secondary | ICD-10-CM | POA: Diagnosis not present

## 2021-06-09 DIAGNOSIS — D124 Benign neoplasm of descending colon: Secondary | ICD-10-CM | POA: Insufficient documentation

## 2021-06-09 DIAGNOSIS — Z955 Presence of coronary angioplasty implant and graft: Secondary | ICD-10-CM | POA: Insufficient documentation

## 2021-06-09 DIAGNOSIS — Z7982 Long term (current) use of aspirin: Secondary | ICD-10-CM | POA: Diagnosis not present

## 2021-06-09 DIAGNOSIS — D128 Benign neoplasm of rectum: Secondary | ICD-10-CM | POA: Diagnosis not present

## 2021-06-09 DIAGNOSIS — D125 Benign neoplasm of sigmoid colon: Secondary | ICD-10-CM | POA: Insufficient documentation

## 2021-06-09 DIAGNOSIS — I2510 Atherosclerotic heart disease of native coronary artery without angina pectoris: Secondary | ICD-10-CM | POA: Diagnosis not present

## 2021-06-09 DIAGNOSIS — K219 Gastro-esophageal reflux disease without esophagitis: Secondary | ICD-10-CM | POA: Diagnosis not present

## 2021-06-09 DIAGNOSIS — Z79899 Other long term (current) drug therapy: Secondary | ICD-10-CM | POA: Diagnosis not present

## 2021-06-09 DIAGNOSIS — K635 Polyp of colon: Secondary | ICD-10-CM | POA: Diagnosis not present

## 2021-06-09 DIAGNOSIS — E785 Hyperlipidemia, unspecified: Secondary | ICD-10-CM | POA: Diagnosis not present

## 2021-06-09 DIAGNOSIS — Z87891 Personal history of nicotine dependence: Secondary | ICD-10-CM | POA: Diagnosis not present

## 2021-06-09 DIAGNOSIS — Z6836 Body mass index (BMI) 36.0-36.9, adult: Secondary | ICD-10-CM | POA: Diagnosis not present

## 2021-06-09 DIAGNOSIS — Z7984 Long term (current) use of oral hypoglycemic drugs: Secondary | ICD-10-CM | POA: Diagnosis not present

## 2021-06-09 DIAGNOSIS — I252 Old myocardial infarction: Secondary | ICD-10-CM | POA: Insufficient documentation

## 2021-06-09 DIAGNOSIS — Z1211 Encounter for screening for malignant neoplasm of colon: Secondary | ICD-10-CM | POA: Insufficient documentation

## 2021-06-09 DIAGNOSIS — E669 Obesity, unspecified: Secondary | ICD-10-CM | POA: Insufficient documentation

## 2021-06-09 DIAGNOSIS — Z885 Allergy status to narcotic agent status: Secondary | ICD-10-CM | POA: Insufficient documentation

## 2021-06-09 DIAGNOSIS — Z8541 Personal history of malignant neoplasm of cervix uteri: Secondary | ICD-10-CM | POA: Diagnosis not present

## 2021-06-09 DIAGNOSIS — K5790 Diverticulosis of intestine, part unspecified, without perforation or abscess without bleeding: Secondary | ICD-10-CM | POA: Diagnosis not present

## 2021-06-09 HISTORY — DX: Acute myocardial infarction, unspecified: I21.9

## 2021-06-09 HISTORY — PX: ESOPHAGOGASTRODUODENOSCOPY (EGD) WITH PROPOFOL: SHX5813

## 2021-06-09 HISTORY — PX: COLONOSCOPY WITH PROPOFOL: SHX5780

## 2021-06-09 HISTORY — DX: Hyperlipidemia, unspecified: E78.5

## 2021-06-09 HISTORY — DX: Prediabetes: R73.03

## 2021-06-09 LAB — GLUCOSE, CAPILLARY: Glucose-Capillary: 101 mg/dL — ABNORMAL HIGH (ref 70–99)

## 2021-06-09 SURGERY — ESOPHAGOGASTRODUODENOSCOPY (EGD) WITH PROPOFOL
Anesthesia: General

## 2021-06-09 MED ORDER — VASOPRESSIN 20 UNIT/ML IV SOLN
INTRAVENOUS | Status: DC | PRN
  Administered 2021-06-09: 1 [IU] via INTRAVENOUS

## 2021-06-09 MED ORDER — SODIUM CHLORIDE 0.9 % IV SOLN
INTRAVENOUS | Status: DC
  Administered 2021-06-09: 1000 mL via INTRAVENOUS

## 2021-06-09 MED ORDER — PROPOFOL 500 MG/50ML IV EMUL
INTRAVENOUS | Status: DC | PRN
  Administered 2021-06-09: 145 ug/kg/min via INTRAVENOUS

## 2021-06-09 MED ORDER — LIDOCAINE HCL (CARDIAC) PF 100 MG/5ML IV SOSY
PREFILLED_SYRINGE | INTRAVENOUS | Status: DC | PRN
  Administered 2021-06-09: 100 mg via INTRAVENOUS

## 2021-06-09 MED ORDER — PROPOFOL 10 MG/ML IV BOLUS
INTRAVENOUS | Status: DC | PRN
  Administered 2021-06-09: 70 mg via INTRAVENOUS

## 2021-06-09 MED ORDER — GLYCOPYRROLATE 0.2 MG/ML IJ SOLN
INTRAMUSCULAR | Status: DC | PRN
  Administered 2021-06-09: .2 mg via INTRAVENOUS

## 2021-06-09 NOTE — Op Note (Signed)
[facility] Patient Name: Kayla Farmer Procedure Date: 06/09/2021 7:06 AM MRN: [id number] Account #: [id number] Date of Birth: 02/27/1966 Admit Type: Outpatient Age: 55 Room: [facility] ENDO ROOM 1 Gender: Female Note Status: Finalized Instrument Name: Peds Colonoscope [id number] Procedure:             Colonoscopy Indications:           High risk colon cancer surveillance: Personal history                         of colonic polyps Providers:             Robert Bellow, MD Referring MD:          Deborra Medina, MD (Referring MD) Medicines:             Propofol per Anesthesia Procedure:             Pre-Anesthesia Assessment:                        - Prior to the procedure, a History and Physical was                         performed, and patient medications, allergies and                         sensitivities were reviewed. The patient's tolerance                         of previous anesthesia was reviewed.                        - The risks and benefits of the procedure and the                         sedation options and risks were discussed with the                         patient. All questions were answered and informed                         consent was obtained.                        After obtaining informed consent, the colonoscope was                         passed under direct vision. Throughout the procedure,                         the patient's blood pressure, pulse, and oxygen                         saturations were monitored continuously. The                         Colonoscope was introduced through the anus and                         advanced to the the cecum, identified by appendiceal  orifice and ileocecal valve. The colonoscopy was                         performed without difficulty. The patient tolerated                         the procedure well. The quality of the bowel                         preparation  was excellent. Findings:      A few medium-mouthed diverticula were found in the sigmoid colon.      Multiple polyps were identified. Descending colon biopsy x 1, cold snare       x 1.      Cecal polyP Hot snare.      Transverse colon poly cold biopxy x 2.      Transverse colon (distal) hot snare.      Descending colon polyps x 2. Biopsy.      Sigmoid polyp cold biopsy x 3.      Rectal polyp AT 10 cm, hot snare x 2. Impression:            - Mild diverticulosis in the sigmoid colon.                        - Multiple 5 to 10 mm, non-bleeding polyps in the                         rectum (Benign Appearing) and in the colon, removed                         with a cold biopsy forceps, removed with a hot snare                         and removed with a cold snare. Resected and retrieved.                         Biopsied. Recommendation:        - Telephone endoscopist for pathology results in 1                         week. Procedure Code(s):     --- Professional ---                        [phone], Colonoscopy, flexible; diagnostic, including                         collection of specimen(s) by brushing or washing, when                         performed (separate procedure) Diagnosis Code(s):     --- Professional ---                        K57.30, Diverticulosis of large intestine without                         perforation or abscess without bleeding  K63.5, Polyp of colon                        K62.1, Rectal polyp                        Z86.010, Personal history of colonic polyps CPT copyright 2019 American Medical Association. All rights reserved. The codes documented in this report are preliminary and upon coder review may  be revised to meet current compliance requirements. Robert Bellow, MD 06/09/2021 8:42:43 AM This report has been signed electronically. Number of Addenda: 0 Note Initiated On: 06/09/2021 7:06 AM Scope Withdrawal Time: 0 hours 26 minutes 38  seconds  Total Procedure Duration: 0 hours 42 minutes 13 seconds       [facility]

## 2021-06-09 NOTE — Anesthesia Procedure Notes (Signed)
Procedure Name: General with mask airway Date/Time: 06/09/2021 7:47 AM Performed by: Kelton Pillar, CRNA Pre-anesthesia Checklist: Patient identified, Emergency Drugs available, Suction available and Patient being monitored Patient Re-evaluated:Patient Re-evaluated prior to induction Oxygen Delivery Method: Simple face mask Induction Type: IV induction Placement Confirmation: positive ETCO2 and CO2 detector Dental Injury: Teeth and Oropharynx as per pre-operative assessment

## 2021-06-09 NOTE — Transfer of Care (Signed)
Immediate Anesthesia Transfer of Care Note  Patient: Kayla Farmer  Procedure(s) Performed: ESOPHAGOGASTRODUODENOSCOPY (EGD) WITH PROPOFOL COLONOSCOPY WITH PROPOFOL  Patient Location: Endoscopy Unit  Anesthesia Type:General  Level of Consciousness: drowsy and patient cooperative  Airway & Oxygen Therapy: Patient Spontanous Breathing and Patient connected to face mask oxygen  Post-op Assessment: Report given to RN and Post -op Vital signs reviewed and stable  Post vital signs: Reviewed and stable  Last Vitals:  Vitals Value Taken Time  BP 92/64 06/09/21 0840  Temp 35.7 C 06/09/21 0839  Pulse 82 06/09/21 0843  Resp 12 06/09/21 0843  SpO2 98 % 06/09/21 0843  Vitals shown include unvalidated device data.  Last Pain:  Vitals:   06/09/21 0839  TempSrc: Temporal  PainSc: Asleep         Complications: No notable events documented.

## 2021-06-09 NOTE — Op Note (Signed)
[facility] Patient Name: Kayla Farmer Procedure Date: 06/09/2021 7:07 AM MRN: [id number] Account #: [id number] Date of Birth: 08-11-1966 Admit Type: Outpatient Age: 55 Room: [facility] ENDO ROOM 1 Gender: Female Note Status: Finalized Instrument Name: Upper Endoscope [id number] Procedure:             Upper GI endoscopy Indications:           Iron deficiency anemia Providers:             Robert Bellow, MD Referring MD:          Deborra Medina, MD (Referring MD) Medicines:             Propofol per Anesthesia Complications:         No immediate complications. Procedure:             Pre-Anesthesia Assessment:                        - Prior to the procedure, a History and Physical was                         performed, and patient medications, allergies and                         sensitivities were reviewed. The patient's tolerance                         of previous anesthesia was reviewed.                        - The risks and benefits of the procedure and the                         sedation options and risks were discussed with the                         patient. All questions were answered and informed                         consent was obtained.                        After obtaining informed consent, the endoscope was                         passed under direct vision. Throughout the procedure,                         the patient's blood pressure, pulse, and oxygen                         saturations were monitored continuously. The Endoscope                         was introduced through the mouth, and advanced to the                         third part of duodenum. The upper GI endoscopy was  accomplished without difficulty. The patient tolerated                         the procedure well. Findings:      The esophagus was normal.      A few 3 mm sessile polyps with no bleeding and no stigmata of recent       bleeding were  found in the gastric fundus.      The examined duodenum was normal. Impression:            - Normal esophagus.                        - A few gastric polyps.                        - Normal examined duodenum.                        - No specimens collected. Recommendation:        - Perform a colonoscopy today. Procedure Code(s):     --- Professional ---                        [phone], Esophagogastroduodenoscopy, flexible,                         transoral; diagnostic, including collection of                         specimen(s) by brushing or washing, when performed                         (separate procedure) Diagnosis Code(s):     --- Professional ---                        K31.7, Polyp of stomach and duodenum                        D50.9, Iron deficiency anemia, unspecified CPT copyright 2019 American Medical Association. All rights reserved. The codes documented in this report are preliminary and upon coder review may  be revised to meet current compliance requirements. Robert Bellow, MD 06/09/2021 7:51:06 AM This report has been signed electronically. Number of Addenda: 0 Note Initiated On: 06/09/2021 7:07 AM Estimated Blood Loss:  Estimated blood loss: none.      [facility]

## 2021-06-09 NOTE — H&P (Addendum)
Kayla Farmer [id number] Apr 07, 1966     HPI: 55 y/o with multiple polyps in 2019. For follow up exam. Tolerated prep well.    Medications Prior to Admission  Medication Sig Dispense Refill Last Dose   aspirin EC 81 MG EC tablet Take 1 tablet (81 mg total) by mouth daily. 30 tablet 0 Past Week   atorvastatin (LIPITOR) 80 MG tablet TAKE 1 TABLET BY MOUTH DAILY AT 6 PM 90 tablet 2 Past Week   busPIRone (BUSPAR) 30 MG tablet TAKE 1 TABLET(30 MG) BY MOUTH THREE TIMES DAILY AS NEEDED FOR ANXIETY 90 tablet 0 Past Week   metFORMIN (GLUCOPHAGE-XR) 500 MG 24 hr tablet Take 1 tablet (500 mg total) by mouth daily with breakfast. 90 tablet 1 Past Week   metoprolol succinate (TOPROL-XL) 25 MG 24 hr tablet TAKE 1 TABLET(25 MG) BY MOUTH DAILY 90 tablet 1 Past Week   Multiple Vitamins-Minerals (EYE VITAMINS & MINERALS PO) Take 1 tablet by mouth daily at 8 pm.   Past Week   omeprazole (PRILOSEC) 40 MG capsule TAKE 1 CAPSULE(40 MG) BY MOUTH DAILY 90 capsule 1 Past Week   venlafaxine XR (EFFEXOR-XR) 75 MG 24 hr capsule TAKE 1 CAPSULE BY MOUTH EVERY DAY 90 capsule 1 Past Week   predniSONE (DELTASONE) 10 MG tablet 6 tablets on Day 1 , then reduce by 1 tablet daily until gone (Patient not taking: Reported on 06/09/2021) 21 tablet 0 Completed Course   Allergies  Allergen Reactions   Codeine Itching   Past Medical History:  Diagnosis Date   Anxiety    Arthritis    CAD (coronary artery disease)    a. 12/2018 NSTEMI/PCI: LM nl, LAD 30p, 30m(2.25x22 Resolute Onyx DES), LCX nl, OM2 nl, RCA 435mRPDA 100 w/ L->R collats (likely culprit - med rx). EF 50-55%.   [facility]   a. 07/2019 - L>R.   Depression    GERD (gastroesophageal reflux disease)    History of cervical cancer    History of tobacco abuse    a. 30 pack year - quit 12/2018 @ time of NSTEMI.   HLD (hyperlipidemia)    Myocardial infarction ([facility] [place]   Obesity    Other inflammations of eyelids    Positive colorectal cancer screening using  Cologuard test 2019   Pre-diabetes    Sinus infection    Past Surgical History:  Procedure Laterality Date   CARDIAC CATHETERIZATION     COLONOSCOPY WITH PROPOFOL N/A 01/24/2018   Procedure: COLONOSCOPY WITH PROPOFOL;  Surgeon: ByRobert BellowMD;  Location: [facility] ENDOSCOPY;  Service: Endoscopy;  Laterality: N/A;   CORONARY STENT INTERVENTION N/A 01/07/2019   Procedure: CORONARY STENT INTERVENTION;  Surgeon: ArWellington HampshireMD;  [place] [facility];  Service: Cardiovascular;  Laterality: N/A;  LAD   csection x2     DILATION AND CURETTAGE OF UTERUS     LEFT HEART CATH AND CORONARY ANGIOGRAPHY N/A 01/07/2019   Procedure: LEFT HEART CATH AND CORONARY ANGIOGRAPHY;  Surgeon: ArWellington HampshireMD;  [place] [facility];  Service: Cardiovascular;  Laterality: N/A;   LIVER BIOPSY  04/26/2017   TONSILLECTOMY     Social History   Socioeconomic History   Marital status: Married    Spouse name: Not on file   Number of children: Not on file   Years of education: Not on file   Highest education level: Not on file  Occupational History   Not on file  Tobacco Use  Smoking status: Former    Packs/day: 1.00    Years: 30.00    Pack years: 30.00    Types: Cigarettes    Quit date: 01/06/2019    Years since quitting: 2.4   Smokeless tobacco: Never   Tobacco comments:    smokes one pack of cigarettes per day since age 39, quit during pregnancies.  Vaping Use   Vaping Use: Never used  Substance and Sexual Activity   Alcohol use: No    Alcohol/week: 0.0 standard drinks   Drug use: No   Sexual activity: Not on file  Other Topics Concern   Not on file  Social History Narrative   Lives with spouse and children.  Daughter in Sports coach of Loletha Carrow and Josph Macho.   Quit high school in the 9th grade.  Was raised by grandmother due to parental conflicts.   Social Determinants of Health   Financial Resource Strain: Not on file  Food Insecurity: Not on file  Transportation  Needs: Not on file  Physical Activity: Not on file  Stress: Not on file  Social Connections: Not on file  Intimate Partner Violence: Not on file   Social History   Social History Narrative   Lives with spouse and children.  Daughter in Sports coach of Loletha Carrow and Josph Macho.   Quit high school in the 9th grade.  Was raised by grandmother due to parental conflicts.     ROS: Negative.     PE: HEENT: Negative. Lungs: Clear. Cardio: RR.   Long standing normochromic anemia.   Jan 24, 2018 colonoscopy pathology:    DIAGNOSIS:  A.  COLON POLYP, ASCENDING; COLD BIOPSY:  - SESSILE SERRATED ADENOMA.  - NEGATIVE FOR CYTOLOGIC DYSPLASIA AND MALIGNANCY.  Piecemeal removal based on location on the inner curve the hepatic flexure.  B.  COLON POLYP X2, SIGMOID; COLD BIOPSY:  - FRAGMENTS OF COLONIC MUCOSA WITH MUCOSAL HEMORRHAGE.  - NEGATIVE FOR DYSPLASIA AND MALIGNANCY.   C.  RECTUM POLYP; COLD SNARE:  - SERRATED POLYP, SEE NOTE.  - NEGATIVE FOR CYTOLOGIC DYSPLASIA, HIGH-GRADE DYSPLASIA AND MALIGNANCY.   Note: The differential diagnosis includes early sessile serrated adenoma  and traditional serrated adenoma.   D.  RECTUM POLYP; HOT SNARE AND COLD BIOPSY:  - TUBULAR ADENOMA (2).  - NEGATIVE FOR HIGH-GRADE DYSPLASIA AND MALIGNANCY.  Assessment/Plan:  Proceed with planned upper and lower endoscopy.   Robert Bellow

## 2021-06-09 NOTE — Anesthesia Preprocedure Evaluation (Signed)
Anesthesia Evaluation  Patient identified by MRN, date of birth, ID band Patient awake    Reviewed: Allergy & Precautions, NPO status , Patient's Chart, lab work & pertinent test results  History of Anesthesia Complications Negative for: history of anesthetic complications  Airway Mallampati: II  TM Distance: >3 FB Neck ROM: Full    Dental  (+) Implants   Pulmonary neg sleep apnea, neg COPD, former smoker,    breath sounds clear to auscultation- rhonchi (-) wheezing      Cardiovascular (-) angina+ CAD, + Past MI and + Cardiac Stents   Rhythm:Regular Rate:Normal - Systolic murmurs and - Diastolic murmurs    Neuro/Psych neg Seizures PSYCHIATRIC DISORDERS Anxiety Depression negative neurological ROS     GI/Hepatic Neg liver ROS, GERD  ,  Endo/Other  diabetes, Oral Hypoglycemic Agents  Renal/GU negative Renal ROS     Musculoskeletal  (+) Arthritis ,   Abdominal (+) + obese,   Peds  Hematology  (+) anemia ,   Anesthesia Other Findings Past Medical History: No date: Anxiety No date: Arthritis No date: CAD (coronary artery disease)     Comment:  a. 12/2018 NSTEMI/PCI: LM nl, LAD 30p, 36m(2.25x22               Resolute Onyx DES), LCX nl, OM2 nl, RCA 453mRPDA 100 w/               L->R collats (likely culprit - med rx). EF 50-55%. [facility]    Comment:  a. 07/2019 - L>R. No date: Depression No date: GERD (gastroesophageal reflux disease) No date: History of cervical cancer No date: History of tobacco abuse     Comment:  a. 30 pack year - quit 12/2018 @ time of NSTEMI. No date: HLD (hyperlipidemia) No date: [place]: Obesity No date: Other inflammations of eyelids 2019: Positive colorectal cancer screening using Cologuard test No date: Pre-diabetes No date: Sinus infection   Reproductive/Obstetrics                             Anesthesia  Physical Anesthesia Plan  ASA: 3  Anesthesia Plan: General   Post-op Pain Management:    Induction: Intravenous  PONV Risk Score and Plan: 2 and Propofol infusion  Airway Management Planned: Natural Airway  Additional Equipment:   Intra-op Plan:   Post-operative Plan:   Informed Consent: I have reviewed the patients History and Physical, chart, labs and discussed the procedure including the risks, benefits and alternatives for the proposed anesthesia with the patient or authorized representative who has indicated his/her understanding and acceptance.     Dental advisory given  Plan Discussed with: CRNA and Anesthesiologist  Anesthesia Plan Comments:         Anesthesia Quick Evaluation

## 2021-06-09 NOTE — Anesthesia Postprocedure Evaluation (Signed)
Anesthesia Post Note  Patient: MILANY GECK  Procedure(s) Performed: ESOPHAGOGASTRODUODENOSCOPY (EGD) WITH PROPOFOL COLONOSCOPY WITH PROPOFOL  Patient location during evaluation: Endoscopy Anesthesia Type: General Level of consciousness: awake and alert and oriented Pain management: pain level controlled Vital Signs Assessment: post-procedure vital signs reviewed and stable Respiratory status: spontaneous breathing, nonlabored ventilation and respiratory function stable Cardiovascular status: blood pressure returned to baseline and stable Postop Assessment: no signs of nausea or vomiting Anesthetic complications: no   No notable events documented.   Last Vitals:  Vitals:   06/09/21 0855 06/09/21 0859  BP: 93/65 98/64  Pulse:  79  Resp:  19  Temp:    SpO2:  95%    Last Pain:  Vitals:   06/09/21 0859  TempSrc:   PainSc: 0-No pain                 Jamiesha Victoria

## 2021-06-10 ENCOUNTER — Encounter: Payer: Self-pay | Admitting: General Surgery

## 2021-06-10 ENCOUNTER — Other Ambulatory Visit: Payer: Self-pay | Admitting: General Surgery

## 2021-06-10 DIAGNOSIS — K635 Polyp of colon: Secondary | ICD-10-CM

## 2021-06-10 LAB — HM COLONOSCOPY

## 2021-06-10 LAB — SURGICAL PATHOLOGY

## 2021-06-21 ENCOUNTER — Other Ambulatory Visit: Payer: Self-pay | Admitting: Cardiovascular Disease

## 2021-06-21 ENCOUNTER — Other Ambulatory Visit: Payer: Self-pay | Admitting: Internal Medicine

## 2021-06-21 DIAGNOSIS — E785 Hyperlipidemia, unspecified: Secondary | ICD-10-CM

## 2021-06-21 DIAGNOSIS — I25118 Atherosclerotic heart disease of native coronary artery with other forms of angina pectoris: Secondary | ICD-10-CM

## 2021-06-23 ENCOUNTER — Inpatient Hospital Stay: Payer: Medicare Other

## 2021-06-23 ENCOUNTER — Inpatient Hospital Stay: Payer: Medicare Other | Admitting: Licensed Clinical Social Worker

## 2021-07-06 ENCOUNTER — Encounter: Payer: Self-pay | Admitting: Licensed Clinical Social Worker

## 2021-07-06 ENCOUNTER — Inpatient Hospital Stay: Payer: Medicare Other | Attending: Oncology | Admitting: Licensed Clinical Social Worker

## 2021-07-06 ENCOUNTER — Inpatient Hospital Stay: Payer: Medicare Other

## 2021-07-06 ENCOUNTER — Other Ambulatory Visit: Payer: Self-pay

## 2021-07-06 DIAGNOSIS — Z86010 Personal history of colonic polyps: Secondary | ICD-10-CM

## 2021-07-06 NOTE — Progress Notes (Signed)
REFERRING PROVIDER: Robert Bellow, MD [place],  [place] [address]  PRIMARY PROVIDER:  [name], MD  PRIMARY REASON FOR VISIT:  1. Personal history of colonic polyps      HISTORY OF PRESENT ILLNESS:   Kayla Farmer, a 55 y.o. female, was seen for a [place] cancer genetics consultation at the request of Dr. Bary Castilla due to a personal history of colon polyps.  Kayla Farmer presents to clinic today to discuss the possibility of a hereditary predisposition to cancer, genetic testing, and to further clarify her future cancer risks, as well as potential cancer risks for family members.   Kayla Farmer is a 55 y.o. female with no personal history of cancer.    CANCER HISTORY:  Oncology History   No history exists.     RISK FACTORS:  Menarche was at age 31.  First live birth at age 62 OCP use for approximately 5 years.  Ovaries intact: yes.  Hysterectomy: no.  Menopausal status: postmenopausal.  HRT use: 0 years. Colonoscopy: yes;  2019- 5 sessile serrated polyps, 2022- 13 sessile serrated polyps . Mammogram within the last year: no. Number of breast biopsies: 0. Up to date with pelvic exams: yes. Any excessive radiation exposure in the past: no  Past Medical History:  Diagnosis Date   Anxiety    Arthritis    CAD (coronary artery disease)    a. 12/2018 NSTEMI/PCI: LM nl, LAD 30p, 10m(2.25x22 Resolute Onyx DES), LCX nl, OM2 nl, RCA 412mRPDA 100 w/ L->R collats (likely culprit - med rx). EF 50-55%.   [facility]   a. 07/2019 - L>R.   Depression    GERD (gastroesophageal reflux disease)    History of cervical cancer    History of tobacco abuse    a. 30 pack year - quit 12/2018 @ time of NSTEMI.   HLD (hyperlipidemia)    [place] infarction ([facility]   Obesity    Other inflammations of eyelids    Positive colorectal cancer screening using Cologuard test 2019   Pre-diabetes    Sinus infection     Past Surgical History:  Procedure Laterality  Date   CARDIAC CATHETERIZATION     COLONOSCOPY WITH PROPOFOL N/A 01/24/2018   Procedure: COLONOSCOPY WITH PROPOFOL;  Surgeon: ByRobert BellowMD;  Location: [facility] ENDOSCOPY;  Service: Endoscopy;  Laterality: N/A;   COLONOSCOPY WITH PROPOFOL N/A 06/09/2021   Procedure: COLONOSCOPY WITH PROPOFOL;  Surgeon: ByRobert BellowMD;  Location: [facility] ENDOSCOPY;  Service: Endoscopy;  Laterality: N/A;   CORONARY STENT INTERVENTION N/A 01/07/2019   Procedure: CORONARY STENT INTERVENTION;  Surgeon: ArWellington HampshireMD;  [facility];  Service: Cardiovascular;  Laterality: N/A;  LAD   csection x2     DILATION AND CURETTAGE OF UTERUS     ESOPHAGOGASTRODUODENOSCOPY (EGD) WITH PROPOFOL N/A 06/09/2021   Procedure: ESOPHAGOGASTRODUODENOSCOPY (EGD) WITH PROPOFOL;  Surgeon: ByRobert BellowMD;  [facility];  Service: Endoscopy;  Laterality: N/A;   LEFT HEART CATH AND CORONARY ANGIOGRAPHY N/A 01/07/2019   Procedure: LEFT HEART CATH AND CORONARY ANGIOGRAPHY;  Surgeon: ArWellington HampshireMD;  [place] [facility];  Service: Cardiovascular;  Laterality: N/A;   LIVER BIOPSY  04/26/2017   TONSILLECTOMY      Social History   Socioeconomic History   Marital status: Married    Spouse name: Not on file   Number of children: Not on file   Years of education:  Not on file   Highest education level: Not on file  Occupational History   Not on file  Tobacco Use   Smoking status: Former    Packs/day: 1.00    Years: 30.00    Pack years: 30.00    Types: Cigarettes    Quit date: 01/06/2019    Years since quitting: 2.4   Smokeless tobacco: Never   Tobacco comments:    smokes one pack of cigarettes per day since age 72, quit during pregnancies.  Vaping Use   Vaping Use: Never used  Substance and Sexual Activity   Alcohol use: No    Alcohol/week: 0.0 standard drinks   Drug use: No   Sexual activity: Not on file  Other Topics Concern   Not on file  Social History  Narrative   Lives with spouse and children.  Daughter in Sports coach of Loletha Carrow and Josph Macho.   Quit high school in the 9th grade.  Was raised by grandmother due to parental conflicts.   Social Determinants of Health   Financial Resource Strain: Not on file  Food Insecurity: Not on file  Transportation Needs: Not on file  Physical Activity: Not on file  Stress: Not on file  Social Connections: Not on file     FAMILY HISTORY:  We obtained a detailed, 4-generation family history.  Significant diagnoses are listed below: Family History  Problem Relation Age of Onset   Coronary artery disease Mother    Coronary artery disease Father        smoker   Breast cancer Neg Hx    Colon cancer Neg Hx    Kayla Farmer has 2 sons (44 and 35) and a daughter (22). She has 1 paternal half sister in her 53s. None have had cancer.  Kayla Farmer mother is 78 and is not sure if she has had colon polyps before. Patient had 5 maternal uncles, 4 maternal aunts, no cancers for them or for her cousins. Maternal grandmother died at 56, grandfather died at 6.  Kayla Farmer father is 68, no known history of colon polyps. Patient had 5 paternal uncles, 1 aunt, no cancers for them or for her cousins. Paternal grandfather died at 22, grandmother died in her 75s.  Kayla Farmer is unaware of previous family history of genetic testing for hereditary cancer risks. Patient's maternal ancestors are of unknown descent, and paternal ancestors are of unknown descent. There is no reported Ashkenazi Jewish ancestry. There is no known consanguinity.    GENETIC COUNSELING ASSESSMENT: Kayla Farmer is a 55 y.o. female with a personal history of colon polyps which is somewhat suggestive of a hereditary cancer syndrome and predisposition to cancer. We, therefore, discussed and recommended the following at today's visit.   DISCUSSION: We discussed that polyps in general are common, however, most people have fewer than 5 lifetime polyps.   When an individual has 10 or more polyps we become concerned about an underlying polyposis syndrome.  The most common hereditary polyposis syndromes are caused by problems in the APC and MUTYH gene. We discussed that testing is beneficial for several reasons including knowing how to follow individuals for cancer screenings, and understand if other family members could be at risk for cancer and allow them to undergo genetic testing.   We reviewed the characteristics, features and inheritance patterns of hereditary cancer syndromes. We also discussed genetic testing, including the appropriate family members to test, the process of testing, insurance coverage and turn-around-time for results. We discussed the  implications of a negative, positive and/or variant of uncertain significant result. We recommended Kayla Farmer pursue genetic testing for the [facility] Multi-Cancer+RNA gene panel.   The Multi-Cancer Panel + RNA offered by [facility] includes sequencing and/or deletion duplication testing of the following 84 genes: AIP, ALK, APC, ATM, AXIN2,BAP1,  BARD1, BLM, BMPR1A, BRCA1, BRCA2, BRIP1, CASR, CDC73, CDH1, CDK4, CDKN1B, CDKN1C, CDKN2A (p14ARF), CDKN2A (p16INK4a), CEBPA, CHEK2, CTNNA1, DICER1, DIS3L2, EGFR (c.2369C>T, p.Thr790Met variant only), EPCAM (Deletion/duplication testing only), FH, FLCN, GATA2, GPC3, GREM1 (Promoter region deletion/duplication testing only), HOXB13 (c.251G>A, p.Gly84Glu), HRAS, KIT, MAX, MEN1, MET, MITF (c.952G>A, p.Glu318Lys variant only), MLH1, MSH2, MSH3, MSH6, MUTYH, NBN, NF1, NF2, NTHL1, PALB2, PDGFRA, PHOX2B, PMS2, POLD1, POLE, POT1, PRKAR1A, PTCH1, PTEN, RAD50, RAD51C, RAD51D, RB1, RECQL4, RET, RUNX1, SDHAF2, SDHA (sequence changes only), SDHB, SDHC, SDHD, SMAD4, SMARCA4, SMARCB1, SMARCE1, STK11, SUFU, TERC, TERT, TMEM127, TP53, TSC1, TSC2, VHL, WRN and WT1.  Based on Kayla Farmer's personal history of colon polyps, she meets medical criteria for genetic testing. Despite that she  meets criteria, she Kayla Farmer still have an out of pocket cost. We discussed that if her out of pocket cost for testing is over $100, the laboratory will call and confirm whether she wants to proceed with testing.  If the out of pocket cost of testing is less than $100 she will be billed by the genetic testing laboratory.   PLAN: After considering the risks, benefits, and limitations, Kayla Farmer did not wish to pursue genetic testing at today's visit. We understand this decision and remain available to coordinate genetic testing at any time in the future. We, therefore, recommend Kayla Farmer continue to follow the cancer screening guidelines given by her primary healthcare provider.  Kayla Farmer questions were answered to her satisfaction today. Our contact information was provided should additional questions or concerns arise. Thank you for the referral and allowing [place] to share in the care of your patient.   Faith Rogue, MS, [facility] Genetic Counselor [email or web address] Phone: [phone]  The patient was seen for a total of 30 minutes in face-to-face genetic counseling.  Patient was seen with her mother, Kayla Farmer. Dr. Grayland Ormond was available for discussion regarding this case.   _______________________________________________________________________ For Office Staff:  Number of people involved in session: 2 Was an Intern/ student involved with case: no

## 2021-08-03 ENCOUNTER — Other Ambulatory Visit: Payer: Self-pay | Admitting: Cardiovascular Disease

## 2021-08-03 DIAGNOSIS — I25118 Atherosclerotic heart disease of native coronary artery with other forms of angina pectoris: Secondary | ICD-10-CM

## 2021-08-03 DIAGNOSIS — E785 Hyperlipidemia, unspecified: Secondary | ICD-10-CM

## 2021-09-17 ENCOUNTER — Telehealth: Payer: Self-pay | Admitting: Internal Medicine

## 2021-09-17 MED ORDER — BUSPIRONE HCL 30 MG PO TABS: ORAL_TABLET | ORAL | 0 refills | Status: DC

## 2021-09-17 NOTE — Telephone Encounter (Signed)
Medication has been refilled.

## 2021-09-17 NOTE — Telephone Encounter (Signed)
Pt need refill on buspirone sent to [facility] on [address] in [place]

## 2021-10-06 ENCOUNTER — Other Ambulatory Visit: Payer: Self-pay

## 2021-10-06 MED ORDER — METFORMIN HCL ER 500 MG PO TB24: 500.0000 mg | ORAL_TABLET | Freq: Every day | ORAL | 1 refills | Status: DC

## 2021-10-22 ENCOUNTER — Ambulatory Visit: Payer: Medicare Other | Admitting: Cardiovascular Disease

## 2021-10-22 ENCOUNTER — Other Ambulatory Visit: Payer: Self-pay

## 2021-10-22 ENCOUNTER — Encounter: Payer: Self-pay | Admitting: Cardiovascular Disease

## 2021-10-22 VITALS — BP 134/82 | HR 81 | Ht 63.0 in | Wt 209.0 lb

## 2021-10-22 DIAGNOSIS — I2510 Atherosclerotic heart disease of native coronary artery without angina pectoris: Secondary | ICD-10-CM | POA: Diagnosis not present

## 2021-10-22 DIAGNOSIS — Z6837 Body mass index (BMI) 37.0-37.9, adult: Secondary | ICD-10-CM

## 2021-10-22 DIAGNOSIS — E785 Hyperlipidemia, unspecified: Secondary | ICD-10-CM

## 2021-10-22 NOTE — Progress Notes (Signed)
Cardiology Office Note   Date:  10/22/2021   ID:  Kayla, Farmer 1966-05-04, MRN [id number]  PCP:  [name], MD  Cardiologist:   [name], MD   Chief Complaint  Patient presents with   Follow-up    6 Months follow up and medications verbally reviewed with patient.       History of Present Illness: Kayla Farmer is a 56 y.o. female who presents for a follow-up visit regarding coronary artery disease. She has known history of tobacco use, hyperlipidemia, anxiety and family history of coronary artery disease.  She presented in April 2020 with non-ST elevation myocardial infarction.  Cardiac catheterization showed significant two-vessel coronary artery disease with an occluded mid right PDA with left-to-right collaterals.  This was felt to be the culprit but the vessel was small in size and already had collaterals and thus left to be treated medically.  There was significant stenosis in the mid LAD which was treated successfully with PCI and drug-eluting stent placement.  Ejection fraction was 50 to 55%. She quit smoking after her myocardial infarction.  Repeat echocardiogram in December 2020 showed normal LV systolic function with an EF of 55 to 60% with no significant valvular abnormalities. Lower extremity arterial Doppler in December 2020 showed normal ABI and toe pressure and normal waveforms.  She has been doing well with no chest pain, shortness of breath or palpitations.  Biggest issue seems to be inability to lose weight.  She has not been doing any exercise and thus complains of low energy.  Past Medical History:  Diagnosis Date   Anxiety    Arthritis    CAD (coronary artery disease)    a. 12/2018 NSTEMI/PCI: LM nl, LAD 30p, 65m(2.25x22 Resolute Onyx DES), LCX nl, OM2 nl, RCA 489mRPDA 100 w/ L->R collats (likely culprit - med rx). EF 50-55%.   [facility]   [facility]. [facility] - L>R.   Depression    GERD (gastroesophageal reflux disease)    History of  cervical cancer    History of tobacco abuse    a. 30 pack year - quit 12/2018 @ time of NSTEMI.   HLD (hyperlipidemia)    [facility] [place]   Obesity    Other inflammations of eyelids    Positive colorectal cancer screening using Cologuard test 2019   Pre-diabetes    Sinus infection     Past Surgical History:  Procedure Laterality Date   CARDIAC CATHETERIZATION     COLONOSCOPY WITH PROPOFOL N/A 01/24/2018   Procedure: COLONOSCOPY WITH PROPOFOL;  Surgeon: ByRobert BellowMD;  Location: [facility] END[facility]COPY;  Service: Endoscopy;  Laterality: N/A;   COLONOSCOPY WITH PROPOFOL N/A 06/09/2021   Procedure: COLONOSCOPY WITH PROPOFOL;  Surgeon: ByRobert BellowMD;  Location: [facility];  Service: Endoscopy;  Laterality: N/A;   CORONARY STENT INTERVENTION N/A 01/07/2019   Procedure: CORONARY STENT INTERVENTION;  Surgeon: ArWellington HampshireMD;  [place];  Service: Cardiovascular;  Laterality: N/A;  LAD   csection x2     DILATION AND CURETTAGE OF UTERUS     ESOPHAGOGASTRODUODENOSCOPY (EGD) WITH PROPOFOL N/A 06/09/2021   Procedure: ESOPHAGOGASTRODUODENOSCOPY (EGD) WITH PROPOFOL;  Surgeon: ByRobert BellowMD;  [facility];  Service: Endoscopy;  Laterality: N/A;   LEFT HEART CATH AND CORONARY ANGIOGRAPHY N/A 01/07/2019   Procedure: LEFT HEART CATH AND CORONARY ANGIOGRAPHY;  Surgeon: ArWellington HampshireMD;  [place] [facility];  Service:  Cardiovascular;  Laterality: N/A;   LIVER BIOPSY  04/26/2017   TONSILLECTOMY       Current Outpatient Medications  Medication Sig Dispense Refill   aspirin EC 81 MG EC tablet Take 1 tablet (81 mg total) by mouth daily. 30 tablet 0   atorvastatin (LIPITOR) 80 MG tablet TAKE 1 TABLET BY MOUTH DAILY AT 6 PM 90 tablet 0   busPIRone (BUSPAR) 30 MG tablet TAKE 1 TABLET(30 MG) BY MOUTH THREE TIMES DAILY AS NEEDED FOR ANXIETY 90 tablet 0   metoprolol succinate (TOPROL-XL) 25 MG 24 hr tablet TAKE 1 TABLET(25  MG) BY MOUTH DAILY 90 tablet 1   Multiple Vitamins-Minerals (EYE VITAMINS & MINERALS PO) Take 1 tablet by mouth daily at 8 pm.     omeprazole (PRILOSEC) 40 MG capsule TAKE 1 CAPSULE(40 MG) BY MOUTH DAILY 90 capsule 1   venlafaxine XR (EFFEXOR-XR) 75 MG 24 hr capsule TAKE 1 CAPSULE BY MOUTH EVERY DAY 90 capsule 1   metFORMIN (GLUCOPHAGE-XR) 500 MG 24 hr tablet Take 1 tablet (500 mg total) by mouth daily with breakfast. (Patient not taking: Reported on 10/22/2021) 90 tablet 1   No current facility-administered medications for this visit.    Allergies:   Codeine    Social History:  The patient  reports that she quit smoking about 2 years ago. Her smoking use included cigarettes. She has a 30.00 pack-year smoking history. She has never used smokeless tobacco. She reports that she does not drink alcohol and does not use drugs.   Family History:  The patient's family history includes Coronary artery disease in her father and mother.    ROS:  Please see the history of present illness.   Otherwise, review of systems are positive for none.   All other systems are reviewed and negative.    PHYSICAL EXAM: VS:  BP 134/82 (BP Location: Left Arm, Patient Position: Sitting, Cuff Size: Normal)    Pulse 81    Ht 5' 3"  (1.6 m)    Wt 209 lb (94.8 kg)    LMP 10/13/2014 (Approximate)    SpO2 97%    BMI 37.02 kg/m  , BMI Body mass index is 37.02 kg/m. GEN: Well nourished, well developed, in no acute distress  HEENT: normal  Neck: no JVD, carotid bruits, or masses Cardiac: RRR; no murmurs, rubs, or gallops,no edema  Respiratory:  clear to auscultation bilaterally, normal work of breathing GI: soft, nontender, nondistended, + BS MS: no deformity or atrophy  Skin: warm and dry, no rash Neuro:  Strength and sensation are intact Psych: euthymic mood, full affect   EKG:  EKG is ordered today. EKG showed normal sinus rhythm with no significant ST or T wave changes.   Recent Labs: 03/30/2021: ALT 19; BUN  20; Creatinine, Ser 0.87; Hemoglobin 11.0; Platelets 232.0; Potassium 4.2; Sodium 140    Lipid Panel    Component Value Date/Time   CHOL 129 03/30/2021 0833   TRIG 154.0 (H) 03/30/2021 0833   HDL 40.80 03/30/2021 0833   CHOLHDL 3 03/30/2021 0833   VLDL 30.8 03/30/2021 0833   LDLCALC 57 03/30/2021 0833   LDLDIRECT 157.0 11/10/2016 1028      Wt Readings from Last 3 Encounters:  10/22/21 209 lb (94.8 kg)  06/09/21 204 lb 6.6 oz (92.7 kg)  04/16/21 200 lb 6.4 oz (90.9 kg)       No flowsheet data found.    ASSESSMENT AND PLAN:  1.  Coronary artery disease involving native coronary arteries  without angina: She is doing well overall with no symptoms of angina.   Continue aspirin indefinitely.  2.  Hyperlipidemia: I reviewed her most recent labs done in July.  LDL was 57 and triglyceride was 154.  Continue high-dose atorvastatin 80 mg daily.  3.  Obesity with a BMI of 37: The patient gained weight since she quit smoking in 2020.  I had a prolonged discussion with her about the importance of healthy diet and regular exercise.  I recommended at least 30 minutes of exercise daily.   Disposition:   FU with me in 12 months  Signed,  [name], MD  10/22/2021 8:22 AM    [place]

## 2021-10-22 NOTE — Patient Instructions (Signed)
Medication Instructions:  Your physician recommends that you continue on your current medications as directed. Please refer to the Current Medication list given to you today.  *If you need a refill on your cardiac medications before your next appointment, please call your pharmacy*   Lab Work: None ordered If you have labs (blood work) drawn today and your tests are completely normal, you will receive your results only by: [place] (if you have MyChart) OR A paper copy in the mail If you have any lab test that is abnormal or we need to change your treatment, we will call you to review the results.   Testing/Procedures: None ordered   Follow-Up: At [facility], you and your health needs are our priority.  As part of our continuing mission to provide you with exceptional heart care, we have created designated Provider Care Teams.  These Care Teams include your primary Cardiologist (physician) and Advanced Practice Providers (APPs -  Physician Assistants and Nurse Practitioners) who all work together to provide you with the care you need, when you need it.  We recommend signing up for the patient portal called "MyChart".  Sign up information is provided on this After Visit Summary.  MyChart is used to connect with patients for Virtual Visits (Telemedicine).  Patients are able to view lab/test results, encounter notes, upcoming appointments, etc.  Non-urgent messages can be sent to your provider as well.   To learn more about what you can do with MyChart, go to [email or web address].    Your next appointment:   Your physician wants you to follow-up in: 1 year You will receive a reminder letter in the mail two months in advance. If you don't receive a letter, please call our office to schedule the follow-up appointment.   The format for your next appointment:   In Person  Provider:   You may see [name], MD or one of the following Advanced Practice Providers on your  designated Care Team:   Murray Hodgkins, NP Christell Faith, PA-C Cadence Kathlen Mody, [place]   Other Instructions N/A

## 2021-10-25 ENCOUNTER — Other Ambulatory Visit: Payer: Self-pay

## 2021-10-25 ENCOUNTER — Ambulatory Visit (INDEPENDENT_AMBULATORY_CARE_PROVIDER_SITE_OTHER): Payer: Medicare Other | Admitting: Internal Medicine

## 2021-10-25 ENCOUNTER — Encounter: Payer: Self-pay | Admitting: Internal Medicine

## 2021-10-25 VITALS — BP 142/90 | HR 99 | Temp 98.0°F | Ht 63.0 in | Wt 207.8 lb

## 2021-10-25 DIAGNOSIS — E669 Obesity, unspecified: Secondary | ICD-10-CM

## 2021-10-25 DIAGNOSIS — F411 Generalized anxiety disorder: Secondary | ICD-10-CM

## 2021-10-25 DIAGNOSIS — E1169 Type 2 diabetes mellitus with other specified complication: Secondary | ICD-10-CM

## 2021-10-25 DIAGNOSIS — Z1231 Encounter for screening mammogram for malignant neoplasm of breast: Secondary | ICD-10-CM

## 2021-10-25 DIAGNOSIS — E785 Hyperlipidemia, unspecified: Secondary | ICD-10-CM

## 2021-10-25 DIAGNOSIS — Z8659 Personal history of other mental and behavioral disorders: Secondary | ICD-10-CM

## 2021-10-25 LAB — HEMOGLOBIN A1C: Hgb A1c MFr Bld: 6.2 % (ref 4.6–6.5)

## 2021-10-25 NOTE — Patient Instructions (Signed)
I would like to help you overcome your depression/anxiety by having you see a psychiatrist   Please reconsider treatment for your diabetes.     I am recommending that you consider starting a diabetes medication called Jardiance.  Vania Rea is an SLGT 2  Inhibitor .  It works to lower blood sugars by routing the excess sugar through the kidneys into the urine. It does make you urinate more, but it has been nproven to have a  protective effect on  your heart .  It actually lowers  your risk for heart attack   if you have already had heart trouble

## 2021-10-25 NOTE — Assessment & Plan Note (Signed)
Diet controlled, but with significant weight gain,  So she was given a trial of Metformin , which she has stopped as of January  2023 for because it was ineffective in helping her lose weight .  Advised to consider GLP 1 agonist but she has refused. She prefers to control her diabetes with exercise (which she is not doing)  And diet.  Encouraged to start exercising .  Continue atorvastatin and aspirin   Lab Results  Component Value Date   HGBA1C 6.3 03/30/2021    Lab Results  Component Value Date   MICROALBUR 1.9 04/16/2021   MICROALBUR 1.6 03/19/2020

## 2021-10-25 NOTE — Assessment & Plan Note (Signed)
She stopped metformin because "it didn't work"' and declines a trial of GLP 1 agonist,  Even in tablet form.

## 2021-10-25 NOTE — Progress Notes (Signed)
Subjective:  Patient ID: Kayla Farmer, female    DOB: 11/17/1965  Age: 56 y.o. MRN: [id number]  CC: The primary encounter diagnosis was Type 2 diabetes mellitus with obesity ([place]). Diagnoses of Encounter for screening mammogram for malignant neoplasm of breast, Hyperlipidemia LDL goal <70, Generalized anxiety disorder, History of learning disability as a child, and Obesity (BMI 30.0-34.9) were also pertinent to this visit.   This visit occurred during the SARS-CoV-2 public health emergency.  Safety protocols were in place, including screening questions prior to the visit, additional usage of staff PPE, and extensive cleaning of exam room while observing appropriate contact time as indicated for disinfecting solutions.    HPI ELIZABETHANN LACKEY presents for  Chief Complaint  Patient presents with   Follow-up    52mof/u for DM, Anemia, HTN   1) T2DM: diagnosed in 2021.  Complicated by obesity and hypertension Diet controlled thus far.    Personal best weight was 180 lbs in 2018.  Currently not exercising due to the cold weather. Used to walk daily .  Discussed use of Ozempic ,  she doesn't want to use an injection  or even Rybelsis.  Does not check at home  but BP was lower at cardiologist office . Has stopped taking metformin because it wasn't helping her lose weight,  and  does not want to start FIranor Jardiance. Does not believe she has diabetes (it's because I'm lazy") . Goes to [employer]  2) Depression:  chronic. .complicated by anxiety.  Was doing fine this morning at home  started crying as soon as I enter the room.  Has been crying in office visits for the past 10 years,  has never been able to hold a job because she cries during her work for no apparent reason.  She was born to a young mother ,with a learning disability,  was  placed in foster home for a few years,  then PSpraywent to court to gain custody at the age of 89. raised by her grandmother,  who loved her very much.  She didn't do  well in school,  told she had a comprehension problem,  Quit school in the 9th grade.  Medication has not helped despite use of lexapro,  now on  effexor and buspirone.  Bothered by the  caused weight gain.  Gets nervous in confined public services.  Cries "like a baby" in church so doesn't go.  Embarrassed by her crying. Has never seen a psychiatrist or a cRetail bankerand still not liking the idea "what if it doesn't work "  children are all grown and out of the home  just her and KMaudie Mercury(husband).  Marriage is not one of love,  they exist as roommates in the house.      Outpatient Medications Prior to Visit  Medication Sig Dispense Refill   Multiple Vitamins-Minerals (PRESERVISION AREDS 2+MULTI VIT) CAPS Take by mouth.     aspirin EC 81 MG EC tablet Take 1 tablet (81 mg total) by mouth daily. (Patient not taking: Reported on 10/25/2021) 30 tablet 0   atorvastatin (LIPITOR) 80 MG tablet TAKE 1 TABLET BY MOUTH DAILY AT 6 PM 90 tablet 0   busPIRone (BUSPAR) 30 MG tablet TAKE 1 TABLET(30 MG) BY MOUTH THREE TIMES DAILY AS NEEDED FOR ANXIETY 90 tablet 0   clindamycin (CLEOCIN) 150 MG capsule Take 150 mg by mouth 4 (four) times daily.     metoprolol succinate (TOPROL-XL) 25 MG  24 hr tablet TAKE 1 TABLET(25 MG) BY MOUTH DAILY 90 tablet 1   Multiple Vitamins-Minerals (EYE VITAMINS & MINERALS PO) Take 1 tablet by mouth daily at 8 pm. (Patient not taking: Reported on 10/25/2021)     omeprazole (PRILOSEC) 40 MG capsule TAKE 1 CAPSULE(40 MG) BY MOUTH DAILY 90 capsule 1   venlafaxine XR (EFFEXOR-XR) 75 MG 24 hr capsule TAKE 1 CAPSULE BY MOUTH EVERY DAY 90 capsule 1   metFORMIN (GLUCOPHAGE-XR) 500 MG 24 hr tablet Take 1 tablet (500 mg total) by mouth daily with breakfast. (Patient not taking: Reported on 10/22/2021) 90 tablet 1   No facility-administered medications prior to visit.    Review of Systems;  Patient denies headache, fevers, malaise, unintentional weight loss, skin rash, eye pain, sinus congestion and  sinus pain, sore throat, dysphagia,  hemoptysis , cough, dyspnea, wheezing, chest pain, palpitations, orthopnea, edema, abdominal pain, nausea, melena, diarrhea, constipation, flank pain, dysuria, hematuria, urinary  Frequency, nocturia, numbness, tingling, seizures,  Focal weakness, Loss of consciousness,  Tremor, insomnia, depression, anxiety, and suicidal ideation.      Objective:  BP (!) 142/90 (BP Location: Left Arm, Patient Position: Sitting, Cuff Size: Large)    Pulse 99    Temp 98 F (36.7 C) (Oral)    Ht 5' 3"  (1.6 m)    Wt 207 lb 12.8 oz (94.3 kg)    LMP 10/13/2014 (Approximate)    SpO2 94%    BMI 36.81 kg/m   BP Readings from Last 3 Encounters:  10/25/21 (!) 142/90  10/22/21 134/82  06/09/21 98/64    Wt Readings from Last 3 Encounters:  10/25/21 207 lb 12.8 oz (94.3 kg)  10/22/21 209 lb (94.8 kg)  06/09/21 204 lb 6.6 oz (92.7 kg)    General appearance: alert, cooperative and appears stated age Ears: normal TM's and external ear canals both ears Throat: lips, mucosa, and tongue normal; teeth and gums normal Neck: no adenopathy, no carotid bruit, supple, symmetrical, trachea midline and thyroid not enlarged, symmetric, no tenderness/mass/nodules Back: symmetric, no curvature. ROM normal. No CVA tenderness. Lungs: clear to auscultation bilaterally Heart: regular rate and rhythm, S1, S2 normal, no murmur, click, rub or gallop Abdomen: soft, non-tender; bowel sounds normal; no masses,  no organomegaly Pulses: 2+ and symmetric Skin: Skin color, texture, turgor normal. No rashes or lesions Lymph nodes: Cervical, supraclavicular, and axillary nodes normal.  Lab Results  Component Value Date   HGBA1C 6.3 03/30/2021   HGBA1C 6.5 03/19/2020   HGBA1C 6.3 02/13/2019    Lab Results  Component Value Date   CREATININE 0.87 03/30/2021   CREATININE 0.91 03/19/2020   CREATININE 0.92 02/13/2019    Lab Results  Component Value Date   WBC 9.3 03/30/2021   HGB 11.0 (L)  03/30/2021   HCT 34.4 (L) 03/30/2021   PLT 232.0 03/30/2021   GLUCOSE 99 03/30/2021   CHOL 129 03/30/2021   TRIG 154.0 (H) 03/30/2021   HDL 40.80 03/30/2021   LDLDIRECT 157.0 11/10/2016   LDLCALC 57 03/30/2021   ALT 19 03/30/2021   AST 19 03/30/2021   NA 140 03/30/2021   K 4.2 03/30/2021   CL 104 03/30/2021   CREATININE 0.87 03/30/2021   BUN 20 03/30/2021   CO2 27 03/30/2021   TSH 1.93 03/19/2020   INR 1.0 01/07/2019   HGBA1C 6.3 03/30/2021   MICROALBUR 1.9 04/16/2021    No results found.  Assessment & Plan:   Problem List Items Addressed This Visit  Generalized anxiety disorder   Relevant Orders   Ambulatory referral to Psychiatry   History of learning disability as a child    Untreated,  With patient quitting school in the 9th grade.  Her current symptoms of anxiety and uncontrolled crying appear to have started as a child  Yet she has refused psychiatric counselling until now.  Continue effexor and buspirone. Referring to Dr Nicolasa Ducking       Obesity (BMI 30.0-34.9)    She stopped metformin because "it didn't work"' and declines a trial of GLP 1 agonist,  Even in tablet form.       Screening for breast cancer   Relevant Orders   MM 3D SCREEN BREAST BILATERAL   Type 2 diabetes mellitus with obesity ([place]) - Primary    Diet controlled, but with significant weight gain,  So she was given a trial of Metformin , which she has stopped as of January  2023 for because it was ineffective in helping her lose weight .  Advised to consider GLP 1 agonist but she has refused. She prefers to control her diabetes with exercise (which she is not doing)  And diet.  Encouraged to start exercising .  Continue atorvastatin and aspirin   Lab Results  Component Value Date   HGBA1C 6.3 03/30/2021    Lab Results  Component Value Date   MICROALBUR 1.9 04/16/2021   MICROALBUR 1.6 03/19/2020           Relevant Orders   HgB A1c   Comp Met (CMET)   Other Visit Diagnoses      Hyperlipidemia LDL goal <70       Relevant Orders   Lipid Profile       I spent 30 minutes dedicated to the care of this patient on the date of this encounter to include pre-visit review of patient's medical history,  most recent imaging studies, Face-to-face time with the patient , and post visit ordering of testing and therapeutics.    Follow-up: Return in about 6 months (around 04/24/2022).   [name], MD

## 2021-10-25 NOTE — Assessment & Plan Note (Signed)
Untreated,  With patient quitting school in the 9th grade.  Her current symptoms of anxiety and uncontrolled crying appear to have started as a child  Yet she has refused psychiatric counselling until now.  Continue effexor and buspirone. Referring to Dr Nicolasa Ducking

## 2021-10-26 LAB — COMPREHENSIVE METABOLIC PANEL
ALT: 22 U/L (ref 0–35)
AST: 22 U/L (ref 0–37)
Albumin: 4.3 g/dL (ref 3.5–5.2)
Alkaline Phosphatase: 176 U/L — ABNORMAL HIGH (ref 39–117)
BUN: 23 mg/dL (ref 6–23)
CO2: 32 mEq/L (ref 19–32)
Calcium: 9.3 mg/dL (ref 8.4–10.5)
Chloride: 103 mEq/L (ref 96–112)
Creatinine, Ser: 0.94 mg/dL (ref 0.40–1.20)
GFR: 67.2 mL/min (ref >60.00)
Glucose, Bld: 96 mg/dL (ref 70–99)
Potassium: 4.3 mEq/L (ref 3.5–5.1)
Sodium: 139 mEq/L (ref 135–145)
Total Bilirubin: 0.3 mg/dL (ref 0.2–1.2)
Total Protein: 7.2 g/dL (ref 6.0–8.3)

## 2021-10-26 LAB — LIPID PANEL
Cholesterol: 163 mg/dL (ref 0–200)
HDL: 50.5 mg/dL (ref >39.00)
LDL Cholesterol: 86 mg/dL (ref 0–99)
NonHDL: 112.57
Total CHOL/HDL Ratio: 3
Triglycerides: 134 mg/dL (ref 0.0–149.0)
VLDL: 26.8 mg/dL (ref 0.0–40.0)

## 2021-10-27 ENCOUNTER — Telehealth: Payer: Self-pay

## 2021-10-27 NOTE — Telephone Encounter (Signed)
See result note message 

## 2021-10-27 NOTE — Telephone Encounter (Signed)
-----   Message from [name], MD sent at 10/27/2021  6:55 AM EST ----- Regarding: FW: referral Abdoulie Tierce,  Please let patient know that  Dr Nicolasa Ducking does not take her insurance,  but she can see one of the counsellors at [facility] and she does not need a referral to see them .  They are open M W and F from 8 to 5  and can just walk in.  Please provide her the address  ----- Message ----- From: Ashley Jacobs Sent: 10/26/2021   1:26 PM EST To: [name], MD Subject: RE: referral                                   Yes for [facility] no referral is needed. Pt can walk in MWF 8-5. Crisis M-F anytime  ----- Message ----- From: [name], MD Sent: 10/25/2021   4:40 PM EST To: Ashley Jacobs Subject: RE: referral                                   Darn it!  Is [facility] an option? ----- Message ----- From: Ashley Jacobs Sent: 10/25/2021  10:24 AM EST To: [name], MD, Adair Laundry, CMA Subject: referral                                       Good morning!  Dr Nicolasa Ducking does not except [facility] medicare. Please advise what other facility you would like pt to go to? Thank you!

## 2021-10-27 NOTE — Telephone Encounter (Signed)
Pt returning call. Pt requesting callback.  °

## 2021-10-27 NOTE — Telephone Encounter (Signed)
Spoke with pt to let her know that Dr. Nicolasa Ducking does not accept her insurance and let her know that she can be seen at [facility] without an appt. Pt gave a verbal understanding.

## 2021-10-27 NOTE — Telephone Encounter (Signed)
-----   Message from [name], MD sent at 10/27/2021  7:23 AM EST ----- Your diabetes remains under excellent control  And your cholesterol Is at goal but your liver is still inflamed.  Over time your fatty liver will lead to cirrhosis and liver failure if you do not get it under control.  The medications I suggested that you declined can help ,  please reconsider and schedule another appt if you would like to review and make sure you are seeing your eye doctor at least once a year for a dilated retina exam to monitor for diabetic retinopathy,. changes that can lead to blindness .   Regards,   Deborra Medina, MD

## 2021-10-27 NOTE — Telephone Encounter (Signed)
LM FOR PT TO CB RE: RESULTS

## 2021-11-04 ENCOUNTER — Other Ambulatory Visit: Payer: Self-pay | Admitting: Cardiovascular Disease

## 2021-11-04 DIAGNOSIS — E785 Hyperlipidemia, unspecified: Secondary | ICD-10-CM

## 2021-11-04 DIAGNOSIS — I25118 Atherosclerotic heart disease of native coronary artery with other forms of angina pectoris: Secondary | ICD-10-CM

## 2021-11-08 ENCOUNTER — Telehealth: Payer: Self-pay | Admitting: Internal Medicine

## 2021-11-08 MED ORDER — VENLAFAXINE HCL ER 75 MG PO CP24: ORAL_CAPSULE | ORAL | 1 refills | Status: DC

## 2021-11-08 NOTE — Telephone Encounter (Signed)
Patient called and is requesting a refill on her venlafaxine XR (EFFEXOR-XR) 75 MG 24 hr capsule.

## 2021-11-08 NOTE — Telephone Encounter (Signed)
Medication has been refilled.

## 2021-11-23 ENCOUNTER — Other Ambulatory Visit: Payer: Self-pay | Admitting: Family

## 2021-11-23 DIAGNOSIS — E785 Hyperlipidemia, unspecified: Secondary | ICD-10-CM

## 2021-11-23 DIAGNOSIS — I25118 Atherosclerotic heart disease of native coronary artery with other forms of angina pectoris: Secondary | ICD-10-CM

## 2021-12-01 ENCOUNTER — Telehealth: Payer: Self-pay | Admitting: Internal Medicine

## 2021-12-01 NOTE — Telephone Encounter (Signed)
Copied from [place] [phone]. Topic: Medicare AWV ?>> Dec 01, 2021 10:14 AM Harris-Coley, Hannah Beat wrote: ?Reason for CRM: Left message for patient to schedule Annual Wellness Visit.  Please schedule with Nurse Health Advisor Denisa O'Brien-Blaney, LPN at [facility].  Please call [phone] ask for Juliann Pulse ?

## 2021-12-29 ENCOUNTER — Other Ambulatory Visit: Payer: Self-pay | Admitting: Internal Medicine

## 2021-12-29 LAB — HM HEPATITIS C SCREENING LAB: HM Hepatitis Screen: NEGATIVE

## 2021-12-29 NOTE — Telephone Encounter (Signed)
Pt need refill on omeprazole sent to [facility]  ?

## 2022-01-17 ENCOUNTER — Other Ambulatory Visit: Payer: Self-pay

## 2022-01-17 ENCOUNTER — Other Ambulatory Visit: Payer: Self-pay | Admitting: Internal Medicine

## 2022-01-25 ENCOUNTER — Telehealth: Payer: Self-pay | Admitting: Internal Medicine

## 2022-01-25 NOTE — Telephone Encounter (Signed)
Copied from [place] [phone]. Topic: Medicare AWV ?>> Jan 25, 2022  9:36 AM Harris-Coley, Hannah Beat wrote: ?Reason for CRM: Left message for patient to schedule Annual Wellness Visit.  Please schedule with Nurse Health Advisor Denisa O'Brien-Blaney, LPN at [facility].  Please call [phone] ask for Juliann Pulse ?

## 2022-02-28 ENCOUNTER — Ambulatory Visit
Admission: RE | Admit: 2022-02-28 | Discharge: 2022-02-28 | Disposition: A | Discharge status: Home / Self Care | Payer: Medicare Other | Source: Ambulatory Visit | Attending: Internal Medicine | Admitting: Internal Medicine

## 2022-02-28 DIAGNOSIS — Z1231 Encounter for screening mammogram for malignant neoplasm of breast: Secondary | ICD-10-CM | POA: Insufficient documentation

## 2022-03-09 ENCOUNTER — Other Ambulatory Visit: Payer: Self-pay | Admitting: Internal Medicine

## 2022-03-21 ENCOUNTER — Other Ambulatory Visit: Payer: Self-pay | Admitting: Internal Medicine

## 2022-04-11 ENCOUNTER — Telehealth: Payer: Self-pay | Admitting: Internal Medicine

## 2022-04-11 NOTE — Telephone Encounter (Signed)
Copied from [place] [phone]. Topic: Medicare AWV >> Apr 11, 2022  1:47 PM Devoria Glassing wrote: Reason for CRM: Left message for patient to schedule Annual Wellness Visit.  Please schedule with Nurse Health Advisor Denisa O'Brien-Blaney, LPN at [facility]. This appt can be telephone or office visit.  Please call [phone] ask for [facility]

## 2022-04-25 ENCOUNTER — Ambulatory Visit: Payer: Medicare Other | Admitting: Internal Medicine

## 2022-05-02 ENCOUNTER — Encounter: Payer: Self-pay | Admitting: Internal Medicine

## 2022-05-02 ENCOUNTER — Ambulatory Visit (INDEPENDENT_AMBULATORY_CARE_PROVIDER_SITE_OTHER): Payer: Medicare Other | Admitting: Internal Medicine

## 2022-05-02 ENCOUNTER — Telehealth: Payer: Self-pay

## 2022-05-02 VITALS — BP 122/72 | HR 91 | Temp 98.2°F | Ht 63.0 in | Wt 209.4 lb

## 2022-05-02 DIAGNOSIS — I25118 Atherosclerotic heart disease of native coronary artery with other forms of angina pectoris: Secondary | ICD-10-CM | POA: Diagnosis not present

## 2022-05-02 DIAGNOSIS — D126 Benign neoplasm of colon, unspecified: Secondary | ICD-10-CM | POA: Diagnosis not present

## 2022-05-02 DIAGNOSIS — I739 Peripheral vascular disease, unspecified: Secondary | ICD-10-CM

## 2022-05-02 DIAGNOSIS — E785 Hyperlipidemia, unspecified: Secondary | ICD-10-CM

## 2022-05-02 DIAGNOSIS — E669 Obesity, unspecified: Secondary | ICD-10-CM | POA: Diagnosis not present

## 2022-05-02 DIAGNOSIS — E1169 Type 2 diabetes mellitus with other specified complication: Secondary | ICD-10-CM

## 2022-05-02 DIAGNOSIS — K7581 Nonalcoholic steatohepatitis (NASH): Secondary | ICD-10-CM

## 2022-05-02 DIAGNOSIS — F411 Generalized anxiety disorder: Secondary | ICD-10-CM

## 2022-05-02 LAB — LDL CHOLESTEROL, DIRECT: Direct LDL: 66 mg/dL

## 2022-05-02 LAB — COMPREHENSIVE METABOLIC PANEL
ALT: 19 U/L (ref 0–35)
AST: 23 U/L (ref 0–37)
Albumin: 4 g/dL (ref 3.5–5.2)
Alkaline Phosphatase: 192 U/L — ABNORMAL HIGH (ref 39–117)
BUN: 21 mg/dL (ref 6–23)
CO2: 24 mEq/L (ref 19–32)
Calcium: 9.3 mg/dL (ref 8.4–10.5)
Chloride: 104 mEq/L (ref 96–112)
Creatinine, Ser: 0.82 mg/dL (ref 0.40–1.20)
GFR: 78.89 mL/min (ref >60.00)
Glucose, Bld: 97 mg/dL (ref 70–99)
Potassium: 4.2 mEq/L (ref 3.5–5.1)
Sodium: 140 mEq/L (ref 135–145)
Total Bilirubin: 0.6 mg/dL (ref 0.2–1.2)
Total Protein: 6.9 g/dL (ref 6.0–8.3)

## 2022-05-02 LAB — HEMOGLOBIN A1C: Hgb A1c MFr Bld: 6.5 % (ref 4.6–6.5)

## 2022-05-02 LAB — LIPID PANEL
Cholesterol: 132 mg/dL (ref 0–200)
HDL: 42.1 mg/dL (ref >39.00)
LDL Cholesterol: 57 mg/dL (ref 0–99)
NonHDL: 89.96
Total CHOL/HDL Ratio: 3
Triglycerides: 166 mg/dL — ABNORMAL HIGH (ref 0.0–149.0)
VLDL: 33.2 mg/dL (ref 0.0–40.0)

## 2022-05-02 LAB — MICROALBUMIN / CREATININE URINE RATIO
Creatinine,U: 146.4 mg/dL
Microalb Creat Ratio: 0.9 mg/g (ref 0.0–30.0)
Microalb, Ur: 1.3 mg/dL (ref 0.0–1.9)

## 2022-05-02 MED ORDER — MOMETASONE FUROATE 0.1 % EX CREA: TOPICAL_CREAM | CUTANEOUS | 1 refills | Status: DC

## 2022-05-02 NOTE — Assessment & Plan Note (Signed)
Managed with diet per patient preferene. In spite of fatty liver and CADm she refuses metformin and Jardiance

## 2022-05-02 NOTE — Assessment & Plan Note (Addendum)
She is asymptomatic and remains tobacco free.  Last OV with Arida reviewed Feb 2023 . She declines trial of Jardiance despite discussion of the benefits,  She is taking her statin

## 2022-05-02 NOTE — Telephone Encounter (Signed)
Patient states she is following-up to see if we have sent in a prescription for a cream to help her ear that is itching.  *Patient states her preferred pharmacy is [facility] on [address].

## 2022-05-02 NOTE — Patient Instructions (Addendum)
You have fatty liver.  This will lead to cirrhosis over time, WHICH LEADS TO LIVER FAILURE  (SEE BELOW)  1) LOSE 20 LBS THIS YEAR   2) Vitamin E 800 Iu's daily   3) NO ALCOHOL   4) KEEP DIABETES UNDER CONTROL  PLEASE CONSIDER STARTING JARDIANCE TO PROTECT YOUR HEART   PLEASE  RECONSIDER GETTING THE PNEUMONIA VACCINE

## 2022-05-02 NOTE — Progress Notes (Signed)
Subjective:  Patient ID: Kayla Farmer, female    DOB: May 04, 1966  Age: 56 y.o. MRN: [id number]  CC: The primary encounter diagnosis was Type 2 diabetes mellitus with obesity ([place]). Diagnoses of Hyperlipidemia LDL goal <70, Claudication in peripheral vascular disease ([place]), Coronary artery disease of native artery of native heart with stable angina pectoris ([place]), Generalized anxiety disorder, Serrated polyposis syndrome, and NASH (nonalcoholic steatohepatitis) were also pertinent to this visit.   HPI Kayla Farmer presents for  Chief Complaint  Patient presents with   Follow-up    6 month follow up   1) T2DM:  currently managed with diet only due to personal preference. Randomly checking sugars .  And 130 is the average.  .   Exercising occasionally   2) HTN:  Patient is taking her medications as prescribed and notes no adverse effects.  Home BP readings have been done about once per week and are  generally < 130/80 .  She is avoiding added salt in her diet and walking regularly about 3 times per week for exercise  .   3) Depression/GAD: at last visit she was referred to Dr Nicolasa Ducking for treatment failure and complicated history of being raised by PGM, dropped out of HS due to learning disability etc .unfortunately she was unable to see Dr Nicolasa Ducking bc of insurance non coverage, and was advised to self refer to [place] ,  who has a walk in clinic .  I asked her today if she sought any counselling .  She does not feel that she needs therapy at this time. Denies symptoms of depression.   Looking forward to a huge family vacation in [place] .  Just had a huge yard sale. She is proud of her 3 sons who are all gainfully employed. One is married , hs one grandchild.   She begins crying tears of gratitude when talking about her kids.  4)    Outpatient Medications Prior to Visit  Medication Sig Dispense Refill   aspirin EC 81 MG EC tablet Take 1 tablet (81 mg total) by mouth daily. 30 tablet 0   atorvastatin  (LIPITOR) 80 MG tablet TAKE 1 TABLET BY MOUTH DAILY AT 6 PM 90 tablet 3   busPIRone (BUSPAR) 30 MG tablet TAKE 1 TABLET(30 MG) BY MOUTH THREE TIMES DAILY AS NEEDED FOR ANXIETY 90 tablet 1   Multiple Vitamins-Minerals (PRESERVISION AREDS 2+MULTI VIT) CAPS Take by mouth.     omeprazole (PRILOSEC) 40 MG capsule TAKE 1 CAPSULE(40 MG) BY MOUTH DAILY 90 capsule 1   venlafaxine XR (EFFEXOR-XR) 75 MG 24 hr capsule TAKE 1 CAPSULE BY MOUTH EVERY DAY 90 capsule 1   clindamycin (CLEOCIN) 150 MG capsule Take 150 mg by mouth 4 (four) times daily. (Patient not taking: Reported on 05/02/2022)     metoprolol succinate (TOPROL-XL) 25 MG 24 hr tablet TAKE 1 TABLET(25 MG) BY MOUTH DAILY (Patient not taking: Reported on 05/02/2022) 90 tablet 3   Multiple Vitamins-Minerals (EYE VITAMINS & MINERALS PO) Take 1 tablet by mouth daily at 8 pm. (Patient not taking: Reported on 10/25/2021)     No facility-administered medications prior to visit.    Review of Systems;  Patient denies headache, fevers, malaise, unintentional weight loss, skin rash, eye pain, sinus congestion and sinus pain, sore throat, dysphagia,  hemoptysis , cough, dyspnea, wheezing, chest pain, palpitations, orthopnea, edema, abdominal pain, nausea, melena, diarrhea, constipation, flank pain, dysuria, hematuria, urinary  Frequency, nocturia, numbness, tingling, seizures,  Focal weakness, Loss  of consciousness,  Tremor, insomnia, depression, anxiety, and suicidal ideation.      Objective:  BP 122/72 (BP Location: Left Arm, Patient Position: Sitting, Cuff Size: Large)   Pulse 91   Temp 98.2 F (36.8 C) (Oral)   Ht 5' 3"  (1.6 m)   Wt 209 lb 6.4 oz (95 kg)   LMP 10/13/2014 (Approximate)   SpO2 94%   BMI 37.09 kg/m   BP Readings from Last 3 Encounters:  05/02/22 122/72  10/25/21 (!) 142/90  10/22/21 134/82    Wt Readings from Last 3 Encounters:  05/02/22 209 lb 6.4 oz (95 kg)  10/25/21 207 lb 12.8 oz (94.3 kg)  10/22/21 209 lb (94.8 kg)     General appearance: alert, cooperative and appears stated age Ears: normal TM's and external ear canals both ears Throat: lips, mucosa, and tongue normal; teeth and gums normal Neck: no adenopathy, no carotid bruit, supple, symmetrical, trachea midline and thyroid not enlarged, symmetric, no tenderness/mass/nodules Back: symmetric, no curvature. ROM normal. No CVA tenderness. Lungs: clear to auscultation bilaterally Heart: regular rate and rhythm, S1, S2 normal, no murmur, click, rub or gallop Abdomen: soft, non-tender; bowel sounds normal; no masses,  no organomegaly Pulses: 2+ and symmetric Skin: Skin color, texture, turgor normal. No rashes or lesions Lymph nodes: Cervical, supraclavicular, and axillary nodes normal.  Lab Results  Component Value Date   HGBA1C 6.2 10/25/2021   HGBA1C 6.3 03/30/2021   HGBA1C 6.5 03/19/2020    Lab Results  Component Value Date   CREATININE 0.94 10/25/2021   CREATININE 0.87 03/30/2021   CREATININE 0.91 03/19/2020    Lab Results  Component Value Date   WBC 9.3 03/30/2021   HGB 11.0 (L) 03/30/2021   HCT 34.4 (L) 03/30/2021   PLT 232.0 03/30/2021   GLUCOSE 96 10/25/2021   CHOL 163 10/25/2021   TRIG 134.0 10/25/2021   HDL 50.50 10/25/2021   LDLDIRECT 157.0 11/10/2016   LDLCALC 86 10/25/2021   ALT 22 10/25/2021   AST 22 10/25/2021   NA 139 10/25/2021   K 4.3 10/25/2021   CL 103 10/25/2021   CREATININE 0.94 10/25/2021   BUN 23 10/25/2021   CO2 32 10/25/2021   TSH 1.93 03/19/2020   INR 1.0 01/07/2019   HGBA1C 6.2 10/25/2021   MICROALBUR 1.9 04/16/2021    MM 3D SCREEN BREAST BILATERAL  Result Date: 03/01/2022 CLINICAL DATA:  Screening. EXAM: DIGITAL SCREENING BILATERAL MAMMOGRAM WITH TOMOSYNTHESIS AND CAD TECHNIQUE: Bilateral screening digital craniocaudal and mediolateral oblique mammograms were obtained. Bilateral screening digital breast tomosynthesis was performed. The images were evaluated with computer-aided detection.  COMPARISON:  Previous exam(s). ACR Breast Density Category c: The breast tissue is heterogeneously dense, which may obscure small masses. FINDINGS: There are no findings suspicious for malignancy. IMPRESSION: No mammographic evidence of malignancy. A result letter of this screening mammogram will be mailed directly to the patient. RECOMMENDATION: Screening mammogram in one year. (Code:SM-B-01Y) BI-RADS CATEGORY  1: Negative. Electronically Signed   By: Margarette Canada M.D.   On: 03/01/2022 13:39    Assessment & Plan:   Problem List Items Addressed This Visit     RESOLVED: Claudication in peripheral vascular disease ([place])   Coronary artery disease of native artery of native heart with stable angina pectoris ([place])    She is asymptomatic and remains tobacco free.  Last OV with Arida reviewed Feb 2023 . She declines trial of Jardiance despite discussion of the benefits,  She is taking her statin  Generalized anxiety disorder    Chronic,  Acquired in adolescence.  She declines change in  treatment and referral to psychiatry       NASH (nonalcoholic steatohepatitis)    She refuses metformin and is alrady taking a statin . Adding vitamin e.  Strongly encouraged to lose 10% of body weight this year       Serrated polyposis syndrome    Suggested by 2022 colonoscopy.  Annual colonoscopy may be required.      Type 2 diabetes mellitus with obesity ([place]) - Primary    Managed with diet per patient preferene. In spite of fatty liver and CADm she refuses metformin and Jardiance       Relevant Orders   Comp Met (CMET)   HgB A1c   Urine Microalbumin w/creat. ratio   Other Visit Diagnoses     Hyperlipidemia LDL goal <70       Relevant Orders   Lipid Profile   Direct LDL       I spent a total of  38 minutes with this patient in a face to face visit on the date of this encounter reviewing the last office visit with me in February, most recent with patient's cardiologist in  February,  last  colonoscopy Sept 2022.   ,  patient's diet and eating habits, ,  most recent imammogram , and post visit ordering of testing and therapeutics.    Follow-up: No follow-ups on file.   [name], MD

## 2022-05-02 NOTE — Assessment & Plan Note (Signed)
Suggested by 2022 colonoscopy.  Annual colonoscopy may be required.

## 2022-05-02 NOTE — Telephone Encounter (Signed)
Attempted to call. No answer no voicemail.

## 2022-05-02 NOTE — Telephone Encounter (Signed)
Oops!  Mometasone was promised but not sent.  I have take care of it

## 2022-05-02 NOTE — Assessment & Plan Note (Signed)
She refuses metformin and is alrady taking a statin . Adding vitamin e.  Strongly encouraged to lose 10% of body weight this year

## 2022-05-02 NOTE — Assessment & Plan Note (Addendum)
Chronic,  Acquired in adolescence.  She declines change in  treatment and referral to psychiatry

## 2022-05-05 NOTE — Telephone Encounter (Signed)
Pt is aware that medication has been sent in.

## 2022-06-21 DIAGNOSIS — Z86010 Personal history of colonic polyps: Secondary | ICD-10-CM | POA: Diagnosis not present

## 2022-06-23 ENCOUNTER — Other Ambulatory Visit: Payer: Self-pay | Admitting: General Surgery

## 2022-06-23 ENCOUNTER — Telehealth: Payer: Self-pay | Admitting: Internal Medicine

## 2022-06-23 NOTE — Telephone Encounter (Signed)
Copied from [place] [phone]. Topic: Medicare AWV >> Jun 23, 2022  2:23 PM Jae Dire wrote: Reason for CRM:  Left message for patient to call back and schedule Medicare Annual Wellness Visit (AWV) in office.   If unable to come into the office for AWV,  please offer to do virtually or by telephone.  Last AWV: 04/27/2020  Please schedule at anytime with [place].  30 minute appointment for Virtual or phone 45 minute appointment for in office or Initial virtual/phone  Any questions, please contact me at [phone]

## 2022-06-23 NOTE — Progress Notes (Signed)
Progress Notes - documented in this encounter Anayeli Arel, Geronimo Boot, MD - 06/21/2022 10:30 AM EDT Formatting of this note is different from the original. Subjective:   Patient ID: Kayla Farmer is a 56 y.o. female.  HPI  The following portions of the patient's history were reviewed and updated as appropriate.  This an established patient is here today for: office visit. Here to discuss having a colonoscopy, last completed on 06-09-21. She states her bowels move regular and no bleeding.  Review of Systems  Constitutional: Negative for chills and fever.  Respiratory: Negative for cough.   Chief Complaint  Patient presents with  Pre-op Exam    BP 124/66  Pulse 92  Temp 36.7 C (98 F)  Ht 160 cm (5' 3" )  Wt 96.2 kg (212 lb)  LMP 09/12/2013 (LMP Unknown)  SpO2 95%  BMI 37.55 kg/m   Past Medical History:  Diagnosis Date  Anxiety  Arthritis  Atherosclerosis of coronary artery of native heart with unstable angina pectoris (CMS-HCC)  Depression  Fatty liver  Genetic testing 07/06/2021  Patient declined genetic testing after evaluation with Genetics office.  GERD (gastroesophageal reflux disease)  History of recurrent ear infection  History of tobacco abuse  Hx of cervical cancer  Hyperlipidemia  Myocardial infarct (CMS-HCC) 2020  Positive colorectal cancer screening using Cologuard test 2019  Prediabetes    Past Surgical History:  Procedure Laterality Date  LIVER BIOPSY 04/26/2017  COLONOSCOPY 01/2018  coronary stent intervention 01/07/2019  left hearty cath and coronary angiography 01/07/2019  EGD 06/09/2021  Gastric polyps/No specimens collected/Repeat as needed/JWB  COLONOSCOPY 06/09/2021  Sessile serrated polyps/PHx CP/Repeat 52yrJWB  CESAREAN SECTION  x2  DILATION AND CURETTAGE OF UTERUS  GYNECOLOGIC CRYOSURGERY  for pre-cancerous cells  TONSILLECTOMY    OB History   Gravida  4  Para  3  Term  3  Preterm   AB  1  Living  3    SAB  1   IAB   Ectopic   Molar   Multiple   Live Births  3    Obstetric Comments  Age at first period 89Age of first pregnancy [age]     Social History   Socioeconomic History  Marital status: Married  Occupational History  Occupation: SAHM  Tobacco Use  Smoking status: Former  Packs/day: 1.00  Years: 30.00  Additional pack years: 0.00  Total pack years: 30.00  Types: Cigarettes  Start date: 01/06/2019  Smokeless tobacco: Never  Vaping Use  Vaping Use: Never used  Substance and Sexual Activity  Alcohol use: Not Currently  Drug use: Never  Sexual activity: Yes  Birth control/protection: Post-menopausal    Allergies  Allergen Reactions  Codeine Itching   Current Outpatient Medications  Medication Sig Dispense Refill  aspirin 81 MG EC tablet Take 81 mg by mouth once daily  atorvastatin (LIPITOR) 80 MG tablet Take 80 mg by mouth once daily  busPIRone (BUSPAR) 30 MG tablet Take 30 mg by mouth once daily  clindamycin (CLEOCIN) 150 MG capsule Take 150 mg by mouth as directed For Dentist appt  metFORMIN (GLUCOPHAGE) 500 MG tablet Take 500 mg by mouth daily with breakfast  metoprolol succinate (TOPROL-XL) 25 MG XL tablet Take 25 mg by mouth once daily  omeprazole (PRILOSEC) 40 MG DR capsule Take 40 mg by mouth once daily  polyethylene glycol (MIRALAX) powder One bottle for colonoscopy prep. Use as directed. 255 g 0  ticagrelor (BRILINTA) 90 mg tablet Take 90 mg by mouth 2 (  two) times daily  venlafaxine (EFFEXOR-XR) 75 MG XR capsule TAKE 1 CAPSULE BY MOUTH EVERY DAY  vit A/C/E ac/ZnOx/cupric oxide (EYE VITAMIN AND MINERALS ORAL) Take by mouth once daily  nitroGLYcerin (NITROSTAT) 0.4 MG SL tablet Place 0.4 mg under the tongue every 5 (five) minutes as needed (Patient not taking: Reported on 05/11/2021)   No current facility-administered medications for this visit.   Family History  Problem Relation Age of Onset  Coronary Artery Disease (Blocked arteries around heart)  Mother  Coronary Artery Disease (Blocked arteries around heart) Father  Breast cancer Neg Hx  Colon cancer Neg Hx   Labs and Radiology:   June 09, 2021 colonoscopy pathology:  DIAGNOSIS:  A. COLON POLYPS X2, DESCENDING; COLD BIOPSY AND COLD SNARE:  - FRAGMENTS (X2) OF SESSILE SERRATED POLYPS.  - FRAGMENTS (X2) OF BENIGN COLONIC MUCOSA WITH NO SIGNIFICANT  HISTOPATHOLOGIC CHANGE.  - NEGATIVE FOR DYSPLASIA AND MALIGNANCY.   B. COLON POLYP, CECUM; HOT SNARE:  - SESSILE SERRATED POLYP.  - SERRATED EPITHELIAL CHANGES EXTEND TO CAUTERIZED EDGES OF POLYP.  - NEGATIVE FOR DYSPLASIA AND MALIGNANCY.   C. COLON POLYPS X2, TRANSVERSE; COLD BIOPSY:  - FRAGMENTS (X2) OF SESSILE SERRATED POLYPS.  - NEGATIVE FOR DYSPLASIA AND MALIGNANCY.   D. COLON POLYP, TRANSVERSE; HOT SNARE:  - SESSILE SERRATED POLYP.  - SERRATED EPITHELIAL CHANGES EXTEND TO CAUTERIZED EDGES OF POLYP.  - NEGATIVE FOR DYSPLASIA AND MALIGNANCY.   E. COLON POLYPS X2, DESCENDING; COLD BIOPSY:  - SINGLE FRAGMENT SUGGESTIVE OF EARLY SESSILE SERRATED POLYP.  - MULTIPLE FRAGMENTS OF BENIGN COLONIC MUCOSA WITH SUPERFICIAL  HYPERPLASTIC/REACTIVE CHANGES.  - NEGATIVE FOR DYSPLASIA AND MALIGNANCY.   F. COLON POLYPS X3, SIGMOID AT 20 CM; COLD BIOPSY:  - MULTIPLE FRAGMENTS OF SESSILE SERRATED POLYPS.  - NEGATIVE FOR DYSPLASIA AND MALIGNANCY.   G. RECTAL POLYPS X2, LOWER AT 10 CM; HOT SNARE:  - FRAGMENTS (X2) OF SESSILE SERRATED POLYPS.  - SERRATED EPITHELIAL CHANGES EXTEND TO CAUTERIZED EDGES OF POLYP.  - NEGATIVE FOR DYSPLASIA AND MALIGNANCY.    Objective:  Physical Exam Constitutional:  Appearance: Normal appearance.  Cardiovascular:  Rate and Rhythm: Normal rate and regular rhythm.  Pulmonary:  Effort: Pulmonary effort is normal.  Breath sounds: Normal breath sounds.  Musculoskeletal:  Cervical back: Normal range of motion.  Skin: General: Skin is warm.  Neurological:  General: No focal deficit present.   Mental Status: She is alert and oriented to person, place, and time.  Psychiatric:  Mood and Affect: Mood normal.  Behavior: Behavior normal.  Thought Content: Thought content normal.    Assessment:   Candidate for early repeat colonoscopy based on the number of polyps at the time of her 2022 exam.  Candidate for genetic counseling.  Plan:   The patient has been referred to the genetic counselor, and while I could not find a note from that visit, she reports going over to the cancer center and then deciding not to proceed with testing. She has been encouraged to reassess this opportunity.  Based on the 2020 [place] preventative task force recommendations, patients with more than 10 polyps warrant a 1 year follow-up. The patient has been instructed in regards to the procedure by the staff.   This note is partially prepared by Karie Fetch, RN, acting as a scribe in the presence of Dr. Hervey Ard, MD.  This note is partially prepared by Ledell Noss, CMA acting as a scribe in the presence of Dr. Hervey Ard, MD.   The  documentation recorded by the scribe accurately reflects the service I personally performed and the decisions made by me.   Robert Bellow, MD FACS  Electronically signed by Mayer Masker, MD at 06/23/2022 7:32 AM EDT

## 2022-06-28 ENCOUNTER — Other Ambulatory Visit: Payer: Self-pay | Admitting: Internal Medicine

## 2022-07-19 ENCOUNTER — Encounter: Payer: Self-pay | Admitting: General Surgery

## 2022-07-20 ENCOUNTER — Ambulatory Visit: Payer: Medicare Other | Admitting: Certified Registered"

## 2022-07-20 ENCOUNTER — Encounter: Payer: Self-pay | Admitting: General Surgery

## 2022-07-20 ENCOUNTER — Encounter
Admission: RE | Disposition: A | Discharge status: Home / Self Care | Payer: Self-pay | Source: Ambulatory Visit | Attending: General Surgery

## 2022-07-20 ENCOUNTER — Ambulatory Visit
Admission: RE | Admit: 2022-07-20 | Discharge: 2022-07-20 | Disposition: A | Discharge status: Home / Self Care | Payer: Medicare Other | Source: Ambulatory Visit | Attending: General Surgery | Admitting: General Surgery

## 2022-07-20 DIAGNOSIS — Z1211 Encounter for screening for malignant neoplasm of colon: Secondary | ICD-10-CM | POA: Insufficient documentation

## 2022-07-20 DIAGNOSIS — I252 Old myocardial infarction: Secondary | ICD-10-CM | POA: Insufficient documentation

## 2022-07-20 DIAGNOSIS — M1990 Unspecified osteoarthritis, unspecified site: Secondary | ICD-10-CM | POA: Insufficient documentation

## 2022-07-20 DIAGNOSIS — I2510 Atherosclerotic heart disease of native coronary artery without angina pectoris: Secondary | ICD-10-CM | POA: Insufficient documentation

## 2022-07-20 DIAGNOSIS — Z955 Presence of coronary angioplasty implant and graft: Secondary | ICD-10-CM | POA: Diagnosis not present

## 2022-07-20 DIAGNOSIS — F418 Other specified anxiety disorders: Secondary | ICD-10-CM | POA: Diagnosis not present

## 2022-07-20 DIAGNOSIS — E119 Type 2 diabetes mellitus without complications: Secondary | ICD-10-CM | POA: Insufficient documentation

## 2022-07-20 DIAGNOSIS — Z87891 Personal history of nicotine dependence: Secondary | ICD-10-CM | POA: Diagnosis not present

## 2022-07-20 DIAGNOSIS — K5790 Diverticulosis of intestine, part unspecified, without perforation or abscess without bleeding: Secondary | ICD-10-CM | POA: Diagnosis not present

## 2022-07-20 DIAGNOSIS — K219 Gastro-esophageal reflux disease without esophagitis: Secondary | ICD-10-CM | POA: Insufficient documentation

## 2022-07-20 DIAGNOSIS — K5730 Diverticulosis of large intestine without perforation or abscess without bleeding: Secondary | ICD-10-CM | POA: Diagnosis not present

## 2022-07-20 DIAGNOSIS — Z86010 Personal history of colonic polyps: Secondary | ICD-10-CM | POA: Diagnosis not present

## 2022-07-20 DIAGNOSIS — D122 Benign neoplasm of ascending colon: Secondary | ICD-10-CM | POA: Diagnosis not present

## 2022-07-20 DIAGNOSIS — K635 Polyp of colon: Secondary | ICD-10-CM | POA: Diagnosis not present

## 2022-07-20 DIAGNOSIS — K621 Rectal polyp: Secondary | ICD-10-CM | POA: Diagnosis not present

## 2022-07-20 HISTORY — PX: COLONOSCOPY WITH PROPOFOL: SHX5780

## 2022-07-20 SURGERY — COLONOSCOPY WITH PROPOFOL
Anesthesia: General

## 2022-07-20 MED ORDER — SODIUM CHLORIDE 0.9 % IV SOLN
INTRAVENOUS | Status: AC | PRN
  Administered 2022-07-20: 3 mL via INTRAMUSCULAR

## 2022-07-20 MED ORDER — ESMOLOL HCL 100 MG/10ML IV SOLN
INTRAVENOUS | Status: DC | PRN
  Administered 2022-07-20: 10 mg via INTRAVENOUS

## 2022-07-20 MED ORDER — LIDOCAINE HCL (CARDIAC) PF 100 MG/5ML IV SOSY
PREFILLED_SYRINGE | INTRAVENOUS | Status: DC | PRN
  Administered 2022-07-20: 100 mg via INTRAVENOUS

## 2022-07-20 MED ORDER — PROPOFOL 500 MG/50ML IV EMUL
INTRAVENOUS | Status: DC | PRN
  Administered 2022-07-20: 165 ug/kg/min via INTRAVENOUS

## 2022-07-20 MED ORDER — SODIUM CHLORIDE 0.9 % IV SOLN
INTRAVENOUS | Status: DC
  Administered 2022-07-20: 1000 mL via INTRAVENOUS

## 2022-07-20 MED ORDER — OXYMETAZOLINE HCL 0.05 % NA SOLN
NASAL | Status: AC
  Filled 2022-07-20: qty 30

## 2022-07-20 MED ORDER — METOPROLOL TARTRATE 5 MG/5ML IV SOLN
INTRAVENOUS | Status: DC | PRN
  Administered 2022-07-20: 2 mg via INTRAVENOUS

## 2022-07-20 MED ORDER — PROPOFOL 10 MG/ML IV BOLUS
INTRAVENOUS | Status: DC | PRN
  Administered 2022-07-20: 20 mg via INTRAVENOUS
  Administered 2022-07-20: 30 mg via INTRAVENOUS
  Administered 2022-07-20: 70 mg via INTRAVENOUS
  Administered 2022-07-20: 20 mg via INTRAVENOUS
  Administered 2022-07-20: 5 mg via INTRAVENOUS

## 2022-07-20 MED ORDER — PROPOFOL 1000 MG/100ML IV EMUL
INTRAVENOUS | Status: AC
  Filled 2022-07-20: qty 100

## 2022-07-20 MED ORDER — OXYMETAZOLINE HCL 0.05 % NA SOLN
NASAL | Status: DC | PRN
  Administered 2022-07-20: 2 via NASAL

## 2022-07-20 MED ORDER — PHENYLEPHRINE 80 MCG/ML (10ML) SYRINGE FOR IV PUSH (FOR BLOOD PRESSURE SUPPORT)
PREFILLED_SYRINGE | INTRAVENOUS | Status: DC | PRN
  Administered 2022-07-20: 80 ug via INTRAVENOUS
  Administered 2022-07-20: 160 ug via INTRAVENOUS

## 2022-07-20 MED ORDER — DEXMEDETOMIDINE HCL IN NACL 200 MCG/50ML IV SOLN
INTRAVENOUS | Status: DC | PRN
  Administered 2022-07-20: 4 ug via INTRAVENOUS
  Administered 2022-07-20: 8 ug via INTRAVENOUS

## 2022-07-20 MED ORDER — GLYCOPYRROLATE 0.2 MG/ML IJ SOLN
INTRAMUSCULAR | Status: DC | PRN
  Administered 2022-07-20: .2 mg via INTRAVENOUS

## 2022-07-20 MED ORDER — PROPOFOL 1000 MG/100ML IV EMUL
INTRAVENOUS | Status: AC
  Filled 2022-07-20: qty 200

## 2022-07-20 NOTE — Transfer of Care (Signed)
Immediate Anesthesia Transfer of Care Note  Patient: Kayla Farmer  Procedure(s) Performed: COLONOSCOPY WITH PROPOFOL  Patient Location: Endoscopy Unit  Anesthesia Type:General  Level of Consciousness: drowsy and patient cooperative  Airway & Oxygen Therapy: Patient Spontanous Breathing and Patient connected to face mask oxygen  Post-op Assessment: Report given to RN and Post -op Vital signs reviewed and stable  Post vital signs: Reviewed and stable  Last Vitals:  Vitals Value Taken Time  BP 108/61 07/20/22 0835  Temp    Pulse 101 07/20/22 0839  Resp 15 07/20/22 0839  SpO2 97 % 07/20/22 0839  Vitals shown include unvalidated device data.  Last Pain:  Vitals:   07/20/22 0835  TempSrc:   PainSc: 0-No pain         Complications: No notable events documented.

## 2022-07-20 NOTE — Anesthesia Preprocedure Evaluation (Signed)
Anesthesia Evaluation  Patient identified by MRN, date of birth, ID band Patient awake    Reviewed: Allergy & Precautions, NPO status , Patient's Chart, lab work & pertinent test results  History of Anesthesia Complications Negative for: history of anesthetic complications  Airway Mallampati: II  TM Distance: >3 FB Neck ROM: Full    Dental  (+) Implants   Pulmonary neg shortness of breath, neg sleep apnea, neg COPD, neg recent URI, former smoker   breath sounds clear to auscultation- rhonchi (-) wheezing      Cardiovascular Exercise Tolerance: Good (-) hypertension(-) angina + CAD, + Past MI and + Cardiac Stents  (-) dysrhythmias (-) Valvular Problems/Murmurs Rhythm:Regular Rate:Normal - Systolic murmurs and - Diastolic murmurs    Neuro/Psych neg Seizures PSYCHIATRIC DISORDERS Anxiety Depression    negative neurological ROS     GI/Hepatic Neg liver ROS,GERD  ,,  Endo/Other  diabetes, Oral Hypoglycemic Agents    Renal/GU negative Renal ROS     Musculoskeletal  (+) Arthritis ,    Abdominal  (+) + obese  Peds  Hematology  (+) Blood dyscrasia, anemia   Anesthesia Other Findings Past Medical History: No date: Anxiety No date: Arthritis No date: CAD (coronary artery disease)     Comment:  a. 12/2018 NSTEMI/PCI: LM nl, LAD 30p, 31m(2.25x22               Resolute Onyx DES), LCX nl, OM2 nl, RCA 489mRPDA 100 w/               L->R collats (likely culprit - med rx). EF 50-55%. [facility]    Comment:  a. 07/2019 - L>R. No date: Depression No date: GERD (gastroesophageal reflux disease) No date: History of cervical cancer No date: History of tobacco abuse     Comment:  a. 30 pack year - quit 12/2018 @ time of NSTEMI. No date: HLD (hyperlipidemia) No date: Myocardial infarction ([facility]No date: Obesity No date: Other inflammations of eyelids 2019: Positive colorectal cancer screening using Cologuard  test No date: Pre-diabetes No date: Sinus infection   Reproductive/Obstetrics                             Anesthesia Physical Anesthesia Plan  ASA: 3  Anesthesia Plan: General   Post-op Pain Management:    Induction: Intravenous  PONV Risk Score and Plan: 2 and Propofol infusion and TIVA  Airway Management Planned: Natural Airway  Additional Equipment:   Intra-op Plan:   Post-operative Plan:   Informed Consent: I have reviewed the patients History and Physical, chart, labs and discussed the procedure including the risks, benefits and alternatives for the proposed anesthesia with the patient or authorized representative who has indicated his/her understanding and acceptance.     Dental advisory given  Plan Discussed with: CRNA and Anesthesiologist  Anesthesia Plan Comments:         Anesthesia Quick Evaluation

## 2022-07-20 NOTE — Op Note (Signed)
[facility] Gastroenterology Patient Name: Amillia Biffle Procedure Date: 07/20/2022 7:23 AM MRN: [id number] Account #: [id number] Date of Birth: 06-Aug-1966 Admit Type: Outpatient Age: 56 Room: [facility] ENDO ROOM 1 Gender: Female Note Status: Finalized Instrument Name: Peds Colonoscope [id number] Procedure:             Colonoscopy Indications:           High risk colon cancer surveillance: Personal history                         of colonic polyps Providers:             Robert Bellow, MD Referring MD:          Deborra Medina, MD (Referring MD) Medicines:             Propofol per Anesthesia Complications:         No immediate complications. Procedure:             Pre-Anesthesia Assessment:                        - Prior to the procedure, a History and Physical was                         performed, and patient medications, allergies and                         sensitivities were reviewed. The patient's tolerance                         of previous anesthesia was reviewed.                        - The risks and benefits of the procedure and the                         sedation options and risks were discussed with the                         patient. All questions were answered and informed                         consent was obtained.                        After obtaining informed consent, the colonoscope was                         passed under direct vision. Throughout the procedure,                         the patient's blood pressure, pulse, and oxygen                         saturations were monitored continuously. The                         Colonoscope was introduced through the anus and                         advanced  to the the cecum, identified by appendiceal                         orifice and ileocecal valve. The colonoscopy was                         performed without difficulty. The patient tolerated                         the procedure well. The quality  of the bowel                         preparation was excellent. Findings:      A few medium-mouthed diverticula were found in the sigmoid colon.      A 7 mm polyp was found in the distal ascending colon. The polyp was       sessile. Biopsies were taken with a cold forceps for histology.      A 5 mm polyp was found in the transverse colon distal transverse colon.       The polyp was sessile. Biopsies were taken with a cold forceps for       histology.      A 5 mm polyp was found in the descending colon. The polyp was sessile.       Biopsies were taken with a cold forceps for histology.      Multiple sessile polyps were found in the mid rectum. The polyps were 5       to 9 mm in size. These polyps were removed with a hot snare. Resection       and retrieval were complete.      The retroflexed view of the distal rectum and anal verge was normal and       showed no anal or rectal abnormalities. Impression:            - Diverticulosis in the sigmoid colon.                        - One 7 mm polyp in the distal ascending colon.                         Biopsied.                        - One 5 mm polyp in the transverse colon in the distal                         transverse colon. Biopsied.                        - One 5 mm polyp in the descending colon. Biopsied.                        - Multiple 5 to 9 mm polyps in the mid rectum, removed                         with a hot snare. Resected and retrieved.                        - The distal rectum and anal  verge are normal on                         retroflexion view. Recommendation:        - Telephone endoscopist for pathology results in 1                         week. Procedure Code(s):     --- Professional ---                        [phone], Colonoscopy, flexible; with removal of                         tumor(s), polyp(s), or other lesion(s) by snare                         technique                        45380, 24, Colonoscopy, flexible;  with biopsy, single                         or multiple Diagnosis Code(s):     --- Professional ---                        Z86.010, Personal history of colonic polyps                        D12.2, Benign neoplasm of ascending colon                        D12.3, Benign neoplasm of transverse colon (hepatic                         flexure or splenic flexure)                        D12.4, Benign neoplasm of descending colon                        D12.8, Benign neoplasm of rectum                        K57.30, Diverticulosis of large intestine without                         perforation or abscess without bleeding CPT copyright 2022 American Medical Association. All rights reserved. The codes documented in this report are preliminary and upon coder review may  be revised to meet current compliance requirements. Robert Bellow, MD 07/20/2022 8:35:04 AM This report has been signed electronically. Number of Addenda: 0 Note Initiated On: 07/20/2022 7:23 AM Scope Withdrawal Time: 0 hours 40 minutes 53 seconds  Total Procedure Duration: 0 hours 46 minutes 19 seconds  Estimated Blood Loss:  Estimated blood loss was minimal.      [facility]

## 2022-07-20 NOTE — Anesthesia Procedure Notes (Addendum)
Procedure Name: General with mask airway Date/Time: 07/20/2022 7:40 AM  Performed by: Kelton Pillar, CRNAPre-anesthesia Checklist: Patient identified, Emergency Drugs available, Suction available and Patient being monitored Patient Re-evaluated:Patient Re-evaluated prior to induction Oxygen Delivery Method: Simple face mask Induction Type: IV induction Ventilation: Nasal airway inserted- appropriate to patient size Placement Confirmation: positive ETCO2, CO2 detector and breath sounds checked- equal and bilateral Dental Injury: Teeth and Oropharynx as per pre-operative assessment

## 2022-07-20 NOTE — H&P (Signed)
Kayla Farmer [id number] [birth year]     HPI: Patient with multiple polyps, TA/ SSA last year. For follow exam.    Medications Prior to Admission  Medication Sig Dispense Refill Last Dose   aspirin EC 81 MG EC tablet Take 1 tablet (81 mg total) by mouth daily. 30 tablet 0 Past Week   atorvastatin (LIPITOR) 80 MG tablet TAKE 1 TABLET BY MOUTH DAILY AT 6 PM 90 tablet 3 Past Week   busPIRone (BUSPAR) 30 MG tablet TAKE 1 TABLET(30 MG) BY MOUTH THREE TIMES DAILY AS NEEDED FOR ANXIETY 90 tablet 1 Past Week   mometasone (ELOCON) 0.1 % cream Apply to ear canal twice daily as needed for itching 15 g 1 Past Week   Multiple Vitamins-Minerals (PRESERVISION AREDS 2+MULTI VIT) CAPS Take by mouth.   Past Week   omeprazole (PRILOSEC) 40 MG capsule TAKE 1 CAPSULE(40 MG) BY MOUTH DAILY 90 capsule 1 Past Week   venlafaxine XR (EFFEXOR-XR) 75 MG 24 hr capsule TAKE 1 CAPSULE BY MOUTH EVERY DAY 90 capsule 1 Past Week   clindamycin (CLEOCIN) 150 MG capsule Take 150 mg by mouth 4 (four) times daily. (Patient not taking: Reported on 05/02/2022)      metoprolol succinate (TOPROL-XL) 25 MG 24 hr tablet TAKE 1 TABLET(25 MG) BY MOUTH DAILY (Patient not taking: Reported on 05/02/2022) 90 tablet 3    Allergies  Allergen Reactions   Codeine Itching   Past Medical History:  Diagnosis Date   Anxiety    Arthritis    CAD (coronary artery disease)    a. 12/2018 NSTEMI/PCI: LM nl, LAD 30p, 69m(2.25x22 Resolute Onyx DES), LCX nl, OM2 nl, RCA 477mRPDA 100 w/ L->R collats (likely culprit - med rx). EF 50-55%.   [facility]   a. 07/2019 - L>R.   Depression    GERD (gastroesophageal reflux disease)    History of cervical cancer    History of tobacco abuse    a. 30 pack year - quit 12/2018 @ time of NSTEMI.   HLD (hyperlipidemia)    Myocardial infarction (HCS[facility]   Obesity    Other inflammations of eyelids    Positive colorectal cancer screening using Cologuard test 2019   Pre-diabetes    Sinus infection    Past  Surgical History:  Procedure Laterality Date   CARDIAC CATHETERIZATION     COLONOSCOPY WITH PROPOFOL N/A 01/24/2018   Procedure: COLONOSCOPY WITH PROPOFOL;  Surgeon: ByRobert BellowMD;  Location: [facility] ENDOSCOPY;  Service: Endoscopy;  Laterality: N/A;   COLONOSCOPY WITH PROPOFOL N/A 06/09/2021   Procedure: COLONOSCOPY WITH PROPOFOL;  Surgeon: ByRobert BellowMD;  Location: [facility] ENDOSCOPY;  Service: Endoscopy;  Laterality: N/A;   CORONARY STENT INTERVENTION N/A 01/07/2019   Procedure: CORONARY STENT INTERVENTION;  Surgeon: ArWellington HampshireMD;  [place] [facility];  Service: Cardiovascular;  Laterality: N/A;  LAD   csection x2     DILATION AND CURETTAGE OF UTERUS     ESOPHAGOGASTRODUODENOSCOPY (EGD) WITH PROPOFOL N/A 06/09/2021   Procedure: ESOPHAGOGASTRODUODENOSCOPY (EGD) WITH PROPOFOL;  Surgeon: ByRobert BellowMD;  [facility];  Service: Endoscopy;  Laterality: N/A;   LEFT HEART CATH AND CORONARY ANGIOGRAPHY N/A 01/07/2019   Procedure: LEFT HEART CATH AND CORONARY ANGIOGRAPHY;  Surgeon: ArWellington HampshireMD;  [place] [facility];  Service: Cardiovascular;  Laterality: N/A;   LIVER BIOPSY  04/26/2017   TONSILLECTOMY     Social History   Socioeconomic History  Marital status: Married    Spouse name: Not on file   Number of children: Not on file   Years of education: Not on file   Highest education level: Not on file  Occupational History   Not on file  Tobacco Use   Smoking status: Former    Packs/day: 1.00    Years: 30.00    Total pack years: 30.00    Types: Cigarettes    Quit date: 01/06/2019    Years since quitting: 3.5   Smokeless tobacco: Never   Tobacco comments:    smokes one pack of cigarettes per day since age 8, quit during pregnancies.  Vaping Use   Vaping Use: Never used  Substance and Sexual Activity   Alcohol use: No    Alcohol/week: 0.0 standard drinks of alcohol   Drug use: No   Sexual activity: Not on  file  Other Topics Concern   Not on file  Social History Narrative   Lives with spouse and children.  Daughter in Sports coach of Loletha Carrow and Josph Macho.   Quit high school in the 9th grade.  Was raised by grandmother due to parental conflicts.   Social Determinants of Health   Financial Resource Strain: Low Risk  (04/27/2020)   Overall Financial Resource Strain (CARDIA)    Difficulty of Paying Living Expenses: Not hard at all  Food Insecurity: No Food Insecurity (04/27/2020)   Hunger Vital Sign    Worried About Running Out of Food in the Last Year: Never true    Ran Out of Food in the Last Year: Never true  Transportation Needs: No Transportation Needs (04/27/2020)   PRAPARE - Hydrologist (Medical): No    Lack of Transportation (Non-Medical): No  Physical Activity: Sufficiently Active (01/31/2019)   Exercise Vital Sign    Days of Exercise per Week: 5 days    Minutes of Exercise per Session: 30 min  Stress: No Stress Concern Present (04/27/2020)   [place]    Feeling of Stress : Not at all  Social Connections: Unknown (04/27/2020)   Social Connection and Isolation Panel [NHANES]    Frequency of Communication with Friends and Family: Not on file    Frequency of Social Gatherings with Friends and Family: More than three times a week    Attends Religious Services: Not on file    Active Member of Clubs or Organizations: Not on file    Attends Archivist Meetings: Not on file    Marital Status: Married  Intimate Partner Violence: Not At Risk (04/27/2020)   Humiliation, Afraid, Rape, and Kick questionnaire    Fear of Current or Ex-Partner: No    Emotionally Abused: No    Physically Abused: No    Sexually Abused: No   Social History   Social History Narrative   Lives with spouse and children.  Daughter in Sports coach of Loletha Carrow and Josph Macho.   Quit high school in the 9th grade.  Was raised by  grandmother due to parental conflicts.     ROS: Negative.     PE: HEENT: Negative. Lungs: Clear. Cardio: RRForest Gleason Lorann Farmer 07/20/2022   Assessment/Plan:  Proceed with planned endoscopy.

## 2022-07-21 ENCOUNTER — Encounter: Payer: Self-pay | Admitting: General Surgery

## 2022-07-21 LAB — SURGICAL PATHOLOGY

## 2022-07-22 LAB — HM COLONOSCOPY

## 2022-07-25 DIAGNOSIS — E119 Type 2 diabetes mellitus without complications: Secondary | ICD-10-CM | POA: Diagnosis not present

## 2022-07-25 LAB — HM DIABETES EYE EXAM

## 2022-08-01 NOTE — Anesthesia Postprocedure Evaluation (Signed)
Anesthesia Post Note  Patient: Kayla Farmer  Procedure(s) Performed: COLONOSCOPY WITH PROPOFOL  Patient location during evaluation: Endoscopy Anesthesia Type: General Level of consciousness: awake and alert Pain management: pain level controlled Vital Signs Assessment: post-procedure vital signs reviewed and stable Respiratory status: spontaneous breathing, nonlabored ventilation, respiratory function stable and patient connected to nasal cannula oxygen Cardiovascular status: blood pressure returned to baseline and stable Postop Assessment: no apparent nausea or vomiting Anesthetic complications: no   No notable events documented.   Last Vitals:  Vitals:   07/20/22 0915 07/20/22 0924  BP: 103/72   Pulse:    Resp:    Temp:    SpO2:  97%    Last Pain:  Vitals:   07/21/22 0753  TempSrc:   PainSc: 3                  Martha Clan

## 2022-09-07 ENCOUNTER — Other Ambulatory Visit: Payer: Self-pay

## 2022-09-07 MED ORDER — BUSPIRONE HCL 30 MG PO TABS: 30.0000 mg | ORAL_TABLET | Freq: Three times a day (TID) | ORAL | 1 refills | Status: DC

## 2022-10-29 ENCOUNTER — Other Ambulatory Visit: Payer: Self-pay | Admitting: Cardiovascular Disease

## 2022-10-29 DIAGNOSIS — I25118 Atherosclerotic heart disease of native coronary artery with other forms of angina pectoris: Secondary | ICD-10-CM

## 2022-10-29 DIAGNOSIS — E785 Hyperlipidemia, unspecified: Secondary | ICD-10-CM

## 2022-10-31 NOTE — Telephone Encounter (Signed)
Please contact pt for future appointment. Pt due for 12 month f/u.

## 2022-11-14 ENCOUNTER — Ambulatory Visit: Payer: Medicare Other | Admitting: Internal Medicine

## 2022-11-28 ENCOUNTER — Ambulatory Visit (INDEPENDENT_AMBULATORY_CARE_PROVIDER_SITE_OTHER): Payer: Medicare Other | Admitting: Internal Medicine

## 2022-11-28 ENCOUNTER — Encounter: Payer: Self-pay | Admitting: Internal Medicine

## 2022-11-28 VITALS — BP 128/78 | HR 97 | Temp 98.0°F | Ht 63.0 in | Wt 213.4 lb

## 2022-11-28 DIAGNOSIS — I25118 Atherosclerotic heart disease of native coronary artery with other forms of angina pectoris: Secondary | ICD-10-CM | POA: Diagnosis not present

## 2022-11-28 DIAGNOSIS — K760 Fatty (change of) liver, not elsewhere classified: Secondary | ICD-10-CM | POA: Diagnosis not present

## 2022-11-28 DIAGNOSIS — Z0001 Encounter for general adult medical examination with abnormal findings: Secondary | ICD-10-CM | POA: Diagnosis not present

## 2022-11-28 DIAGNOSIS — Z8659 Personal history of other mental and behavioral disorders: Secondary | ICD-10-CM

## 2022-11-28 DIAGNOSIS — M25552 Pain in left hip: Secondary | ICD-10-CM

## 2022-11-28 DIAGNOSIS — G8929 Other chronic pain: Secondary | ICD-10-CM | POA: Diagnosis not present

## 2022-11-28 DIAGNOSIS — E669 Obesity, unspecified: Secondary | ICD-10-CM

## 2022-11-28 DIAGNOSIS — E559 Vitamin D deficiency, unspecified: Secondary | ICD-10-CM

## 2022-11-28 DIAGNOSIS — E1169 Type 2 diabetes mellitus with other specified complication: Secondary | ICD-10-CM

## 2022-11-28 DIAGNOSIS — F411 Generalized anxiety disorder: Secondary | ICD-10-CM

## 2022-11-28 DIAGNOSIS — Z87891 Personal history of nicotine dependence: Secondary | ICD-10-CM

## 2022-11-28 LAB — COMPREHENSIVE METABOLIC PANEL
ALT: 24 U/L (ref 0–35)
AST: 22 U/L (ref 0–37)
Albumin: 3.8 g/dL (ref 3.5–5.2)
Alkaline Phosphatase: 187 U/L — ABNORMAL HIGH (ref 39–117)
BUN: 19 mg/dL (ref 6–23)
CO2: 27 mEq/L (ref 19–32)
Calcium: 9.2 mg/dL (ref 8.4–10.5)
Chloride: 103 mEq/L (ref 96–112)
Creatinine, Ser: 0.88 mg/dL (ref 0.40–1.20)
GFR: 72.19 mL/min (ref >60.00)
Glucose, Bld: 102 mg/dL — ABNORMAL HIGH (ref 70–99)
Potassium: 4.2 mEq/L (ref 3.5–5.1)
Sodium: 140 mEq/L (ref 135–145)
Total Bilirubin: 0.4 mg/dL (ref 0.2–1.2)
Total Protein: 6.8 g/dL (ref 6.0–8.3)

## 2022-11-28 LAB — MICROALBUMIN / CREATININE URINE RATIO
Creatinine,U: 128.2 mg/dL
Microalb Creat Ratio: 0.8 mg/g (ref 0.0–30.0)
Microalb, Ur: 1 mg/dL (ref 0.0–1.9)

## 2022-11-28 LAB — HEMOGLOBIN A1C: Hgb A1c MFr Bld: 6.4 % (ref 4.6–6.5)

## 2022-11-28 LAB — LIPID PANEL
Cholesterol: 128 mg/dL (ref 0–200)
HDL: 42.7 mg/dL (ref >39.00)
LDL Cholesterol: 60 mg/dL (ref 0–99)
NonHDL: 85.31
Total CHOL/HDL Ratio: 3
Triglycerides: 128 mg/dL (ref 0.0–149.0)
VLDL: 25.6 mg/dL (ref 0.0–40.0)

## 2022-11-28 LAB — LDL CHOLESTEROL, DIRECT: Direct LDL: 65 mg/dL

## 2022-11-28 LAB — VITAMIN D 25 HYDROXY (VIT D DEFICIENCY, FRACTURES): VITD: 25.84 ng/mL — ABNORMAL LOW (ref 30.00–100.00)

## 2022-11-28 NOTE — Assessment & Plan Note (Signed)
She has not smoked since April 27. 2020  (since her AMI).   Encouraged to continue abstinence and warned of the risk of stent closure if she resumes tobacco abuse. She declines lung cancer screening offered today

## 2022-11-28 NOTE — Assessment & Plan Note (Signed)
She has mild degenerative changes of both hips and of the lumbar spine by plain films doe in 2023

## 2022-11-28 NOTE — Assessment & Plan Note (Signed)
Chronic,  Acquired in adolescence.  She declines change in  treatment and referral to psychiatry . Continue buspirone and effexor.

## 2022-11-28 NOTE — Assessment & Plan Note (Addendum)
She is asymptomatic and remains tobacco free.  Last OV with Arida reviewed Feb 2023 . She declines trial of Jardiance despite discussion of the benefits,  She is taking metoprolol, atin and ASA

## 2022-11-28 NOTE — Assessment & Plan Note (Addendum)
Managed with diet per patient preference. In spite of fatty liver and CAD, she refuses metformin and Jardiance .despite review of the metabolic and renal benefits.  She has declined nutritional diabetes education . Continue statin, ASA  and ARB  for secondary prevention of CAD.  She declines all vaccinations against streptococcal  pneumonia despite my recommendations  Lab Results  Component Value Date   HGBA1C 6.4 11/28/2022   Lab Results  Component Value Date   MICROALBUR 1.0 11/28/2022   MICROALBUR 1.3 05/02/2022

## 2022-11-28 NOTE — Patient Instructions (Addendum)
For your allergies ,  You can use Benadryl but you should also consider adding one of these newer second generation antihistamines that are longer acting, non sedating and  available OTC:  Generic  Zyrtec, which is cetirizine.    generic Allegra , available generically as fexofenadine ; comes in 60 mg and 180 mg once daily strengths.    Generic Claritin :  also available as loratidine .    You can add or substitute Flonase  for management of allergies    Please reconsider getting vaccinated against pneumonia

## 2022-11-28 NOTE — Assessment & Plan Note (Addendum)
Untreated,  With patient quitting school in the 9th grade.  Her current symptoms of anxiety and uncontrolled crying appear to have started as a child  leading to low self esteem.   She was referred to Dr Nicolasa Ducking  in Feb 2023  but declined to be seen. She continues to form opinions and based decisions on a limited knowledge base

## 2022-11-28 NOTE — Progress Notes (Signed)
Subjective:  Patient ID: Kayla Farmer, female    DOB: 01-31-1966  Age: 57 y.o. MRN: [id number]  CC: The primary encounter diagnosis was Vitamin D deficiency. Diagnoses of Type 2 diabetes mellitus with obesity ([place]), Coronary artery disease of native artery of native heart with stable angina pectoris ([place]), History of tobacco abuse, History of learning disability as a child, Generalized anxiety disorder, Encounter for general adult medical examination with abnormal findings, and Chronic left hip pain were also pertinent to this visit.   HPI KHAMRYN OVERFELT presents for  Chief Complaint  Patient presents with   Medical Management of Chronic Issues   1) T2DM w obesity , hypertension and hyperlipidemia . Denies large appetite,  only eats once or twice daily.  Sometimes just once daily.  Not exercising,  unable to get  below 200 lbs.   Drinks chocolate milk (made with whole milk ) during the day .  Taking her medications.  Does not check  BS . Denies neuropathy   2)GAD:  aggravated by full time care of mother who has undiagnosed dementia,  COPD on oxygen 24/7, heart failure,  and CKD . Has no other family members providing support.   Feels judged by her daughter in law who wants her to return to church .  takes effexor xr  75 mg daily. Doesn't want to change meds. "I've always been this way,  since childhood."  Unhappy with the liberal attitudes  that her family's church has adopted so she doesn't attend  anymore   3) sinus congestion,  sneezing and PND started several days ago, improving . Not taking anything "I'm not sure what I can take with my heart"   4) CAD:  s/p stent .  has not had chest pain , but doesn't exercise regularly anymore bc of mother's health. And lack of assistance from husband and children in caring for her.  However she used to walk > 3 miles daily and has recently started back but not regularly.   5) left hip/thigh pain :  worse after sitting,  sometimes "catches" .  Has OA of  fingers.  Causing her to drop things  occasionally .  Declines meds even tylenol   Outpatient Medications Prior to Visit  Medication Sig Dispense Refill   aspirin EC 81 MG EC tablet Take 1 tablet (81 mg total) by mouth daily. 30 tablet 0   atorvastatin (LIPITOR) 80 MG tablet TAKE 1 TABLET BY MOUTH DAILY AT 6 PM 90 tablet 3   busPIRone (BUSPAR) 30 MG tablet Take 1 tablet (30 mg total) by mouth 3 (three) times daily. 90 tablet 1   clindamycin (CLEOCIN) 150 MG capsule Take 150 mg by mouth 4 (four) times daily.     metoprolol succinate (TOPROL-XL) 25 MG 24 hr tablet TAKE 1 TABLET(25 MG) BY MOUTH DAILY 90 tablet 3   mometasone (ELOCON) 0.1 % cream Apply to ear canal twice daily as needed for itching 15 g 1   Multiple Vitamins-Minerals (PRESERVISION AREDS 2+MULTI VIT) CAPS Take by mouth.     omeprazole (PRILOSEC) 40 MG capsule TAKE 1 CAPSULE(40 MG) BY MOUTH DAILY 90 capsule 1   venlafaxine XR (EFFEXOR-XR) 75 MG 24 hr capsule TAKE 1 CAPSULE BY MOUTH EVERY DAY 90 capsule 1   No facility-administered medications prior to visit.    Review of Systems;  Patient denies headache, fevers, malaise, unintentional weight loss, skin rash, eye pain, sinus congestion and sinus pain, sore throat, dysphagia,  hemoptysis ,  cough, dyspnea, wheezing, chest pain, palpitations, orthopnea, edema, abdominal pain, nausea, melena, diarrhea, constipation, flank pain, dysuria, hematuria, urinary  Frequency, nocturia, numbness, tingling, seizures,  Focal weakness, Loss of consciousness,  Tremor, insomnia, depression, anxiety, and suicidal ideation.      Objective:  BP 128/78   Pulse 97   Temp 98 F (36.7 C) (Oral)   Ht 5\' 3"  (1.6 m)   Wt 213 lb 6.4 oz (96.8 kg)   LMP 10/13/2014 (Approximate)   SpO2 98%   BMI 37.80 kg/m   BP Readings from Last 3 Encounters:  11/28/22 128/78  07/20/22 103/72  05/02/22 122/72    Wt Readings from Last 3 Encounters:  11/28/22 213 lb 6.4 oz (96.8 kg)  07/20/22 212 lb 7 oz (96.4  kg)  05/02/22 209 lb 6.4 oz (95 kg)    Physical Exam  Lab Results  Component Value Date   HGBA1C 6.4 11/28/2022   HGBA1C 6.5 05/02/2022   HGBA1C 6.2 10/25/2021    Lab Results  Component Value Date   CREATININE 0.88 11/28/2022   CREATININE 0.82 05/02/2022   CREATININE 0.94 10/25/2021    Lab Results  Component Value Date   WBC 9.3 03/30/2021   HGB 11.0 (L) 03/30/2021   HCT 34.4 (L) 03/30/2021   PLT 232.0 03/30/2021   GLUCOSE 102 (H) 11/28/2022   CHOL 128 11/28/2022   TRIG 128.0 11/28/2022   HDL 42.70 11/28/2022   LDLDIRECT 65.0 11/28/2022   LDLCALC 60 11/28/2022   ALT 24 11/28/2022   AST 22 11/28/2022   NA 140 11/28/2022   K 4.2 11/28/2022   CL 103 11/28/2022   CREATININE 0.88 11/28/2022   BUN 19 11/28/2022   CO2 27 11/28/2022   TSH 1.93 03/19/2020   INR 1.0 01/07/2019   HGBA1C 6.4 11/28/2022   MICROALBUR 1.0 11/28/2022    No results found.  Assessment & Plan:  .Vitamin D deficiency -     VITAMIN D 25 Hydroxy (Vit-D Deficiency, Fractures)  Type 2 diabetes mellitus with obesity ([place]) Assessment & Plan: Managed with diet per patient preference. In spite of fatty liver and CAD, she refuses metformin and Jardiance .despite review of the metabolic and renal benefits.  She has declined nutritional diabetes education . Continue statin, ASA  and ARB  for secondary prevention of CAD.  She declines all vaccinations against streptococcal  pneumonia despite my recommendations  Lab Results  Component Value Date   HGBA1C 6.4 11/28/2022   Lab Results  Component Value Date   MICROALBUR 1.0 11/28/2022   MICROALBUR 1.3 05/02/2022        Orders: -     Hemoglobin A1c -     Comprehensive metabolic panel -     Lipid panel -     LDL cholesterol, direct -     Microalbumin / creatinine urine ratio  Coronary artery disease of native artery of native heart with stable angina pectoris [facility]) Assessment & Plan: She is asymptomatic and remains tobacco free.  Last OV with  Arida reviewed Feb 2023 . She declines trial of Jardiance despite discussion of the benefits,  She is taking metoprolol, atin and ASA    History of tobacco abuse Assessment & Plan: She has not smoked since April 27. 2020  (since her AMI).   Encouraged to continue abstinence and warned of the risk of stent closure if she resumes tobacco abuse. She declines lung cancer screening offered today   History of learning disability as a child Assessment & Plan:  Untreated,  With patient quitting school in the 9th grade.  Her current symptoms of anxiety and uncontrolled crying appear to have started as a child  leading to low self esteem.   She was referred to Dr Nicolasa Ducking  in Feb 2023  but declined to be seen. She continues to form opinions and based decisions on a limited knowledge base    Generalized anxiety disorder Assessment & Plan: Chronic,  Acquired in adolescence.  She declines change in  treatment and referral to psychiatry . Continue buspirone and effexor.    Encounter for general adult medical examination with abnormal findings  Chronic left hip pain Assessment & Plan: She has mild degenerative changes of both hips and of the lumbar spine by plain films doe in 2023      I provided 41 minutes of face-to-face time during this encounter reviewing patient's last visit with me, patient's  most recent visit with cardiology,  previous  labs and imaging studies, counseling on diabets management . Obesity and exercise.,  and post visit ordering to diagnostics and therapeutics .   Follow-up: Return in about 6 months (around 05/31/2023) for follow up diabetes.   [name], MD

## 2022-11-30 NOTE — Addendum Note (Signed)
Addended by: [name] on: 11/30/2022 06:24 PM   Modules accepted: Orders

## 2022-12-01 ENCOUNTER — Telehealth: Payer: Self-pay | Admitting: Internal Medicine

## 2022-12-01 NOTE — Telephone Encounter (Signed)
Lft pt vm to call ofc . thanks 

## 2022-12-01 NOTE — Telephone Encounter (Signed)
Pt returned Rasheedah call. Transferred. 

## 2022-12-05 ENCOUNTER — Ambulatory Visit
Admission: RE | Admit: 2022-12-05 | Discharge: 2022-12-05 | Disposition: A | Discharge status: Home / Self Care | Payer: Medicare Other | Source: Ambulatory Visit | Attending: Internal Medicine | Admitting: Internal Medicine

## 2022-12-05 DIAGNOSIS — K7581 Nonalcoholic steatohepatitis (NASH): Secondary | ICD-10-CM | POA: Diagnosis not present

## 2022-12-05 DIAGNOSIS — K760 Fatty (change of) liver, not elsewhere classified: Secondary | ICD-10-CM | POA: Diagnosis not present

## 2022-12-06 ENCOUNTER — Other Ambulatory Visit: Payer: Self-pay

## 2022-12-06 MED ORDER — VENLAFAXINE HCL ER 75 MG PO CP24: 75.0000 mg | ORAL_CAPSULE | Freq: Every day | ORAL | 1 refills | Status: DC

## 2022-12-08 ENCOUNTER — Other Ambulatory Visit: Payer: Self-pay | Admitting: Internal Medicine

## 2022-12-08 DIAGNOSIS — K7689 Other specified diseases of liver: Secondary | ICD-10-CM

## 2022-12-08 DIAGNOSIS — K7581 Nonalcoholic steatohepatitis (NASH): Secondary | ICD-10-CM

## 2022-12-08 NOTE — Assessment & Plan Note (Signed)
Ultrasound repeated. She has a septated cyst in the left lobe.  AFP ordered and GI referral made

## 2022-12-13 ENCOUNTER — Other Ambulatory Visit (INDEPENDENT_AMBULATORY_CARE_PROVIDER_SITE_OTHER): Payer: Medicare Other

## 2022-12-13 DIAGNOSIS — K7689 Other specified diseases of liver: Secondary | ICD-10-CM | POA: Diagnosis not present

## 2022-12-14 LAB — AFP TUMOR MARKER: AFP, Serum, Tumor Marker: 4 ng/mL (ref 0.0–9.2)

## 2023-01-13 ENCOUNTER — Other Ambulatory Visit: Payer: Self-pay

## 2023-01-13 MED ORDER — OMEPRAZOLE 40 MG PO CPDR: DELAYED_RELEASE_CAPSULE | ORAL | 1 refills | Status: DC

## 2023-01-13 NOTE — Telephone Encounter (Signed)
Patient did not have her calendar and will call back to schedule

## 2023-01-17 MED ORDER — ATORVASTATIN CALCIUM 80 MG PO TABS: 80.0000 mg | ORAL_TABLET | Freq: Every day | ORAL | 0 refills | Status: DC

## 2023-01-17 MED ORDER — METOPROLOL SUCCINATE ER 25 MG PO TB24: ORAL_TABLET | ORAL | 0 refills | Status: DC

## 2023-01-17 NOTE — Telephone Encounter (Signed)
Requested Prescriptions   Signed Prescriptions Disp Refills   atorvastatin (LIPITOR) 80 MG tablet 90 tablet 0    Sig: Take 1 tablet (80 mg total) by mouth daily.    Authorizing Provider: Lorine Bears A    Ordering User: Iverson Alamin C   metoprolol succinate (TOPROL-XL) 25 MG 24 hr tablet 90 tablet 0    Sig: TAKE 1 TABLET(25 MG) BY MOUTH DAILY    Authorizing Provider: Lorine Bears A    Ordering User: Kendrick Fries

## 2023-01-17 NOTE — Addendum Note (Signed)
Addended by: Kendrick Fries on: 01/17/2023 07:27 AM   Modules accepted: Orders

## 2023-01-24 ENCOUNTER — Other Ambulatory Visit: Payer: Self-pay | Admitting: Internal Medicine

## 2023-01-24 ENCOUNTER — Ambulatory Visit: Payer: Medicare Other | Admitting: Gastroenterology

## 2023-01-24 ENCOUNTER — Encounter: Payer: Self-pay | Admitting: Gastroenterology

## 2023-01-24 VITALS — BP 111/74 | HR 86 | Temp 98.1°F | Ht 63.0 in | Wt 213.0 lb

## 2023-01-24 DIAGNOSIS — R748 Abnormal levels of other serum enzymes: Secondary | ICD-10-CM | POA: Diagnosis not present

## 2023-01-24 DIAGNOSIS — Z1231 Encounter for screening mammogram for malignant neoplasm of breast: Secondary | ICD-10-CM

## 2023-01-24 DIAGNOSIS — K7581 Nonalcoholic steatohepatitis (NASH): Secondary | ICD-10-CM

## 2023-01-24 NOTE — Patient Instructions (Signed)
The Mediterranean diet has received much attention as a healthy way to eat, and with good reason. The Mediterranean diet has been shown to reduce risk of heart disease, metabolic syndrome, diabetes, certain cancers, depression, and in older adults, a decreased risk of frailty, along with better mental and physical function.   What is the Mediterranean diet? The traditional Mediterranean diet is based on foods available in countries that border the [employer]. The foundation for this healthy diet includes  An abundance of plant foods, including fruits, vegetables, whole grains, nuts and legumes, which are minimally processed, seasonally fresh, and grown locally olive oil as the principal source of fat cheese and yogurt, consumed daily in low to moderate amounts fish and poultry, consumed in low to moderate amounts a few times a week red meat, consumed infrequently and in small amounts fresh fruit for dessert, with sweets containing added sugars or honey eaten only a few times each week wine consumed in low to moderate amounts, usually with meals. Protect yourself from the damage of chronic inflammation. Science has proven that chronic, low-grade inflammation can turn into a silent killer that contributes to cardiovascular disease, cancer, type 2 diabetes and other conditions. Get simple tips to fight inflammation and stay healthy -- from [facility] experts.   Switch from whatever fats you use now to extra virgin olive oil. Start by using olive oil in cooking, and then try some new salad dressings with olive oil as the base. Finally, use olive oil in place of butter on your crusty bread. Eat nuts and olives. Consume a handful of raw nuts every day as a healthy replacement for processed snacks. Add whole-grain bread or other whole grains to the meal. Select dense, chewy, country-style loaves without added sugar or butter. Experiment with bulgur, barley, farro, couscous, and  whole-grain pasta. Begin or end each meal with a salad. Choose crisp, dark greens and whatever vegetables are in season. Add more and different vegetables to the menu. Add an extra serving of vegetables to both lunch and dinner, aiming for three to four servings a day. Try a new vegetable every week Eat at least three servings a week of legumes. Options include lentils, chickpeas, beans, and peas. Eat less meat. Choose lean poultry in moderate, 3- to 4-ounce portions. Save red meat for occasional consumption or use meat as a condiment, accompanied by lots of vegetables, as in stews, stir-fries, and soups. Eat more fish, aiming for two to three servings a week. Both canned and fresh fish are fine. Substitute wine in moderation for other alcoholic beverages. Replace beer or liquors with wine -- no more than two 5-ounce glasses per day for men, and one glass per day for women. Cut out sugary beverages. Replace soda and juices with water. Eat less high-fat, high-sugar desserts. Poached or fresh fruit is best. Aim for three servings of fresh fruit a day. Save cakes and pastries for special occasions. Seek out the best quality food available. Farmer's markets are an excellent source of locally grown, seasonal foods. Finally, try to have dinner as a family as often as possible. Food as a communal, shared experience is a big part of the Mediterranean approach.  Mediterranean all day There are many ways to incorporate the delicious foods of the Mediterranean diet into your daily menu. Here are a few ideas to get you started.  Breakfast:  whole-grain bread topped with a small amount of low-fat cheese and slices of fresh tomato, drizzled with a little  extra virgin olive oil vegetable omelet made with mushrooms, spinach, and onions cooked in olive oil with crusty whole-grain bread plain [place] yogurt topped with nuts and fresh berries. Lunch:  [place] salad made with chopped mixed greens, kalamata olives,  tomatoes, fresh parsley, feta cheese. Dress with extra virgin olive oil and freshly squeezed lemon chickpea and farro salad with red peppers, spring onions, and fresh oregano, dressed with extra virgin olive oil and lemon juice vegetarian pizza topped with part-skim mozzarella cheese, roasted broccoli, onions, green peppers, and carrots. Dinner:  grilled vegetable kabobs with shrimp, toasted quinoa salad, and mixed green salad with pine nuts chicken stir-fried in olive oil with broccoli, cauliflower, asparagus, and yellow peppers, served over brown rice steamed mussels with spinach-orzo salad and minestrone soup.

## 2023-01-24 NOTE — Progress Notes (Signed)
Gastroenterology Consultation  Referring Provider:     Sherlene Shams, MD Primary Care Physician:  Kayla Shams, MD Primary Gastroenterologist:  Dr. Servando Snare     Reason for Consultation:     Fatty liver        HPI:   Kayla Farmer is a 57 y.o. y/o female referred for consultation & management of fatty liver by Dr. Darrick Farmer, Mar Daring, MD. This comes in today with a history of seeing me in 2018 for elevated alkaline phosphatase and an ultrasound that showed fatty liver.  The patient is now again here for the fatty liver.  The patient's alkaline phosphatase remains elevated.  At the time of the last visit the fractionation of the alkaline phosphatase was equally distributed between the bone and the liver and the patient was recommended to have a liver biopsy.  The patient's liver biopsy showed no cause for the increased alkaline phosphatase and showed that the fatty liver comprise less than 5% of the total liver parenchyma.  The patient denies any diabetes or hypercholesterolemia and reports no history of alcohol use or abuse.  The patient's BMI is 37.7.  Past Medical History:  Diagnosis Date   Anxiety    Arthritis    CAD (coronary artery disease)    a. 12/2018 NSTEMI/PCI: LM nl, LAD 30p, 44m (2.25x22 Resolute Onyx DES), LCX nl, OM2 nl, RCA 53m, RPDA 100 w/ L->R collats (likely culprit - med rx). EF 50-55%.   Claudication [facility])    a. 07/2019 - L>R.   Depression    GERD (gastroesophageal reflux disease)    History of cervical cancer    History of tobacco abuse    a. 30 pack year - quit 12/2018 @ time of NSTEMI.   HLD (hyperlipidemia)    Myocardial infarction (HCC)    Obesity    Other inflammations of eyelids    Positive colorectal cancer screening using Cologuard test 2019   Pre-diabetes    Sinus infection     Past Surgical History:  Procedure Laterality Date   CARDIAC CATHETERIZATION     COLONOSCOPY WITH PROPOFOL N/A 01/24/2018   Procedure: COLONOSCOPY WITH PROPOFOL;  Surgeon:  Earline Mayotte, MD;  Location: [facility] ENDOSCOPY;  Service: Endoscopy;  Laterality: N/A;   COLONOSCOPY WITH PROPOFOL N/A 06/09/2021   Procedure: COLONOSCOPY WITH PROPOFOL;  Surgeon: Earline Mayotte, MD;  Location: [facility] ENDOSCOPY;  Service: Endoscopy;  Laterality: N/A;   COLONOSCOPY WITH PROPOFOL N/A 07/20/2022   Procedure: COLONOSCOPY WITH PROPOFOL;  Surgeon: Earline Mayotte, MD;  Location: [facility] ENDOSCOPY;  Service: Endoscopy;  Laterality: N/A;   CORONARY STENT INTERVENTION N/A 01/07/2019   Procedure: CORONARY STENT INTERVENTION;  Surgeon: Iran Ouch, MD;  Location: [facility] INVASIVE CV LAB;  Service: Cardiovascular;  Laterality: N/A;  LAD   csection x2     DILATION AND CURETTAGE OF UTERUS     ESOPHAGOGASTRODUODENOSCOPY (EGD) WITH PROPOFOL N/A 06/09/2021   Procedure: ESOPHAGOGASTRODUODENOSCOPY (EGD) WITH PROPOFOL;  Surgeon: Earline Mayotte, MD;  Location: [facility] ENDOSCOPY;  Service: Endoscopy;  Laterality: N/A;   LEFT HEART CATH AND CORONARY ANGIOGRAPHY N/A 01/07/2019   Procedure: LEFT HEART CATH AND CORONARY ANGIOGRAPHY;  Surgeon: Iran Ouch, MD;  Location: [facility] INVASIVE CV LAB;  Service: Cardiovascular;  Laterality: N/A;   LIVER BIOPSY  04/26/2017   TONSILLECTOMY      Prior to Admission medications   Medication Sig Start Date End Date Taking? Authorizing Provider  aspirin EC 81 MG EC tablet Take  1 tablet (81 mg total) by mouth daily. 01/08/19  Yes Wieting, Richard, MD  atorvastatin (LIPITOR) 80 MG tablet Take 1 tablet (80 mg total) by mouth daily. 01/17/23  Yes Iran Ouch, MD  busPIRone (BUSPAR) 30 MG tablet Take 1 tablet (30 mg total) by mouth 3 (three) times daily. 09/07/22  Yes Kayla Shams, MD  clindamycin (CLEOCIN) 150 MG capsule Take 150 mg by mouth 4 (four) times daily. 10/22/21  Yes [provider]  metoprolol succinate (TOPROL-XL) 25 MG 24 hr tablet TAKE 1 TABLET(25 MG) BY MOUTH DAILY 01/17/23  Yes Iran Ouch, MD  mometasone (ELOCON) 0.1 % cream  Apply to ear canal twice daily as needed for itching 05/02/22  Yes Kayla Shams, MD  Multiple Vitamins-Minerals (PRESERVISION AREDS 2+MULTI VIT) CAPS Take by mouth.   Yes [provider]  omeprazole (PRILOSEC) 40 MG capsule TAKE 1 CAPSULE(40 MG) BY MOUTH DAILY 01/13/23  Yes Kayla Shams, MD  venlafaxine XR (EFFEXOR-XR) 75 MG 24 hr capsule Take 1 capsule (75 mg total) by mouth daily. 12/06/22  Yes Kayla Shams, MD    Family History  Problem Relation Age of Onset   Coronary artery disease Mother    Coronary artery disease Father        smoker   Breast cancer Neg Hx    Colon cancer Neg Hx      Social History   Tobacco Use   Smoking status: Former    Packs/day: 1.00    Years: 30.00    Additional pack years: 0.00    Total pack years: 30.00    Types: Cigarettes    Quit date: 01/06/2019    Years since quitting: 4.0   Smokeless tobacco: Never   Tobacco comments:    smokes one pack of cigarettes per day since age 29, quit during pregnancies.  Vaping Use   Vaping Use: Never used  Substance Use Topics   Alcohol use: No    Alcohol/week: 0.0 standard drinks of alcohol   Drug use: No    Allergies as of 01/24/2023 - Review Complete 11/28/2022  Allergen Reaction Noted   Codeine Itching 06/24/2015    Review of Systems:    All systems reviewed and negative except where noted in HPI.   Physical Exam:  BP 111/74 (BP Location: Right Arm, Patient Position: Sitting, Cuff Size: Large)   Pulse 86   Temp 98.1 F (36.7 C) (Oral)   Ht 5\' 3"  (1.6 m)   Wt 213 lb (96.6 kg)   LMP 10/13/2014 (Approximate)   BMI 37.73 kg/m  Patient's last menstrual period was 10/13/2014 (approximate). General:   Alert,  Well-developed, well-nourished, pleasant and cooperative in NAD Head:  Normocephalic and atraumatic. Eyes:  Sclera clear, no icterus.   Conjunctiva pink. Ears:  Normal auditory acuity. Neurologic:  Alert and oriented x3;  grossly normal neurologically. Skin:  Intact without  significant lesions or rashes.  No jaundice. Lymph Nodes:  No significant cervical adenopathy. Psych:  Alert and cooperative. Normal mood and affect.  Imaging Studies: No results found.  Assessment and Plan:   Kayla Farmer is a 57 y.o. y/o female who comes in with a history of having elevated alkaline phosphatase with normal AST and ALT and a history of seeing me in 2018 for the same.  At that time the patient had a negative workup for PBC and fractionation of the alkaline phosphatase showed equal distribution of the bone and the liver fraction of the alkaline  phosphatase.  The patient's biopsy did not show any cause for the elevated alkaline phosphatase and her fatty liver has not caused her to have an increase in her AST or ALT.  The patient will have her GGT checked and fractionation of the alkaline phosphatase again.  The patient has been told about weight loss and Mediterranean diet.  The patient will be contacted with the results of her labs.  The patient has been explained the plan agrees with it.     Midge Minium, MD. Clementeen Graham    Note: This dictation was prepared with Dragon dictation along with smaller phrase technology. Any transcriptional errors that result from this process are unintentional.

## 2023-01-30 LAB — ALKALINE PHOSPHATASE, ISOENZYMES
Alkaline Phosphatase: 243 IU/L — ABNORMAL HIGH (ref 44–121)
BONE FRACTION: 43 % (ref 14–68)
INTESTINAL FRAC.: 4 % (ref 0–18)
LIVER FRACTION: 53 % (ref 18–85)

## 2023-01-30 LAB — GAMMA GT: GGT: 19 IU/L (ref 0–60)

## 2023-02-02 NOTE — Addendum Note (Signed)
Addended by: Roena Malady on: 02/02/2023 10:57 AM   Modules accepted: Orders

## 2023-02-07 DIAGNOSIS — R748 Abnormal levels of other serum enzymes: Secondary | ICD-10-CM | POA: Diagnosis not present

## 2023-02-08 LAB — PARATHYROID HORMONE, INTACT (NO CA): PTH: 65 pg/mL (ref 15–65)

## 2023-02-21 ENCOUNTER — Ambulatory Visit
Admission: RE | Admit: 2023-02-21 | Discharge: 2023-02-21 | Disposition: A | Discharge status: Home / Self Care | Payer: Medicare Other | Source: Ambulatory Visit | Attending: Internal Medicine | Admitting: Internal Medicine

## 2023-02-21 DIAGNOSIS — Z1231 Encounter for screening mammogram for malignant neoplasm of breast: Secondary | ICD-10-CM | POA: Diagnosis not present

## 2023-03-06 ENCOUNTER — Telehealth: Payer: Self-pay

## 2023-03-06 NOTE — Telephone Encounter (Signed)
Patient states she is experiencing pain at the bottom of her stomach and it goes around to her hip and back.  Patient states if she sit down or lay down, she has a very difficult time getting up.  Patient states it started last week.  I transferred call to Access Nurse.

## 2023-03-06 NOTE — Telephone Encounter (Signed)
Access Nurse called back to state she recommended patient go to ED for severe abdominal pain on right side (9 of 10 on pain scale) and bloating.  Patient refused, and states she would rather come to our office or the chiropractor.  I called patient.  Patient refused offer for appointment at  [facility].  I scheduled her for our first available appointment, tomorrow with Dr. Dana Allan.

## 2023-03-06 NOTE — Telephone Encounter (Signed)
noted 

## 2023-03-07 ENCOUNTER — Encounter: Payer: Self-pay | Admitting: Family Medicine

## 2023-03-07 ENCOUNTER — Ambulatory Visit (INDEPENDENT_AMBULATORY_CARE_PROVIDER_SITE_OTHER): Payer: Medicare Other

## 2023-03-07 ENCOUNTER — Telehealth: Payer: Self-pay

## 2023-03-07 ENCOUNTER — Ambulatory Visit (INDEPENDENT_AMBULATORY_CARE_PROVIDER_SITE_OTHER): Payer: Medicare Other | Admitting: Family Medicine

## 2023-03-07 VITALS — BP 112/70 | HR 90 | Temp 97.8°F | Ht 63.0 in | Wt 211.8 lb

## 2023-03-07 DIAGNOSIS — M1611 Unilateral primary osteoarthritis, right hip: Secondary | ICD-10-CM | POA: Diagnosis not present

## 2023-03-07 DIAGNOSIS — M545 Low back pain, unspecified: Secondary | ICD-10-CM | POA: Diagnosis not present

## 2023-03-07 DIAGNOSIS — D509 Iron deficiency anemia, unspecified: Secondary | ICD-10-CM

## 2023-03-07 DIAGNOSIS — Z9181 History of falling: Secondary | ICD-10-CM | POA: Diagnosis not present

## 2023-03-07 DIAGNOSIS — M25551 Pain in right hip: Secondary | ICD-10-CM | POA: Diagnosis not present

## 2023-03-07 LAB — CBC WITH DIFFERENTIAL/PLATELET
Basophils Absolute: 0.1 10*3/uL (ref 0.0–0.1)
Basophils Relative: 0.7 % (ref 0.0–3.0)
Eosinophils Absolute: 0.1 10*3/uL (ref 0.0–0.7)
Eosinophils Relative: 1.5 % (ref 0.0–5.0)
HCT: 37.5 % (ref 36.0–46.0)
Hemoglobin: 11.8 g/dL — ABNORMAL LOW (ref 12.0–15.0)
Lymphocytes Relative: 28.2 % (ref 12.0–46.0)
Lymphs Abs: 2.4 10*3/uL (ref 0.7–4.0)
MCHC: 31.5 g/dL (ref 30.0–36.0)
MCV: 80.8 fl (ref 78.0–100.0)
Monocytes Absolute: 0.5 10*3/uL (ref 0.1–1.0)
Monocytes Relative: 5.6 % (ref 3.0–12.0)
Neutro Abs: 5.4 10*3/uL (ref 1.4–7.7)
Neutrophils Relative %: 64 % (ref 43.0–77.0)
Platelets: 262 10*3/uL (ref 150.0–400.0)
RBC: 4.64 Mil/uL (ref 3.87–5.11)
RDW: 15.7 % — ABNORMAL HIGH (ref 11.5–15.5)
WBC: 8.4 10*3/uL (ref 4.0–10.5)

## 2023-03-07 LAB — BASIC METABOLIC PANEL
BUN: 17 mg/dL (ref 6–23)
CO2: 28 mEq/L (ref 19–32)
Calcium: 9.7 mg/dL (ref 8.4–10.5)
Chloride: 102 mEq/L (ref 96–112)
Creatinine, Ser: 0.92 mg/dL (ref 0.40–1.20)
GFR: 68.31 mL/min (ref >60.00)
Glucose, Bld: 103 mg/dL — ABNORMAL HIGH (ref 70–99)
Potassium: 4.4 mEq/L (ref 3.5–5.1)
Sodium: 138 mEq/L (ref 135–145)

## 2023-03-07 LAB — POCT URINALYSIS DIPSTICK
Bilirubin, UA: NEGATIVE
Blood, UA: NEGATIVE
Glucose, UA: NEGATIVE
Ketones, UA: NEGATIVE
Leukocytes, UA: NEGATIVE
Nitrite, UA: NEGATIVE
Protein, UA: NEGATIVE
Spec Grav, UA: 1.02 (ref 1.010–1.025)
Urobilinogen, UA: 0.2 E.U./dL
pH, UA: 6 (ref 5.0–8.0)

## 2023-03-07 MED ORDER — CELECOXIB 100 MG PO CAPS
100.0000 mg | ORAL_CAPSULE | Freq: Two times a day (BID) | ORAL | 0 refills | Status: AC
Start: 2023-03-07 — End: 2023-03-17

## 2023-03-07 MED ORDER — OMEPRAZOLE 40 MG PO CPDR: DELAYED_RELEASE_CAPSULE | ORAL | 1 refills | Status: DC

## 2023-03-07 MED ORDER — PANTOPRAZOLE SODIUM 40 MG PO TBEC
40.0000 mg | DELAYED_RELEASE_TABLET | Freq: Every day | ORAL | 0 refills | Status: DC
Start: 2023-03-07 — End: 2023-03-07

## 2023-03-07 NOTE — Telephone Encounter (Signed)
Prescription Request  03/07/2023  LOV: Visit date not found  What is the name of the medication or equipment?  omeprazole (PRILOSEC) 40 MG capsule   Have you contacted your pharmacy to request a refill? No   Which pharmacy would you like this sent to?   [facility] - [place], [place] - [address] [place] [address] Phone: [phone] Fax: [phone]    Patient notified that their request is being sent to the clinical staff for review and that they should receive a response within 2 business days.   Please advise at Mobile [phone] (mobile)  Patient states she just saw Dr. Duncan Dull today and she was supposed to take two medications.  I let patient know that the Celebrex has been called in.  Patient states she needs the medication above.  Patient states she did not receive a shot today.

## 2023-03-07 NOTE — Patient Instructions (Addendum)
It was a pleasure meeting you today. Thank you for allowing me to take part in your health care.  Our goals for today as we discussed include:  We will get some labs and xray of right hip today.  If they are abnormal or we need to do something about them, I will call you.  If they are normal, I will send you a message on MyChart (if it is active) or a letter in the mail.  If you don't hear from [place] in 2 weeks, please call the office at the number below.   Take Tylenol 500 mg three times a day Heat/Ice as needed Lidocaine patch every 12 hours as needed  Start Celebrex 100 mg two times a day for 10 days.  Start this after 6 pm today. Start Prilosec daily while taking Celebrex.  Recommend Physical therapy referral pending results of hip xray  Follow up with PCP if no improvement   If you have any questions or concerns, please do not hesitate to call the office at [phone].  I look forward to our next visit and until then take care and stay safe.  Regards,   Dana Allan, MD   [facility]

## 2023-03-07 NOTE — Telephone Encounter (Signed)
Medication refill

## 2023-03-07 NOTE — Progress Notes (Signed)
SUBJECTIVE:   Chief Complaint  Patient presents with   Abdominal Pain    Lower right abdominal/pelvic pain that radiates to right lower back and right leg for 2 weeks   HPI Patient presents to clinic for an acute  Presents with lower abdominal pain that started June 16.  Reports radiates around lower back.  Endorses falling on Father's Day, lost balance and fell on right hip.  Has been feeling stiff on that side since.  Denies any fevers, diarrhea, bloody stool, constipation, dysuria, urinary frequency, hematuria, urinary or fecal incontinence, numbness/tingling, weakness in lower extremities nausea/vomiting, chest pain, shortness of breath.  Has not tried them for conservative measures.  PERTINENT PMH / PSH: Mild degenerative changes bilateral hips and L-spine NASH IBS   OBJECTIVE:  BP 112/70 (BP Location: Left Arm)   Pulse 90   Temp 97.8 F (36.6 C)   Ht 5\' 3"  (1.6 m)   Wt 211 lb 12.8 oz (96.1 kg)   LMP 10/13/2014 (Approximate)   SpO2 94%   BMI 37.52 kg/m    Physical Exam Vitals reviewed.  Constitutional:      General: She is not in acute distress.    Appearance: Normal appearance. She is normal weight. She is not ill-appearing, toxic-appearing or diaphoretic.  HENT:     Mouth/Throat:     Mouth: Mucous membranes are moist.  Eyes:     General:        Right eye: No discharge.        Left eye: No discharge.     Conjunctiva/sclera: Conjunctivae normal.  Cardiovascular:     Rate and Rhythm: Normal rate and regular rhythm.     Heart sounds: Normal heart sounds.  Pulmonary:     Effort: Pulmonary effort is normal.     Breath sounds: Normal breath sounds.  Abdominal:     General: Abdomen is protuberant. Bowel sounds are normal. There is no distension or abdominal bruit.     Palpations: Abdomen is soft. There is no mass.     Tenderness: There is abdominal tenderness. There is no right CVA tenderness, left CVA tenderness, guarding or rebound. Negative signs include  Murphy's sign, McBurney's sign, psoas sign and obturator sign.     Hernia: No hernia is present.  Musculoskeletal:        General: Normal range of motion.     Thoracic back: Tenderness present. No bony tenderness. Normal range of motion.     Lumbar back: Normal. Negative right straight leg raise test and negative left straight leg raise test.     Right hip: Normal. Normal strength.     Left hip: Normal. Normal strength.     Right upper leg: Normal.     Left upper leg: Normal.     Right knee: Normal.     Left knee: Normal.  Skin:    General: Skin is warm and dry.  Neurological:     General: No focal deficit present.     Mental Status: She is alert and oriented to person, place, and time. Mental status is at baseline.  Psychiatric:        Mood and Affect: Mood normal.        Behavior: Behavior normal.        Thought Content: Thought content normal.        Judgment: Judgment normal.        03/07/2023    9:31 AM 05/02/2022    8:48 AM 04/16/2021    9:48  AM 09/21/2020    8:53 AM 04/27/2020   11:00 AM  Depression screen PHQ 2/9  Decreased Interest 3 1 3  0 1  Down, Depressed, Hopeless 0 0 2 0 1  PHQ - 2 Score 3 1 5  0 2  Altered sleeping 0  3  0  Tired, decreased energy 3  1  1   Change in appetite 0  0  1  Feeling bad or failure about yourself  0  0    Trouble concentrating 3  3  0  Moving slowly or fidgety/restless 0  0  0  Suicidal thoughts 0  0  0  PHQ-9 Score 9  12  4   Difficult doing work/chores   Somewhat difficult        03/07/2023    9:32 AM 04/16/2021    9:49 AM  GAD 7 : Generalized Anxiety Score  Nervous, Anxious, on Edge 0 3  Control/stop worrying 0 3  Worry too much - different things 0 3  Trouble relaxing 3 2  Restless 0 3  Easily annoyed or irritable 0 2  Afraid - awful might happen 0 0  Total GAD 7 Score 3 16  Anxiety Difficulty  Somewhat difficult    ASSESSMENT/PLAN:  Acute bilateral low back pain without sciatica Assessment & Plan: Right lower back  tenderness status post fall without sciatica No CVA tenderness Urine negative for leukocytes, nitrates, RBCs Suspect MSK injury Start Celebrex and continue Prilosec Take Tylenol 500 mg three times a day Heat/Ice as needed Lidocaine patch every 12 hours as needed Follow-up with PCP if no improvement  Orders: -     CBC with Differential/Platelet -     Basic metabolic panel -     POCT urinalysis dipstick  Right hip pain Assessment & Plan: Low suspicion for acute abdomen given no peritoneal signs on exam and no abdominal symptom.  Given recent fall on right hip suspect right lower abdominal pain referred pain from MSK injury. Check CBC, BMET Urine negative for leukocytes, nitrates or blood. Right hip x-ray Start Celebrex 100 mg twice daily x 10 days Continue Prilosec 40 mg daily Take Tylenol 500 mg three times a day Heat/Ice as needed Lidocaine patch every 12 hours as needed Recommend PT referral pending results of x-ray Follow-up with PCP if no improvement or worsening symptoms.  Orders: -     DG HIP UNILAT W OR W/O PELVIS 2-3 VIEWS RIGHT; Future -     Celecoxib; Take 1 capsule (100 mg total) by mouth 2 (two) times daily for 10 days.  Dispense: 20 capsule; Refill: 0  Iron deficiency anemia, unspecified iron deficiency anemia type Assessment & Plan: Recent hemoglobin 11.8, improved from previously Mentzer index 17.9, likely IDA Recommend continue daily multivitamins Colonoscopy up-to-date.  Repeat in 5 years.  Due 2028 Follow-up with PCP    PDMP reviewed  Return if symptoms worsen or fail to improve, for PCP.  Dana Allan, MD

## 2023-03-10 ENCOUNTER — Other Ambulatory Visit: Payer: Self-pay | Admitting: Family Medicine

## 2023-03-10 ENCOUNTER — Telehealth: Payer: Self-pay | Admitting: Internal Medicine

## 2023-03-10 NOTE — Telephone Encounter (Signed)
Spoke with pt to let her know that the xray has not been read by the radiologist yet but as soon as it is we would give her a call with the results.

## 2023-03-10 NOTE — Telephone Encounter (Signed)
Pt called stating she would like to know her lab and xray results from tuesday

## 2023-03-12 ENCOUNTER — Encounter: Payer: Self-pay | Admitting: Family Medicine

## 2023-03-12 DIAGNOSIS — M25551 Pain in right hip: Secondary | ICD-10-CM | POA: Insufficient documentation

## 2023-03-12 DIAGNOSIS — M545 Low back pain, unspecified: Secondary | ICD-10-CM | POA: Insufficient documentation

## 2023-03-12 NOTE — Assessment & Plan Note (Signed)
Right lower back tenderness status post fall without sciatica No CVA tenderness Urine negative for leukocytes, nitrates, RBCs Suspect MSK injury Start Celebrex and continue Prilosec Take Tylenol 500 mg three times a day Heat/Ice as needed Lidocaine patch every 12 hours as needed Follow-up with PCP if no improvement

## 2023-03-12 NOTE — Assessment & Plan Note (Signed)
Recent hemoglobin 11.8, improved from previously Mentzer index 17.9, likely IDA Recommend continue daily multivitamins Colonoscopy up-to-date.  Repeat in 5 years.  Due 2028 Follow-up with PCP

## 2023-03-12 NOTE — Assessment & Plan Note (Addendum)
Low suspicion for acute abdomen given no peritoneal signs on exam and no abdominal symptom.  Given recent fall on right hip suspect right lower abdominal pain referred pain from MSK injury. Check CBC, BMET Urine negative for leukocytes, nitrates or blood. Right hip x-ray Start Celebrex 100 mg twice daily x 10 days Continue Prilosec 40 mg daily Take Tylenol 500 mg three times a day Heat/Ice as needed Lidocaine patch every 12 hours as needed Recommend PT referral pending results of x-ray Follow-up with PCP if no improvement or worsening symptoms.

## 2023-03-28 ENCOUNTER — Ambulatory Visit: Payer: Medicare Other | Attending: Cardiovascular Disease | Admitting: Cardiovascular Disease

## 2023-03-28 ENCOUNTER — Encounter: Payer: Self-pay | Admitting: Cardiovascular Disease

## 2023-03-28 VITALS — BP 122/84 | HR 80 | Ht 63.0 in | Wt 210.1 lb

## 2023-03-28 DIAGNOSIS — I25118 Atherosclerotic heart disease of native coronary artery with other forms of angina pectoris: Secondary | ICD-10-CM

## 2023-03-28 DIAGNOSIS — E669 Obesity, unspecified: Secondary | ICD-10-CM

## 2023-03-28 DIAGNOSIS — Z6837 Body mass index (BMI) 37.0-37.9, adult: Secondary | ICD-10-CM | POA: Diagnosis not present

## 2023-03-28 DIAGNOSIS — E785 Hyperlipidemia, unspecified: Secondary | ICD-10-CM | POA: Diagnosis not present

## 2023-03-28 NOTE — Patient Instructions (Signed)
Medication Instructions:  STOP the Metoprolol Succinate   *If you need a refill on your cardiac medications before your next appointment, please call your pharmacy*   Lab Work: None ordered If you have labs (blood work) drawn today and your tests are completely normal, you will receive your results only by: MyChart Message (if you have MyChart) OR A paper copy in the mail If you have any lab test that is abnormal or we need to change your treatment, we will call you to review the results.   Testing/Procedures: None ordered   Follow-Up: At [facility], you and your health needs are our priority.  As part of our continuing mission to provide you with exceptional heart care, we have created designated Provider Care Teams.  These Care Teams include your primary Cardiologist (physician) and Advanced Practice Providers (APPs -  Physician Assistants and Nurse Practitioners) who all work together to provide you with the care you need, when you need it.  We recommend signing up for the patient portal called "MyChart".  Sign up information is provided on this After Visit Summary.  MyChart is used to connect with patients for Virtual Visits (Telemedicine).  Patients are able to view lab/test results, encounter notes, upcoming appointments, etc.  Non-urgent messages can be sent to your provider as well.   To learn more about what you can do with MyChart, go to [email or web address].    Your next appointment:   12 month(s)  Provider:   You may see Lorine Bears, MD or one of the following Advanced Practice Providers on your designated Care Team:   Nicolasa Ducking, NP Eula Listen, PA-C Cadence Fransico Michael, PA-C Charlsie Quest, NP

## 2023-03-28 NOTE — Progress Notes (Signed)
Cardiology Office Note   Date:  03/28/2023   ID:  Kayla Farmer, DOB 23-Oct-1965, MRN [id number]  PCP:  Sherlene Shams, MD  Cardiologist:   Lorine Bears, MD   Chief Complaint  Patient presents with   Follow-up    12 month f/u c/o dizziness/nausea/vomiting and right arm soreness. Pt concerned about flying in Sept. Meds reviewed verbally with pt.       History of Present Illness: Kayla Farmer is a 57 y.o. female who presents for a follow-up visit regarding coronary artery disease. She has known history of tobacco use, hyperlipidemia, anxiety and family history of coronary artery disease.  She presented in April 2020 with non-ST elevation myocardial infarction.  Cardiac catheterization showed significant two-vessel coronary artery disease with an occluded mid right PDA with left-to-right collaterals.  This was felt to be the culprit but the vessel was small in size and already had collaterals and thus left to be treated medically.  There was significant stenosis in the mid LAD which was treated successfully with PCI and drug-eluting stent placement.  Ejection fraction was 50 to 55%. She quit smoking after her myocardial infarction.  Repeat echocardiogram in December 2020 showed normal LV systolic function with an EF of 55 to 60% with no significant valvular abnormalities. Lower extremity arterial Doppler in December 2020 showed normal ABI and toe pressure and normal waveforms.  She has been doing well overall with no recent chest pain.  She was exposed to excessive heat over the weekend and felt nauseous yesterday but feels better today.  She wants to lose more weight but has not been able to do so.  Past Medical History:  Diagnosis Date   Anxiety    Arthritis    CAD (coronary artery disease)    a. 12/2018 NSTEMI/PCI: LM nl, LAD 30p, 63m (2.25x22 Resolute Onyx DES), LCX nl, OM2 nl, RCA 23m, RPDA 100 w/ L->R collats (likely culprit - med rx). EF 50-55%.   Claudication [facility])    a.  07/2019 - L>R.   Depression    GERD (gastroesophageal reflux disease)    History of cervical cancer    History of tobacco abuse    a. 30 pack year - quit 12/2018 @ time of NSTEMI.   HLD (hyperlipidemia)    Myocardial infarction (HCC)    Obesity    Other inflammations of eyelids    Positive colorectal cancer screening using Cologuard test 2019   Pre-diabetes    Sinus infection     Past Surgical History:  Procedure Laterality Date   CARDIAC CATHETERIZATION     COLONOSCOPY WITH PROPOFOL N/A 01/24/2018   Procedure: COLONOSCOPY WITH PROPOFOL;  Surgeon: Earline Mayotte, MD;  Location: [facility] ENDOSCOPY;  Service: Endoscopy;  Laterality: N/A;   COLONOSCOPY WITH PROPOFOL N/A 06/09/2021   Procedure: COLONOSCOPY WITH PROPOFOL;  Surgeon: Earline Mayotte, MD;  Location: [facility] ENDOSCOPY;  Service: Endoscopy;  Laterality: N/A;   COLONOSCOPY WITH PROPOFOL N/A 07/20/2022   Procedure: COLONOSCOPY WITH PROPOFOL;  Surgeon: Earline Mayotte, MD;  Location: [facility] ENDOSCOPY;  Service: Endoscopy;  Laterality: N/A;   CORONARY STENT INTERVENTION N/A 01/07/2019   Procedure: CORONARY STENT INTERVENTION;  Surgeon: Iran Ouch, MD;  Location: [facility] INVASIVE CV LAB;  Service: Cardiovascular;  Laterality: N/A;  LAD   csection x2     DILATION AND CURETTAGE OF UTERUS     ESOPHAGOGASTRODUODENOSCOPY (EGD) WITH PROPOFOL N/A 06/09/2021   Procedure: ESOPHAGOGASTRODUODENOSCOPY (EGD) WITH PROPOFOL;  Surgeon: Donnalee Curry  W, MD;  Location: [facility] ENDOSCOPY;  Service: Endoscopy;  Laterality: N/A;   LEFT HEART CATH AND CORONARY ANGIOGRAPHY N/A 01/07/2019   Procedure: LEFT HEART CATH AND CORONARY ANGIOGRAPHY;  Surgeon: Iran Ouch, MD;  Location: [facility] INVASIVE CV LAB;  Service: Cardiovascular;  Laterality: N/A;   LIVER BIOPSY  04/26/2017   TONSILLECTOMY       Current Outpatient Medications  Medication Sig Dispense Refill   aspirin EC 81 MG EC tablet Take 1 tablet (81 mg total) by mouth daily. 30 tablet 0    atorvastatin (LIPITOR) 80 MG tablet Take 1 tablet (80 mg total) by mouth daily. 90 tablet 0   busPIRone (BUSPAR) 30 MG tablet Take 1 tablet (30 mg total) by mouth 3 (three) times daily. 90 tablet 1   clindamycin (CLEOCIN) 150 MG capsule Take 150 mg by mouth 4 (four) times daily.     Cyanocobalamin (B-12 PO) Take by mouth daily in the afternoon.     mometasone (ELOCON) 0.1 % cream Apply to ear canal twice daily as needed for itching 15 g 1   Multiple Vitamins-Minerals (PRESERVISION AREDS 2+MULTI VIT) CAPS Take by mouth.     omeprazole (PRILOSEC) 40 MG capsule TAKE 1 CAPSULE(40 MG) BY MOUTH DAILY 90 capsule 1   venlafaxine XR (EFFEXOR-XR) 75 MG 24 hr capsule Take 1 capsule (75 mg total) by mouth daily. 90 capsule 1   No current facility-administered medications for this visit.    Allergies:   Codeine    Social History:  The patient  reports that she quit smoking about 4 years ago. Her smoking use included cigarettes. She started smoking about 34 years ago. She has a 30 pack-year smoking history. She has never used smokeless tobacco. She reports that she does not drink alcohol and does not use drugs.   Family History:  The patient's family history includes Coronary artery disease in her father and mother.    ROS:  Please see the history of present illness.   Otherwise, review of systems are positive for none.   All other systems are reviewed and negative.    PHYSICAL EXAM: VS:  BP 122/84 (BP Location: Left Arm, Patient Position: Sitting, Cuff Size: Large)   Pulse 80   Ht 5\' 3"  (1.6 m)   Wt 210 lb 2 oz (95.3 kg)   LMP 10/13/2014 (Approximate)   SpO2 96%   BMI 37.22 kg/m  , BMI Body mass index is 37.22 kg/m. GEN: Well nourished, well developed, in no acute distress  HEENT: normal  Neck: no JVD, carotid bruits, or masses Cardiac: RRR; no murmurs, rubs, or gallops,no edema  Respiratory:  clear to auscultation bilaterally, normal work of breathing GI: soft, nontender, nondistended, +  BS MS: no deformity or atrophy  Skin: warm and dry, no rash Neuro:  Strength and sensation are intact Psych: euthymic mood, full affect   EKG:  EKG is ordered today. EKG showed normal sinus rhythm with no significant ST or T wave changes.   Recent Labs: 11/28/2022: ALT 24 03/07/2023: BUN 17; Creatinine, Ser 0.92; Hemoglobin 11.8; Platelets 262.0; Potassium 4.4; Sodium 138    Lipid Panel    Component Value Date/Time   CHOL 128 11/28/2022 0852   TRIG 128.0 11/28/2022 0852   HDL 42.70 11/28/2022 0852   CHOLHDL 3 11/28/2022 0852   VLDL 25.6 11/28/2022 0852   LDLCALC 60 11/28/2022 0852   LDLDIRECT 65.0 11/28/2022 0852      Wt Readings from Last 3 Encounters:  03/28/23  210 lb 2 oz (95.3 kg)  03/07/23 211 lb 12.8 oz (96.1 kg)  01/24/23 213 lb (96.6 kg)           No data to display            ASSESSMENT AND PLAN:  1.  Coronary artery disease involving native coronary arteries without angina: She is doing well overall with no symptoms of angina.   Continue aspirin indefinitely.  Given that her ejection fraction was normal and her struggles with obesity, I elected to discontinue small dose Toprol.  2.  Hyperlipidemia: Continue high dose atorvastatin.  Most recent lipid profile showed an LDL of 65.  3.  Obesity with a BMI of 37: The patient gained weight since she quit smoking in 2020.  I had a prolonged discussion with her about the importance of healthy diet and regular exercise.  I recommended at least 30 minutes of exercise daily.   Disposition:   FU with me in 12 months  Signed,  Lorine Bears, MD  03/28/2023 4:17 PM     [facility]

## 2023-05-26 ENCOUNTER — Other Ambulatory Visit: Payer: Self-pay | Admitting: Internal Medicine

## 2023-06-19 ENCOUNTER — Ambulatory Visit: Payer: Medicare Other | Admitting: Internal Medicine

## 2023-07-04 ENCOUNTER — Encounter: Payer: Self-pay | Admitting: Internal Medicine

## 2023-07-04 ENCOUNTER — Ambulatory Visit: Payer: Medicare Other | Admitting: Internal Medicine

## 2023-07-04 VITALS — BP 116/70 | HR 113 | Ht 63.0 in | Wt 211.0 lb

## 2023-07-04 DIAGNOSIS — R5383 Other fatigue: Secondary | ICD-10-CM | POA: Diagnosis not present

## 2023-07-04 DIAGNOSIS — E785 Hyperlipidemia, unspecified: Secondary | ICD-10-CM | POA: Diagnosis not present

## 2023-07-04 DIAGNOSIS — Z6837 Body mass index (BMI) 37.0-37.9, adult: Secondary | ICD-10-CM

## 2023-07-04 DIAGNOSIS — I25118 Atherosclerotic heart disease of native coronary artery with other forms of angina pectoris: Secondary | ICD-10-CM

## 2023-07-04 DIAGNOSIS — R748 Abnormal levels of other serum enzymes: Secondary | ICD-10-CM | POA: Diagnosis not present

## 2023-07-04 DIAGNOSIS — F411 Generalized anxiety disorder: Secondary | ICD-10-CM

## 2023-07-04 DIAGNOSIS — E1169 Type 2 diabetes mellitus with other specified complication: Secondary | ICD-10-CM | POA: Diagnosis not present

## 2023-07-04 DIAGNOSIS — E669 Obesity, unspecified: Secondary | ICD-10-CM

## 2023-07-04 DIAGNOSIS — Z124 Encounter for screening for malignant neoplasm of cervix: Secondary | ICD-10-CM | POA: Diagnosis not present

## 2023-07-04 DIAGNOSIS — K7581 Nonalcoholic steatohepatitis (NASH): Secondary | ICD-10-CM

## 2023-07-04 NOTE — Assessment & Plan Note (Signed)
Managed with diet per patient preference. In spite of fatty liver and CAD, she refuses metformin and Jardiance .despite review of the metabolic and renal benefits.  She has declined nutritional diabetes education . Continue statin, ASA  and ARB  for secondary prevention of CAD.  She declines all vaccinations against streptococcal  pneumonia despite my recommendations  Lab Results  Component Value Date   HGBA1C 6.4 11/28/2022   Lab Results  Component Value Date   MICROALBUR 1.0 11/28/2022   MICROALBUR 1.3 05/02/2022

## 2023-07-04 NOTE — Progress Notes (Addendum)
Subjective:  Patient ID: Kayla Farmer, female    DOB: 1965-11-25  Age: 57 y.o. MRN: [id number]  CC: The primary encounter diagnosis was Type 2 diabetes mellitus with obesity (HCC). Diagnoses of Hyperlipidemia LDL goal <70, Other fatigue, Cervical cancer screening, Coronary artery disease of native artery of native heart with stable angina pectoris (HCC), Generalized anxiety disorder, Elevated alkaline phosphatase level, and NASH (nonalcoholic steatohepatitis) were also pertinent to this visit.   HPI Kayla Farmer presents for  Chief Complaint  Patient presents with   Medical Management of Chronic Issues   1) Diabetes complicated by obesity   and noncompliance.     Walks for exercise 3 days per week . Obesity unchanged.  Not interested in GLP 1 agonist   2) Anxiety :  complicated by lack of education and feelings of inferiority , raised by her grandmother bc Mom had her at a young age and was unable to care for her.  Patient continues to be tearful when talking about her grown children ,  2 sons 42 and 28 .  Dtr living with her currently , 56.  Employed .  Still living with husband Kayla Farmer, but they are estranged emotionally. Taking care of mother who has a lot of health problems,  hospitalizations and "can' see"  despite cataract surgery ,  so she can no longer drive,    3)   has a small  SQ  lump on instep of right foot that is painful if bumped.  Nondescript appearing. No history of trauma .   4)  Foot cramps intermittent .  Does not drink water .  Drinks pepsi,  chocolate milk.      Outpatient Medications Prior to Visit  Medication Sig Dispense Refill   aspirin EC 81 MG EC tablet Take 1 tablet (81 mg total) by mouth daily. 30 tablet 0   atorvastatin (LIPITOR) 80 MG tablet Take 1 tablet (80 mg total) by mouth daily. 90 tablet 0   busPIRone (BUSPAR) 30 MG tablet TAKE 1 TABLET BY MOUTH 3 TIMES DAILY 90 tablet 1   Multiple Vitamins-Minerals (PRESERVISION AREDS 2+MULTI VIT) CAPS Take by mouth.  (Patient not taking: Reported on 07/17/2023)     omeprazole (PRILOSEC) 40 MG capsule TAKE 1 CAPSULE(40 MG) BY MOUTH DAILY 90 capsule 1   venlafaxine XR (EFFEXOR-XR) 75 MG 24 hr capsule TAKE 1 CAPSULE BY MOUTH DAILY 90 capsule 1   clindamycin (CLEOCIN) 150 MG capsule Take 150 mg by mouth 4 (four) times daily. (Patient not taking: Reported on 07/04/2023)     Cyanocobalamin (B-12 PO) Take by mouth daily in the afternoon. (Patient not taking: Reported on 07/04/2023)     mometasone (ELOCON) 0.1 % cream Apply to ear canal twice daily as needed for itching (Patient not taking: Reported on 07/04/2023) 15 g 1   No facility-administered medications prior to visit.    Review of Systems;  Patient denies headache, fevers, malaise, unintentional weight loss, skin rash, eye pain, sinus congestion and sinus pain, sore throat, dysphagia,  hemoptysis , cough, dyspnea, wheezing, chest pain, palpitations, orthopnea, edema, abdominal pain, nausea, melena, diarrhea, constipation, flank pain, dysuria, hematuria, urinary  Frequency, nocturia, numbness, tingling, seizures,  Focal weakness, Loss of consciousness,  Tremor, insomnia, depression, anxiety, and suicidal ideation.      Objective:  BP 116/70   Pulse (!) 113   Ht 5\' 3"  (1.6 m)   Wt 211 lb (95.7 kg)   LMP 10/13/2014 (Approximate)   SpO2 93%  BMI 37.38 kg/m   BP Readings from Last 3 Encounters:  07/17/23 128/77  07/04/23 116/70  03/28/23 122/84    Wt Readings from Last 3 Encounters:  07/17/23 215 lb 4.8 oz (97.7 kg)  07/04/23 211 lb (95.7 kg)  03/28/23 210 lb 2 oz (95.3 kg)    Physical Exam Vitals reviewed.  Constitutional:      General: She is not in acute distress.    Appearance: Normal appearance. She is obese. She is not ill-appearing, toxic-appearing or diaphoretic.  HENT:     Head: Normocephalic.  Eyes:     General: No scleral icterus.       Right eye: No discharge.        Left eye: No discharge.     Conjunctiva/sclera:  Conjunctivae normal.  Cardiovascular:     Rate and Rhythm: Normal rate and regular rhythm.     Heart sounds: Normal heart sounds.  Pulmonary:     Effort: Pulmonary effort is normal. No respiratory distress.     Breath sounds: Normal breath sounds.  Musculoskeletal:        General: Normal range of motion.  Skin:    General: Skin is warm and dry.  Neurological:     General: No focal deficit present.     Mental Status: She is alert and oriented to person, place, and time. Mental status is at baseline.  Psychiatric:        Mood and Affect: Mood normal.        Behavior: Behavior normal.        Thought Content: Thought content normal.        Judgment: Judgment normal.      Lab Results  Component Value Date   HGBA1C 6.5 07/17/2023   HGBA1C 6.4 11/28/2022   HGBA1C 6.5 05/02/2022    Lab Results  Component Value Date   CREATININE 0.85 07/17/2023   CREATININE 0.92 03/07/2023   CREATININE 0.88 11/28/2022    Lab Results  Component Value Date   WBC 8.4 03/07/2023   HGB 11.8 (L) 03/07/2023   HCT 37.5 03/07/2023   PLT 262.0 03/07/2023   GLUCOSE 97 07/17/2023   CHOL 140 07/17/2023   TRIG 131.0 07/17/2023   HDL 46.90 07/17/2023   LDLDIRECT 77.0 07/17/2023   LDLCALC 67 07/17/2023   ALT 19 07/17/2023   AST 20 07/17/2023   NA 140 07/17/2023   K 4.1 07/17/2023   CL 104 07/17/2023   CREATININE 0.85 07/17/2023   BUN 14 07/17/2023   CO2 27 07/17/2023   TSH 2.73 07/17/2023   INR 1.0 01/07/2019   HGBA1C 6.5 07/17/2023   MICROALBUR 1.0 11/28/2022    MM 3D SCREENING MAMMOGRAM BILATERAL BREAST  Result Date: 02/22/2023 CLINICAL DATA:  Screening. EXAM: DIGITAL SCREENING BILATERAL MAMMOGRAM WITH TOMOSYNTHESIS AND CAD TECHNIQUE: Bilateral screening digital craniocaudal and mediolateral oblique mammograms were obtained. Bilateral screening digital breast tomosynthesis was performed. The images were evaluated with computer-aided detection. COMPARISON:  Previous exam(s). ACR Breast  Density Category b: There are scattered areas of fibroglandular density. FINDINGS: There are no findings suspicious for malignancy. IMPRESSION: No mammographic evidence of malignancy. A result letter of this screening mammogram will be mailed directly to the patient. RECOMMENDATION: Screening mammogram in one year. (Code:SM-B-01Y) BI-RADS CATEGORY  1: Negative. Electronically Signed   By: Emmaline Kluver M.D.   On: 02/22/2023 12:39    Assessment & Plan:  .Type 2 diabetes mellitus with obesity (HCC) Assessment & Plan: Managed with diet per patient preference. In  spite of fatty liver and CAD, she refuses metformin and Jardiance .despite review of the metabolic and renal benefits.  She has declined nutritional diabetes education . Continue statin, ASA  and ARB  for secondary prevention of CAD.  She declines all vaccinations against streptococcal  pneumonia despite my recommendations  Lab Results  Component Value Date   HGBA1C 6.4 11/28/2022   Lab Results  Component Value Date   MICROALBUR 1.0 11/28/2022   MICROALBUR 1.3 05/02/2022        Orders: -     Comprehensive metabolic panel; Future -     Hemoglobin A1c; Future  Hyperlipidemia LDL goal <70 -     Lipid panel; Future -     LDL cholesterol, direct; Future  Other fatigue -     TSH; Future  Cervical cancer screening -     Ambulatory referral to Gynecology; Future  Coronary artery disease of native artery of native heart with stable angina pectoris [facility]) Assessment & Plan: She is asymptomatic and remains tobacco free.  LShe continues to decline trial of Jardiance despite discussion of the benefits,  She is taking metoprolol, statin and ASA   Lab Results  Component Value Date   CHOL 128 11/28/2022   HDL 42.70 11/28/2022   LDLCALC 60 11/28/2022   LDLDIRECT 65.0 11/28/2022   TRIG 128.0 11/28/2022   CHOLHDL 3 11/28/2022      Generalized anxiety disorder Assessment & Plan: Chronic,  Acquired in adolescence and  aggravated by a feeling of inferiority because of her lack of education.  She has a basic mistrust of the medical establishment .   She declines change in  treatment and referral to psychiatry . Continue buspirone and effexor.    Elevated alkaline phosphatase level Assessment & Plan: Repeat GI evaluation made with Dr Servando Snare.  Etiology remains  unclear.  She has had a eepeatedly normal  fractionation ,  a normal GGT, a   PTH 65 (ULN) , calcium has been repeatedly normal . Liver biopsy in 2018 noted minimal fatty liver. Continue to follow ,   NASH (nonalcoholic steatohepatitis) Assessment & Plan: Minimal By biopsy in 2018 , with persistently elevated alk phos, normal f=AST/ALT,  normal alk phos fractionation ,  normal GGT by recent GI repeat evaluation .  Weight loss advised.       I provided 32 minutes of face-to-face time during this encounter reviewing patient's last visit with me, patient's  most recent visit with GI,  cardiology,  previous  labs and imaging studies, counseling on currently addressed issues,  and post visit ordering to diagnostics and therapeutics .   Follow-up: Return in about 6 months (around 01/02/2024) for follow up diabetes.   Sherlene Shams, MD

## 2023-07-04 NOTE — Assessment & Plan Note (Signed)
She is asymptomatic and remains tobacco free.  LShe continues to decline trial of Jardiance despite discussion of the benefits,  She is taking metoprolol, statin and ASA   Lab Results  Component Value Date   CHOL 128 11/28/2022   HDL 42.70 11/28/2022   LDLCALC 60 11/28/2022   LDLDIRECT 65.0 11/28/2022   TRIG 128.0 11/28/2022   CHOLHDL 3 11/28/2022

## 2023-07-04 NOTE — Patient Instructions (Signed)
Please find out what type of thyroid cancer your cousin has..  it may be relevant to your future use of Ozempic or Mounjaro  Return for labs when you are hydrated!  You do not need to fast    Referral for PAP smear is in progress

## 2023-07-05 NOTE — Assessment & Plan Note (Signed)
Chronic,  Acquired in adolescence and aggravated by a feeling of inferiority because of her lack of education.  She has a basic mistrust of the medical establishment .   She declines change in  treatment and referral to psychiatry . Continue buspirone and effexor.

## 2023-07-11 ENCOUNTER — Other Ambulatory Visit: Payer: Medicare Other

## 2023-07-17 ENCOUNTER — Encounter: Payer: Self-pay | Admitting: Licensed Practical Nurse

## 2023-07-17 ENCOUNTER — Other Ambulatory Visit (INDEPENDENT_AMBULATORY_CARE_PROVIDER_SITE_OTHER): Payer: Medicare Other

## 2023-07-17 ENCOUNTER — Other Ambulatory Visit (HOSPITAL_COMMUNITY)
Admission: RE | Admit: 2023-07-17 | Discharge: 2023-07-17 | Disposition: A | Discharge status: Home / Self Care | Payer: Medicare Other | Source: Ambulatory Visit | Attending: Family Medicine | Admitting: Family Medicine

## 2023-07-17 ENCOUNTER — Ambulatory Visit: Payer: Medicare Other | Admitting: Licensed Practical Nurse

## 2023-07-17 VITALS — BP 128/77 | HR 99 | Ht 63.0 in | Wt 215.3 lb

## 2023-07-17 DIAGNOSIS — Z01419 Encounter for gynecological examination (general) (routine) without abnormal findings: Secondary | ICD-10-CM | POA: Diagnosis present

## 2023-07-17 DIAGNOSIS — R5383 Other fatigue: Secondary | ICD-10-CM | POA: Diagnosis not present

## 2023-07-17 DIAGNOSIS — E669 Obesity, unspecified: Secondary | ICD-10-CM

## 2023-07-17 DIAGNOSIS — E1169 Type 2 diabetes mellitus with other specified complication: Secondary | ICD-10-CM

## 2023-07-17 DIAGNOSIS — E785 Hyperlipidemia, unspecified: Secondary | ICD-10-CM | POA: Diagnosis not present

## 2023-07-17 DIAGNOSIS — Z124 Encounter for screening for malignant neoplasm of cervix: Secondary | ICD-10-CM | POA: Insufficient documentation

## 2023-07-17 DIAGNOSIS — Z1151 Encounter for screening for human papillomavirus (HPV): Secondary | ICD-10-CM | POA: Diagnosis not present

## 2023-07-17 LAB — LIPID PANEL
Cholesterol: 140 mg/dL (ref 0–200)
HDL: 46.9 mg/dL (ref >39.00)
LDL Cholesterol: 67 mg/dL (ref 0–99)
NonHDL: 92.97
Total CHOL/HDL Ratio: 3
Triglycerides: 131 mg/dL (ref 0.0–149.0)
VLDL: 26.2 mg/dL (ref 0.0–40.0)

## 2023-07-17 LAB — HEMOGLOBIN A1C: Hgb A1c MFr Bld: 6.5 % (ref 4.6–6.5)

## 2023-07-17 LAB — COMPREHENSIVE METABOLIC PANEL
ALT: 19 U/L (ref 0–35)
AST: 20 U/L (ref 0–37)
Albumin: 3.9 g/dL (ref 3.5–5.2)
Alkaline Phosphatase: 201 U/L — ABNORMAL HIGH (ref 39–117)
BUN: 14 mg/dL (ref 6–23)
CO2: 27 meq/L (ref 19–32)
Calcium: 9.3 mg/dL (ref 8.4–10.5)
Chloride: 104 meq/L (ref 96–112)
Creatinine, Ser: 0.85 mg/dL (ref 0.40–1.20)
GFR: 74.92 mL/min (ref >60.00)
Glucose, Bld: 97 mg/dL (ref 70–99)
Potassium: 4.1 meq/L (ref 3.5–5.1)
Sodium: 140 meq/L (ref 135–145)
Total Bilirubin: 0.5 mg/dL (ref 0.2–1.2)
Total Protein: 7.4 g/dL (ref 6.0–8.3)

## 2023-07-17 LAB — TSH: TSH: 2.73 u[IU]/mL (ref 0.35–5.50)

## 2023-07-17 LAB — LDL CHOLESTEROL, DIRECT: Direct LDL: 77 mg/dL

## 2023-07-17 NOTE — Progress Notes (Signed)
Gynecology Annual Exam  PCP: Sherlene Shams, MD  Chief Complaint:  Chief Complaint  Patient presents with   Gynecologic Exam    History of Present Illness: Patient is a 57 y.o. G4P0010 presents for annual exam. The patient has no complaints today. She is her for a pap, her provider was not able to collect one because her "cervix was too high up"  LMP: Patient's last menstrual period was 10/13/2014 (approximate).  The patient occasionally sexually active with 1 female. She currently uses post menopausal status for contraception. She has some dyspareunia.  The patient does not perform self breast exams.  There is no notable family history of breast or ovarian cancer in her family.  The patient wears seatbelts: yes.   The patient has regular exercise:  walking at a time .    The patient reports current symptoms of depression.  Tearful today, has always struggled with Depression/anxiety, taking Buspar, does not feel therapy would help, state she is "ok" she "cries a lot", has discussed this with her PCP.   -currently unemployed -Lives with her husband, feels safe, they do have a strained relationship -PCP Dedra Skeens -Dental up to date -Eyes; due for exam   Review of Systems: ROS  Past Medical History:  Patient Active Problem List   Diagnosis Date Noted Date Diagnosed   Right hip pain 03/12/2023    Acute bilateral low back pain without sciatica 03/12/2023    Chronic left hip pain 11/28/2022    NASH (nonalcoholic steatohepatitis) 05/02/2022     By biopsy in 2018 , with persistently elevated alk phos    History of learning disability as a child 10/25/2021    Anemia 04/06/2020    Onychomycosis of toenail 04/06/2020    Hospital discharge follow-up 01/17/2019    Coronary artery disease of native artery of native heart with stable angina pectoris (HCC)      S/p NSTEMI Apri 27 2020 S/o DES mid LAD 85 % stenosis resolved 100% occlusion RDPA 40% stenosis RCA  Normal LV  wall motion, LV function 55 to 60% by 2020 ECHO    Fatigue 10/22/2018    Serrated polyposis syndrome 01/26/2018     May 2019,  3 yr follow up advised     Positive colorectal cancer screening using Cologuard test 11/15/2017     Serrated adenomas  multiple  Over 1 cm.  3 yr follow up advise ([place])     Abnormal mammogram of right breast 11/13/2017     Diagnostic US and mammogram has been ordered     Encounter for general adult medical examination with abnormal findings 09/13/2017    Type 2 diabetes mellitus with obesity (HCC) 11/12/2016    Encounter for preventive health examination 09/27/2015    History of tobacco abuse 06/08/2015    Pain in joint, shoulder region 06/08/2015    Decreased vision in both eyes 05/28/2013    Irritable bowel syndrome 05/28/2013    Generalized anxiety disorder 05/10/2011    Cervical cancer screening 05/10/2011    Screening for breast cancer 05/10/2011     Past Surgical History:  Past Surgical History:  Procedure Laterality Date   CARDIAC CATHETERIZATION     COLONOSCOPY WITH PROPOFOL N/A 01/24/2018   Procedure: COLONOSCOPY WITH PROPOFOL;  Surgeon: Earline Mayotte, MD;  Location: [facility] ENDOSCOPY;  Service: Endoscopy;  Laterality: N/A;   COLONOSCOPY WITH PROPOFOL N/A 06/09/2021   Procedure: COLONOSCOPY WITH PROPOFOL;  Surgeon: Earline Mayotte, MD;  Location: [facility]  ENDOSCOPY;  Service: Endoscopy;  Laterality: N/A;   COLONOSCOPY WITH PROPOFOL N/A 07/20/2022   Procedure: COLONOSCOPY WITH PROPOFOL;  Surgeon: Earline Mayotte, MD;  Location: [facility] ENDOSCOPY;  Service: Endoscopy;  Laterality: N/A;   CORONARY STENT INTERVENTION N/A 01/07/2019   Procedure: CORONARY STENT INTERVENTION;  Surgeon: Iran Ouch, MD;  Location: [facility] INVASIVE CV LAB;  Service: Cardiovascular;  Laterality: N/A;  LAD   csection x2     DILATION AND CURETTAGE OF UTERUS     ESOPHAGOGASTRODUODENOSCOPY (EGD) WITH PROPOFOL N/A 06/09/2021   Procedure: ESOPHAGOGASTRODUODENOSCOPY  (EGD) WITH PROPOFOL;  Surgeon: Earline Mayotte, MD;  Location: [facility] ENDOSCOPY;  Service: Endoscopy;  Laterality: N/A;   LEFT HEART CATH AND CORONARY ANGIOGRAPHY N/A 01/07/2019   Procedure: LEFT HEART CATH AND CORONARY ANGIOGRAPHY;  Surgeon: Iran Ouch, MD;  Location: [facility] INVASIVE CV LAB;  Service: Cardiovascular;  Laterality: N/A;   LIVER BIOPSY  04/26/2017   TONSILLECTOMY      Gynecologic History:  Patient's last menstrual period was 10/13/2014 (approximate). Contraception: post menopausal status Last Pap: Results were: 2018 no abnormalities  Last mammogram: 2024 Results were: BI-RAD I  Obstetric History: G4P0010  Family History:  Family History  Problem Relation Age of Onset   Coronary artery disease Mother    Coronary artery disease Father        smoker   Breast cancer Neg Hx    Colon cancer Neg Hx     Social History:  Social History   Socioeconomic History   Marital status: Married    Spouse name: Not on file   Number of children: Not on file   Years of education: Not on file   Highest education level: Not on file  Occupational History   Not on file  Tobacco Use   Smoking status: Former    Current packs/day: 0.00    Average packs/day: 1 pack/day for 30.0 years (30.0 ttl pk-yrs)    Types: Cigarettes    Start date: 01/05/1989    Quit date: 01/06/2019    Years since quitting: 4.5   Smokeless tobacco: Never   Tobacco comments:    smokes one pack of cigarettes per day since age 66, quit during pregnancies.  Vaping Use   Vaping status: Never Used  Substance and Sexual Activity   Alcohol use: No    Alcohol/week: 0.0 standard drinks of alcohol   Drug use: No   Sexual activity: Yes    Birth control/protection: None  Other Topics Concern   Not on file  Social History Narrative   Lives with spouse and children.  Daughter in Social worker of Larene Beach and Casey Burkitt.   Quit high school in the 9th grade.  Was raised by grandmother due to parental conflicts.   Social  Determinants of Health   Financial Resource Strain: Low Risk  (04/27/2020)   Overall Financial Resource Strain (CARDIA)    Difficulty of Paying Living Expenses: Not hard at all  Food Insecurity: No Food Insecurity (04/27/2020)   Hunger Vital Sign    Worried About Running Out of Food in the Last Year: Never true    Ran Out of Food in the Last Year: Never true  Transportation Needs: No Transportation Needs (04/27/2020)   PRAPARE - Administrator, Civil Service (Medical): No    Lack of Transportation (Non-Medical): No  Physical Activity: Sufficiently Active (01/31/2019)   Exercise Vital Sign    Days of Exercise per Week: 5 days    Minutes of  Exercise per Session: 30 min  Stress: No Stress Concern Present (04/27/2020)   [employer] of Occupational Health - Occupational Stress Questionnaire    Feeling of Stress : Not at all  Social Connections: Unknown (04/27/2020)   Social Connection and Isolation Panel [NHANES]    Frequency of Communication with Friends and Family: Not on file    Frequency of Social Gatherings with Friends and Family: More than three times a week    Attends Religious Services: Not on file    Active Member of Clubs or Organizations: Not on file    Attends Banker Meetings: Not on file    Marital Status: Married  Intimate Partner Violence: Not At Risk (04/27/2020)   Humiliation, Afraid, Rape, and Kick questionnaire    Fear of Current or Ex-Partner: No    Emotionally Abused: No    Physically Abused: No    Sexually Abused: No    Allergies:  Allergies  Allergen Reactions   Codeine Itching    Medications: Prior to Admission medications   Medication Sig Start Date End Date Taking? Authorizing Provider  aspirin EC 81 MG EC tablet Take 1 tablet (81 mg total) by mouth daily. 01/08/19  Yes Wieting, Richard, MD  atorvastatin (LIPITOR) 80 MG tablet Take 1 tablet (80 mg total) by mouth daily. 01/17/23  Yes Iran Ouch, MD  busPIRone (BUSPAR) 30  MG tablet TAKE 1 TABLET BY MOUTH 3 TIMES DAILY 05/29/23  Yes Sherlene Shams, MD  omeprazole (PRILOSEC) 40 MG capsule TAKE 1 CAPSULE(40 MG) BY MOUTH DAILY 03/07/23  Yes Sherlene Shams, MD  venlafaxine XR (EFFEXOR-XR) 75 MG 24 hr capsule TAKE 1 CAPSULE BY MOUTH DAILY 05/29/23  Yes Sherlene Shams, MD  clindamycin (CLEOCIN) 150 MG capsule Take 150 mg by mouth 4 (four) times daily. Patient not taking: Reported on 07/04/2023 10/22/21   [provider]  Multiple Vitamins-Minerals (PRESERVISION AREDS 2+MULTI VIT) CAPS Take by mouth. Patient not taking: Reported on 07/17/2023    [provider]    Physical Exam Vitals: Blood pressure 128/77, pulse 99, height 5\' 3"  (1.6 m), weight 215 lb 4.8 oz (97.7 kg), last menstrual period 10/13/2014.  General: NAD HEENT: normocephalic, anicteric Thyroid: no enlargement, no palpable nodules Pulmonary: No increased work of breathing, CTAB Cardiovascular: RRR, distal pulses 2+ Breast: Breast symmetrical, no tenderness, no palpable nodules or masses, no skin or nipple retraction present, no nipple discharge.  No axillary or supraclavicular lymphadenopathy. Abdomen: NABS, soft, non-tender, non-distended.  Umbilicus without lesions.  No hepatomegaly, splenomegaly or masses palpable. No evidence of hernia  Genitourinary:  External: Normal external female genitalia, consistent with post menopausal state.  Normal urethral meatus, normal Bartholin's and Skene's glands.    Vagina: Normal vaginal mucosa, no evidence of prolapse.    Cervix: Grossly normal in appearance, no bleeding (cervix was difficult to visualize with longer spec and pt's legs pulled back, difficult to palpate on exam, cervix flush to lower uterine segment on palpation)  Uterus: Non-enlarged, mobile, normal contour.  No CMT  Adnexa: ovaries non-enlarged, no adnexal masses  Rectal: deferred  Lymphatic: no evidence of inguinal lymphadenopathy Extremities: no edema, erythema, or  tenderness Neurologic: Grossly intact Psychiatric: mood appropriate, affect full   Assessment: 57 y.o. G4P0010 routine annual exam  Plan: Problem List Items Addressed This Visit       Other   Cervical cancer screening   Relevant Orders   Cytology - PAP   Other Visit Diagnoses  Well woman exam    -  Primary   Relevant Orders   Cytology - PAP       1) Mammogram - recommend yearly screening mammogram.  Mammogram Is up to date   2) STI screening  wasoffered and declined  3) ASCCP guidelines and rational discussed.  Patient opts for every 5 years screening interval  4) Contraception - the patient is currently using  post menopausal status.    5) Colonoscopy last 2023 -- Screening recommended starting at age 24 for average risk individuals, age 56 for individuals deemed at increased risk (including African Americans) and recommended to continue until age 59.  For patient age 48-85 individualized approach is recommended.  Gold standard screening is via colonoscopy, Cologuard screening is an acceptable alternative for patient unwilling or unable to undergo colonoscopy.  "Colorectal cancer screening for average?risk adults: 2018 guideline update from the American Cancer Society"CA: A Cancer Journal for Clinicians: Feb 08, 2017   6) Routine healthcare maintenance including cholesterol, diabetes screening discussed managed by PCP  7) No follow-ups on file.  Carie Caddy, CNM  [facility] 07/17/2023, 10:58 AM

## 2023-07-18 ENCOUNTER — Encounter: Payer: Self-pay | Admitting: Internal Medicine

## 2023-07-18 DIAGNOSIS — R748 Abnormal levels of other serum enzymes: Secondary | ICD-10-CM | POA: Insufficient documentation

## 2023-07-18 NOTE — Assessment & Plan Note (Signed)
Minimal By biopsy in 2018 , with persistently elevated alk phos, normal f=AST/ALT,  normal alk phos fractionation ,  normal GGT by recent GI repeat evaluation .  Weight loss advised.

## 2023-07-18 NOTE — Assessment & Plan Note (Signed)
Repeat GI evaluation made with Dr Servando Snare.  Etiology remains  unclear.  She has had a eepeatedly normal  fractionation ,  a normal GGT, a   PTH 65 (ULN) , calcium has been repeatedly normal . Liver biopsy in 2018 noted minimal fatty liver. Continue to follow ,

## 2023-07-20 ENCOUNTER — Telehealth: Payer: Self-pay

## 2023-07-20 LAB — CYTOLOGY - PAP
Adequacy: ABSENT
Comment: NEGATIVE
Diagnosis: NEGATIVE
High risk HPV: NEGATIVE

## 2023-07-20 NOTE — Telephone Encounter (Signed)
LMTCB in regards to lab results.  

## 2023-07-20 NOTE — Telephone Encounter (Signed)
Patient returned office phone call and note was read. 

## 2023-07-20 NOTE — Telephone Encounter (Signed)
Noted  

## 2023-07-20 NOTE — Telephone Encounter (Signed)
-----   Message from Sherlene Shams sent at 07/18/2023 12:38 PM EST ----- Your diabetes remains under excellent control  And your cholesterol and other labs are also normal. Please continue your current medications. return in 6 months for follow up on diabetes and make sure you are seeing your eye doctor at least once a year for a dilated retina exam to monitor for diabetic retinopathy,. changes that can lead to blindness .   Regards,   Duncan Dull, MD

## 2023-07-26 ENCOUNTER — Telehealth: Payer: Self-pay | Admitting: Licensed Practical Nurse

## 2023-07-26 NOTE — Telephone Encounter (Signed)
TC to [employer] Your pap was normal. You will need to repeat the pap in 5 years. Going forward you can decided how much of a pelvic exam you would like at your annual exam.  Kayla Farmer verbalized understanding [name]   [facility]  07/26/2023 2:17 PM

## 2023-08-21 DIAGNOSIS — E119 Type 2 diabetes mellitus without complications: Secondary | ICD-10-CM | POA: Diagnosis not present

## 2023-08-21 LAB — HM DIABETES EYE EXAM

## 2023-12-04 ENCOUNTER — Other Ambulatory Visit: Payer: Self-pay | Admitting: Internal Medicine

## 2023-12-04 ENCOUNTER — Other Ambulatory Visit: Payer: Self-pay | Admitting: Cardiovascular Disease

## 2023-12-04 DIAGNOSIS — I25118 Atherosclerotic heart disease of native coronary artery with other forms of angina pectoris: Secondary | ICD-10-CM

## 2023-12-04 DIAGNOSIS — E785 Hyperlipidemia, unspecified: Secondary | ICD-10-CM

## 2024-01-02 ENCOUNTER — Ambulatory Visit: Payer: Medicare Other | Admitting: Internal Medicine

## 2024-01-15 ENCOUNTER — Other Ambulatory Visit: Payer: Self-pay | Admitting: Internal Medicine

## 2024-03-05 ENCOUNTER — Encounter: Payer: Self-pay | Admitting: Internal Medicine

## 2024-03-05 ENCOUNTER — Ambulatory Visit (INDEPENDENT_AMBULATORY_CARE_PROVIDER_SITE_OTHER): Admitting: Internal Medicine

## 2024-03-05 VITALS — BP 122/84 | HR 88 | Ht 63.0 in | Wt 214.8 lb

## 2024-03-05 DIAGNOSIS — K7581 Nonalcoholic steatohepatitis (NASH): Secondary | ICD-10-CM

## 2024-03-05 DIAGNOSIS — D126 Benign neoplasm of colon, unspecified: Secondary | ICD-10-CM

## 2024-03-05 DIAGNOSIS — E1169 Type 2 diabetes mellitus with other specified complication: Secondary | ICD-10-CM | POA: Diagnosis not present

## 2024-03-05 DIAGNOSIS — D509 Iron deficiency anemia, unspecified: Secondary | ICD-10-CM | POA: Diagnosis not present

## 2024-03-05 DIAGNOSIS — I25118 Atherosclerotic heart disease of native coronary artery with other forms of angina pectoris: Secondary | ICD-10-CM | POA: Diagnosis not present

## 2024-03-05 DIAGNOSIS — E669 Obesity, unspecified: Secondary | ICD-10-CM | POA: Diagnosis not present

## 2024-03-05 DIAGNOSIS — E785 Hyperlipidemia, unspecified: Secondary | ICD-10-CM | POA: Diagnosis not present

## 2024-03-05 DIAGNOSIS — F411 Generalized anxiety disorder: Secondary | ICD-10-CM

## 2024-03-05 DIAGNOSIS — Z87891 Personal history of nicotine dependence: Secondary | ICD-10-CM

## 2024-03-05 DIAGNOSIS — Z1231 Encounter for screening mammogram for malignant neoplasm of breast: Secondary | ICD-10-CM | POA: Diagnosis not present

## 2024-03-05 LAB — CBC WITH DIFFERENTIAL/PLATELET
Basophils Absolute: 0.1 10*3/uL (ref 0.0–0.1)
Basophils Relative: 0.8 % (ref 0.0–3.0)
Eosinophils Absolute: 0.2 10*3/uL (ref 0.0–0.7)
Eosinophils Relative: 2.2 % (ref 0.0–5.0)
HCT: 36.4 % (ref 36.0–46.0)
Hemoglobin: 11.7 g/dL — ABNORMAL LOW (ref 12.0–15.0)
Lymphocytes Relative: 29.2 % (ref 12.0–46.0)
Lymphs Abs: 2.3 10*3/uL (ref 0.7–4.0)
MCHC: 32.3 g/dL (ref 30.0–36.0)
MCV: 78.3 fl (ref 78.0–100.0)
Monocytes Absolute: 0.4 10*3/uL (ref 0.1–1.0)
Monocytes Relative: 5.5 % (ref 3.0–12.0)
Neutro Abs: 5 10*3/uL (ref 1.4–7.7)
Neutrophils Relative %: 62.3 % (ref 43.0–77.0)
Platelets: 260 10*3/uL (ref 150.0–400.0)
RBC: 4.64 Mil/uL (ref 3.87–5.11)
RDW: 16 % — ABNORMAL HIGH (ref 11.5–15.5)
WBC: 8 10*3/uL (ref 4.0–10.5)

## 2024-03-05 LAB — LIPID PANEL
Cholesterol: 153 mg/dL (ref 0–200)
HDL: 44.8 mg/dL (ref >39.00)
LDL Cholesterol: 76 mg/dL (ref 0–99)
NonHDL: 107.76
Total CHOL/HDL Ratio: 3
Triglycerides: 160 mg/dL — ABNORMAL HIGH (ref 0.0–149.0)
VLDL: 32 mg/dL (ref 0.0–40.0)

## 2024-03-05 LAB — COMPREHENSIVE METABOLIC PANEL WITH GFR
ALT: 21 U/L (ref 0–35)
AST: 21 U/L (ref 0–37)
Albumin: 4.3 g/dL (ref 3.5–5.2)
Alkaline Phosphatase: 187 U/L — ABNORMAL HIGH (ref 39–117)
BUN: 19 mg/dL (ref 6–23)
CO2: 31 meq/L (ref 19–32)
Calcium: 9.4 mg/dL (ref 8.4–10.5)
Chloride: 101 meq/L (ref 96–112)
Creatinine, Ser: 0.9 mg/dL (ref 0.40–1.20)
GFR: 69.64 mL/min (ref >60.00)
Glucose, Bld: 102 mg/dL — ABNORMAL HIGH (ref 70–99)
Potassium: 4.3 meq/L (ref 3.5–5.1)
Sodium: 138 meq/L (ref 135–145)
Total Bilirubin: 0.4 mg/dL (ref 0.2–1.2)
Total Protein: 7.7 g/dL (ref 6.0–8.3)

## 2024-03-05 LAB — HEMOGLOBIN A1C: Hgb A1c MFr Bld: 6.6 % — ABNORMAL HIGH (ref 4.6–6.5)

## 2024-03-05 LAB — MICROALBUMIN / CREATININE URINE RATIO
Creatinine,U: 108.6 mg/dL
Microalb Creat Ratio: 10.9 mg/g (ref 0.0–30.0)
Microalb, Ur: 1.2 mg/dL (ref 0.0–1.9)

## 2024-03-05 LAB — LDL CHOLESTEROL, DIRECT: Direct LDL: 82 mg/dL

## 2024-03-05 NOTE — Assessment & Plan Note (Signed)
 She is asymptomatic and remains tobacco free.  She continues to decline trial of Jardiance despite discussion of the benefits,  She is taking metoprolol , statin and ASA   Lab Results  Component Value Date   CHOL 140 07/17/2023   HDL 46.90 07/17/2023   LDLCALC 67 07/17/2023   LDLDIRECT 77.0 07/17/2023   TRIG 131.0 07/17/2023   CHOLHDL 3 07/17/2023

## 2024-03-05 NOTE — Assessment & Plan Note (Signed)
 Weight loss advised.  Ozempic trial offered but deferred.

## 2024-03-05 NOTE — Progress Notes (Signed)
 Subjective:  Patient ID: Kayla Farmer, female    DOB: 1966/03/16  Age: 58 y.o. MRN: [id number]  CC: The primary encounter diagnosis was Encounter for screening mammogram for malignant neoplasm of breast. Diagnoses of Type 2 diabetes mellitus with obesity (HCC), Hyperlipidemia LDL goal <70, Iron  deficiency anemia, unspecified iron  deficiency anemia type, NASH (nonalcoholic steatohepatitis), History of tobacco abuse, Coronary artery disease of native artery of native heart with stable angina pectoris (HCC), Serrated polyposis syndrome, Morbid obesity (HCC), and Generalized anxiety disorder were also pertinent to this visit.   HPI Kayla Farmer presents for  Chief Complaint  Patient presents with   Medical Management of Chronic Issues    6 month follow up    1) Obesity/Type 2 DM:  not exercising .  Denies physical issues,  has been distracted by problems of her aging parents and recent hospitalization at [facility] .  Discussed use of GLP 1 agonist but she is not interested bc of potential side effects.    2) Depression/anxiety:  she is tearful today , worried about her oldest son Penne because he is a Public relations account executive homosexual and doesn't practice Christianity.  Her daughter's boyfriend is just like her father.'   Husband Luke has stopped taking medications,  and lost weight , and has a lot of political rants that aggravate her . Brings up her foster home upbringing,    3) foot pai left 5th [place] head . No trauma     Outpatient Medications Prior to Visit  Medication Sig Dispense Refill   aspirin  EC 81 MG EC tablet Take 1 tablet (81 mg total) by mouth daily. 30 tablet 0   atorvastatin  (LIPITOR ) 80 MG tablet TAKE 1 TABLET BY MOUTH DAILY AT 6PM 90 tablet 3   busPIRone  (BUSPAR ) 30 MG tablet TAKE 1 TABLET BY MOUTH 3 TIMES DAILY 90 tablet 1   Multiple Vitamins-Minerals (PRESERVISION AREDS 2+MULTI VIT) CAPS Take by mouth.     omeprazole  (PRILOSEC) 40 MG capsule TAKE 1 CAPSULE BY MOUTH DAILY 90 capsule 1    venlafaxine  XR (EFFEXOR -XR) 75 MG 24 hr capsule TAKE 1 CAPSULE BY MOUTH DAILY 90 capsule 1   clindamycin (CLEOCIN) 150 MG capsule Take 150 mg by mouth 4 (four) times daily. (Patient not taking: Reported on 07/04/2023)     No facility-administered medications prior to visit.    Review of Systems;  Patient denies headache, fevers, malaise, unintentional weight loss, skin rash, eye pain, sinus congestion and sinus pain, sore throat, dysphagia,  hemoptysis , cough, dyspnea, wheezing, chest pain, palpitations, orthopnea, edema, abdominal pain, nausea, melena, diarrhea, constipation, flank pain, dysuria, hematuria, urinary  Frequency, nocturia, numbness, tingling, seizures,  Focal weakness, Loss of consciousness,  Tremor, insomnia, depression, anxiety, and suicidal ideation.      Objective:  BP 122/84   Pulse 88   Ht 5' 3 (1.6 m)   Wt 214 lb 12.8 oz (97.4 kg)   LMP 10/13/2014 (Approximate)   SpO2 98%   BMI 38.05 kg/m   BP Readings from Last 3 Encounters:  03/05/24 122/84  07/17/23 128/77  07/04/23 116/70    Wt Readings from Last 3 Encounters:  03/05/24 214 lb 12.8 oz (97.4 kg)  07/17/23 215 lb 4.8 oz (97.7 kg)  07/04/23 211 lb (95.7 kg)    Physical Exam Vitals reviewed.  Constitutional:      General: She is not in acute distress.    Appearance: Normal appearance. She is obese. She is not ill-appearing, toxic-appearing or diaphoretic.  HENT:     Head: Normocephalic.   Eyes:     General: No scleral icterus.       Right eye: No discharge.        Left eye: No discharge.     Conjunctiva/sclera: Conjunctivae normal.    Cardiovascular:     Rate and Rhythm: Normal rate and regular rhythm.     Heart sounds: Normal heart sounds.  Pulmonary:     Effort: Pulmonary effort is normal. No respiratory distress.     Breath sounds: Normal breath sounds.   Musculoskeletal:        General: Normal range of motion.   Skin:    General: Skin is warm and dry.   Neurological:      General: No focal deficit present.     Mental Status: She is alert and oriented to person, place, and time. Mental status is at baseline.   Psychiatric:        Mood and Affect: Mood normal. Affect is tearful.        Speech: Speech normal.        Behavior: Behavior is cooperative.        Thought Content: Thought content normal.        Judgment: Judgment normal.     Lab Results  Component Value Date   HGBA1C 6.5 07/17/2023   HGBA1C 6.4 11/28/2022   HGBA1C 6.5 05/02/2022    Lab Results  Component Value Date   CREATININE 0.85 07/17/2023   CREATININE 0.92 03/07/2023   CREATININE 0.88 11/28/2022    Lab Results  Component Value Date   WBC 8.4 03/07/2023   HGB 11.8 (L) 03/07/2023   HCT 37.5 03/07/2023   PLT 262.0 03/07/2023   GLUCOSE 97 07/17/2023   CHOL 140 07/17/2023   TRIG 131.0 07/17/2023   HDL 46.90 07/17/2023   LDLDIRECT 77.0 07/17/2023   LDLCALC 67 07/17/2023   ALT 19 07/17/2023   AST 20 07/17/2023   NA 140 07/17/2023   K 4.1 07/17/2023   CL 104 07/17/2023   CREATININE 0.85 07/17/2023   BUN 14 07/17/2023   CO2 27 07/17/2023   TSH 2.73 07/17/2023   INR 1.0 01/07/2019   HGBA1C 6.5 07/17/2023   MICROALBUR 1.0 11/28/2022    No results found.  Assessment & Plan:  .Encounter for screening mammogram for malignant neoplasm of breast -     3D Screening Mammogram, Left and Right; Future  Type 2 diabetes mellitus with obesity (HCC) Assessment & Plan: Managed with diet per patient preference. In spite of fatty liver and CAD, she refuses metforminozempic  and Jardiance .despite review of the metabolic and renal benefits.  She has declined nutritional diabetes education . Continue statin, ASA  and ARB  for secondary prevention of CAD.  She declines all vaccinations against streptococcal  pneumonia despite my recommendations  Lab Results  Component Value Date   HGBA1C 6.5 07/17/2023   Lab Results  Component Value Date   MICROALBUR 1.0 11/28/2022   MICROALBUR 1.3  05/02/2022        Orders: -     Hemoglobin A1c -     Comprehensive metabolic panel with GFR -     Microalbumin / creatinine urine ratio  Hyperlipidemia LDL goal <70 -     Lipid panel -     LDL cholesterol, direct  Iron  deficiency anemia, unspecified iron  deficiency anemia type -     CBC with Differential/Platelet  NASH (nonalcoholic steatohepatitis) Assessment & Plan: Weight loss advised.  Ozempic trial offered but deferred.    History of tobacco abuse Assessment & Plan: She has remained abstinent  for 4 years   Coronary artery disease of native artery of native heart with stable angina pectoris [facility]) Assessment & Plan: She is asymptomatic and remains tobacco free.  She continues to decline trial of Jardiance despite discussion of the benefits,  She is taking metoprolol , statin and ASA   Lab Results  Component Value Date   CHOL 140 07/17/2023   HDL 46.90 07/17/2023   LDLCALC 67 07/17/2023   LDLDIRECT 77.0 07/17/2023   TRIG 131.0 07/17/2023   CHOLHDL 3 07/17/2023      Serrated polyposis syndrome Assessment & Plan: Suggested by 2022 colonoscopy.   2023  colonoscopy yielded 4 hyperplastic polyps    Morbid obesity (HCC) Assessment & Plan: Body mass index is 38.05 kg/m.  And type @DM  with MASH.   have addressed  BMI and recommended wt loss of 10% of body weight over the next 6 months using a low glycemic index diet and regular participation in aerobic exercise a minimum of 30 minutes, a  minimum of 5 days per week. She declines offer  of GLP 1 agonist/     Generalized anxiety disorder Assessment & Plan: Chronic,  Acquired in adolescence and aggravated by a feeling of inferiority because of her lack of education.  She worries aboyt her 3 grown children and  continues to harbor   a basic mistrust of the medical establishment and prescribed medications.  Advised to continue medications.    She declines change in  treatment and referral to psychiatry . Continue  buspirone  and effexor .       I spent 34 minutes on the day of this face to face encounter reviewing patient's  most recent visit with cardiology,  n  prior relevant surgical and non surgical procedures, recent  labs and imaging studies, counseling on weight and diabetes management,  reviewing the assessment and plan with patient, and post visit ordering and reviewing of  diagnostics and therapeutics with patient  .   Follow-up: No follow-ups on file.   Verneita LITTIE Kettering, MD

## 2024-03-05 NOTE — Assessment & Plan Note (Addendum)
 Suggested by 2022 colonoscopy.   2023  colonoscopy yielded 4 hyperplastic polyps

## 2024-03-05 NOTE — Patient Instructions (Signed)
 I am recommending 2 medications that you have deferred for now  1) Ozempic or Mounjaro to help you lose weight and manage diabetes  2) Doreen or Jardiance to manage diabetes and reduce your risk of future heart attacks

## 2024-03-05 NOTE — Assessment & Plan Note (Signed)
 Managed with diet per patient preference. In spite of fatty liver and CAD, she refuses metforminozempic  and Jardiance .despite review of the metabolic and renal benefits.  She has declined nutritional diabetes education . Continue statin, ASA  and ARB  for secondary prevention of CAD.  She declines all vaccinations against streptococcal  pneumonia despite my recommendations  Lab Results  Component Value Date   HGBA1C 6.5 07/17/2023   Lab Results  Component Value Date   MICROALBUR 1.0 11/28/2022   MICROALBUR 1.3 05/02/2022

## 2024-03-05 NOTE — Assessment & Plan Note (Signed)
 Body mass index is 38.05 kg/m.  And type @DM  with MASH.   have addressed  BMI and recommended wt loss of 10% of body weight over the next 6 months using a low glycemic index diet and regular participation in aerobic exercise a minimum of 30 minutes, a  minimum of 5 days per week. She declines offer  of GLP 1 agonist/

## 2024-03-05 NOTE — Assessment & Plan Note (Signed)
 She has remained abstinent  for 4 years

## 2024-03-05 NOTE — Assessment & Plan Note (Signed)
 Chronic,  Acquired in adolescence and aggravated by a feeling of inferiority because of her lack of education.  She worries aboyt her 3 grown children and  continues to harbor   a basic mistrust of the medical establishment and prescribed medications.  Advised to continue medications.    She declines change in  treatment and referral to psychiatry . Continue buspirone  and effexor .

## 2024-03-06 ENCOUNTER — Ambulatory Visit: Payer: Self-pay | Admitting: Internal Medicine

## 2024-03-06 DIAGNOSIS — I25118 Atherosclerotic heart disease of native coronary artery with other forms of angina pectoris: Secondary | ICD-10-CM

## 2024-03-06 DIAGNOSIS — E785 Hyperlipidemia, unspecified: Secondary | ICD-10-CM

## 2024-03-06 MED ORDER — ROSUVASTATIN CALCIUM 40 MG PO TABS: 40.0000 mg | ORAL_TABLET | Freq: Every day | ORAL | 0 refills | Status: DC

## 2024-03-06 MED ORDER — ATORVASTATIN CALCIUM 80 MG PO TABS
80.0000 mg | ORAL_TABLET | Freq: Every day | ORAL | 3 refills | Status: AC
Start: 2024-03-06 — End: ?

## 2024-03-06 NOTE — Assessment & Plan Note (Signed)
 Patient has declined the statin change recommended.  Continue atorvastatin 

## 2024-03-06 NOTE — Addendum Note (Signed)
 Addended by: MARYLYNN VERNEITA CROME on: 03/06/2024 09:38 AM   Modules accepted: Orders

## 2024-03-06 NOTE — Assessment & Plan Note (Addendum)
 LDL is ont at goal on maximum dose of Atorvasttatin.  She is unwilling to add medications.  Will change to max dose of rosuvastatin.   Lab Results  Component Value Date   CHOL 153 03/05/2024   HDL 44.80 03/05/2024   LDLCALC 76 03/05/2024   LDLDIRECT 82.0 03/05/2024   TRIG 160.0 (H) 03/05/2024   CHOLHDL 3 03/05/2024

## 2024-04-25 ENCOUNTER — Telehealth: Payer: Self-pay

## 2024-04-25 DIAGNOSIS — H1033 Unspecified acute conjunctivitis, bilateral: Secondary | ICD-10-CM | POA: Diagnosis not present

## 2024-04-25 NOTE — Telephone Encounter (Signed)
 Pt scheduled for CPE in Dec 2025

## 2024-05-03 ENCOUNTER — Ambulatory Visit: Admitting: Podiatry

## 2024-05-30 ENCOUNTER — Other Ambulatory Visit: Payer: Self-pay | Admitting: Internal Medicine

## 2024-07-02 DIAGNOSIS — D239 Other benign neoplasm of skin, unspecified: Secondary | ICD-10-CM | POA: Diagnosis not present

## 2024-07-02 DIAGNOSIS — D485 Neoplasm of uncertain behavior of skin: Secondary | ICD-10-CM | POA: Diagnosis not present

## 2024-07-02 DIAGNOSIS — L570 Actinic keratosis: Secondary | ICD-10-CM | POA: Diagnosis not present

## 2024-07-05 ENCOUNTER — Other Ambulatory Visit: Payer: Self-pay | Admitting: Internal Medicine

## 2024-08-26 ENCOUNTER — Encounter: Admitting: Internal Medicine

## 2024-08-28 ENCOUNTER — Ambulatory Visit: Admitting: Cardiovascular Disease

## 2024-09-19 ENCOUNTER — Ambulatory Visit: Admitting: Podiatry

## 2024-09-19 DIAGNOSIS — Q666 Other congenital valgus deformities of feet: Secondary | ICD-10-CM

## 2024-09-19 DIAGNOSIS — M722 Plantar fascial fibromatosis: Secondary | ICD-10-CM | POA: Diagnosis not present

## 2024-09-19 NOTE — Progress Notes (Unsigned)
 Right PF injection brace  Pes planovalgus orhotics

## 2024-09-30 ENCOUNTER — Ambulatory Visit: Admitting: *Deleted

## 2024-09-30 VITALS — Ht 63.0 in | Wt 214.0 lb

## 2024-09-30 DIAGNOSIS — Z0000 Encounter for general adult medical examination without abnormal findings: Secondary | ICD-10-CM | POA: Diagnosis not present

## 2024-09-30 NOTE — Progress Notes (Signed)
 "  Chief Complaint  Patient presents with   Medicare Wellness     Subjective:   BABBIE DONDLINGER is a 59 y.o. female who presents for a Medicare Annual Wellness Visit.  Visit info / Clinical Intake: Medicare Wellness Visit Type:: Subsequent Annual Wellness Visit Persons participating in visit and providing information:: patient Medicare Wellness Visit Mode:: Telephone If telephone:: video declined Since this visit was completed virtually, some vitals may be partially provided or unavailable. Missing vitals are due to the limitations of the virtual format.: Unable to obtain vitals - no equipment If Telephone or Video please confirm:: I connected with patient using audio/video enable telemedicine. I verified patient identity with two identifiers, discussed telehealth limitations, and patient agreed to proceed. Patient Location:: Home Provider Location:: Office/home Interpreter Needed?: No Pre-visit prep was completed: yes AWV questionnaire completed by patient prior to visit?: no Living arrangements:: with family/others Patient's Overall Health Status Rating: (!) fair Typical amount of pain: none Does pain affect daily life?: no Are you currently prescribed opioids?: no  Dietary Habits and Nutritional Risks How many meals a day?: 2 Eats fruit and vegetables daily?: (!) no Most meals are obtained by: eating out In the last 2 weeks, have you had any of the following?: none Diabetic:: (!) yes Any non-healing wounds?: no How often do you check your BS?: 0 Would you like to be referred to a Nutritionist or for Diabetic Management? : no  Functional Status Activities of Daily Living (to include ambulation/medication): Independent Ambulation: Independent Medication Administration: Independent Home Management (perform basic housework or laundry): Independent Manage your own finances?: yes Primary transportation is: driving Concerns about vision?: no *vision screening is required for  WTM* Concerns about hearing?: no  Fall Screening Falls in the past year?: 0 Number of falls in past year: 0 Was there an injury with Fall?: 0 Fall Risk Category Calculator: 0 Patient Fall Risk Level: Low Fall Risk  Fall Risk Patient at Risk for Falls Due to: No Fall Risks Fall risk Follow up: Falls evaluation completed; Falls prevention discussed  Home and Transportation Safety: All rugs have non-skid backing?: N/A, no rugs All stairs or steps have railings?: N/A, no stairs Grab bars in the bathtub or shower?: (!) no Have non-skid surface in bathtub or shower?: (!) no Good home lighting?: yes Regular seat belt use?: yes Hospital stays in the last year:: no  Cognitive Assessment Difficulty concentrating, remembering, or making decisions? : no Will 6CIT or Mini Cog be Completed: yes What year is it?: 0 points What month is it?: 0 points Give patient an address phrase to remember (5 components): [address] [place] About what time is it?: 0 points Count backwards from 20 to 1: 0 points Say the months of the year in reverse: 0 points Repeat the address phrase from earlier: 2 points 6 CIT Score: 2 points  Advance Directives (For Healthcare) Does Patient Have a Medical Advance Directive?: No Would patient like information on creating a medical advance directive?: No - Patient declined  Reviewed/Updated  Reviewed/Updated: Reviewed All (Medical, Surgical, Family, Medications, Allergies, Care Teams, Patient Goals)    Allergies (verified) Codeine   Current Medications (verified) Outpatient Encounter Medications as of 09/30/2024  Medication Sig   aspirin  EC 81 MG EC tablet Take 1 tablet (81 mg total) by mouth daily.   atorvastatin  (LIPITOR ) 80 MG tablet Take 1 tablet (80 mg total) by mouth daily.   busPIRone  (BUSPAR ) 30 MG tablet TAKE 1 TABLET BY MOUTH 3  TIMES DAILY   Multiple Vitamins-Minerals (PRESERVISION AREDS 2+MULTI VIT) CAPS Take by mouth.   omeprazole   (PRILOSEC) 40 MG capsule TAKE 1 CAPSULE BY MOUTH DAILY   venlafaxine  XR (EFFEXOR -XR) 75 MG 24 hr capsule TAKE 1 CAPSULE BY MOUTH DAILY   No facility-administered encounter medications on file as of 09/30/2024.    History: Past Medical History:  Diagnosis Date   Anxiety    Arthritis    CAD (coronary artery disease)    a. 12/2018 NSTEMI/PCI: LM nl, LAD 30p, 10m (2.25x22 Resolute Onyx DES), LCX nl, OM2 nl, RCA 31m, RPDA 100 w/ L->R collats (likely culprit - med rx). EF 50-55%.   Claudication    a. 07/2019 - L>R.   Depression    GERD (gastroesophageal reflux disease)    History of cervical cancer    History of tobacco abuse    a. 30 pack year - quit 12/2018 @ time of NSTEMI.   HLD (hyperlipidemia)    Myocardial infarction (HCC)    Obesity    Other inflammations of eyelids    Positive colorectal cancer screening using Cologuard test 2019   Positive colorectal cancer screening using Cologuard test 11/15/2017   Serrated adenomas  multiple  Over 1 cm.  3 yr follow up advise ([place])      Pre-diabetes    Sinus infection    Past Surgical History:  Procedure Laterality Date   CARDIAC CATHETERIZATION     COLONOSCOPY WITH PROPOFOL  N/A 01/24/2018   Procedure: COLONOSCOPY WITH PROPOFOL ;  Surgeon: Dessa Reyes ORN, MD;  Location: [facility] ENDOSCOPY;  Service: Endoscopy;  Laterality: N/A;   COLONOSCOPY WITH PROPOFOL  N/A 06/09/2021   Procedure: COLONOSCOPY WITH PROPOFOL ;  Surgeon: Dessa Reyes ORN, MD;  Location: [facility] ENDOSCOPY;  Service: Endoscopy;  Laterality: N/A;   COLONOSCOPY WITH PROPOFOL  N/A 07/20/2022   Procedure: COLONOSCOPY WITH PROPOFOL ;  Surgeon: Dessa Reyes ORN, MD;  Location: [facility] ENDOSCOPY;  Service: Endoscopy;  Laterality: N/A;   CORONARY STENT INTERVENTION N/A 01/07/2019   Procedure: CORONARY STENT INTERVENTION;  Surgeon: Darron Deatrice LABOR, MD;  Location: [facility] INVASIVE CV LAB;  Service: Cardiovascular;  Laterality: N/A;  LAD   csection x2     DILATION AND CURETTAGE OF UTERUS      ESOPHAGOGASTRODUODENOSCOPY (EGD) WITH PROPOFOL  N/A 06/09/2021   Procedure: ESOPHAGOGASTRODUODENOSCOPY (EGD) WITH PROPOFOL ;  Surgeon: Dessa Reyes ORN, MD;  Location: [facility] ENDOSCOPY;  Service: Endoscopy;  Laterality: N/A;   LEFT HEART CATH AND CORONARY ANGIOGRAPHY N/A 01/07/2019   Procedure: LEFT HEART CATH AND CORONARY ANGIOGRAPHY;  Surgeon: Darron Deatrice LABOR, MD;  Location: [facility] INVASIVE CV LAB;  Service: Cardiovascular;  Laterality: N/A;   LIVER BIOPSY  04/26/2017   TONSILLECTOMY     Family History  Problem Relation Age of Onset   Coronary artery disease Mother    Coronary artery disease Father        smoker   Breast cancer Neg Hx    Colon cancer Neg Hx    Social History   Occupational History   Not on file  Tobacco Use   Smoking status: Former    Current packs/day: 0.00    Average packs/day: 1 pack/day for 30.0 years (30.0 ttl pk-yrs)    Types: Cigarettes    Start date: 01/05/1989    Quit date: 01/06/2019    Years since quitting: 5.7   Smokeless tobacco: Never   Tobacco comments:    smokes one pack of cigarettes per day since age 35, quit during pregnancies.  Vaping Use  Vaping status: Never Used  Substance and Sexual Activity   Alcohol use: No    Alcohol/week: 0.0 standard drinks of alcohol   Drug use: No   Sexual activity: Yes    Birth control/protection: None   Tobacco Counseling Counseling given: Not Answered Tobacco comments: smokes one pack of cigarettes per day since age 66, quit during pregnancies.  SDOH Screenings   Food Insecurity: No Food Insecurity (09/30/2024)  Housing: Low Risk (09/30/2024)  Transportation Needs: No Transportation Needs (09/30/2024)  Utilities: Not At Risk (09/30/2024)  Alcohol Screen: Low Risk (09/30/2024)  Depression (PHQ2-9): Low Risk (09/30/2024)  Financial Resource Strain: Low Risk (09/30/2024)  Physical Activity: Insufficiently Active (09/30/2024)  Social Connections: Moderately Isolated (09/30/2024)  Stress: No Stress Concern  Present (09/30/2024)  Tobacco Use: Medium Risk (09/30/2024)  Health Literacy: Adequate Health Literacy (09/30/2024)   See flowsheets for full screening details  Depression Screen PHQ 2 & 9 Depression Scale- Over the past 2 weeks, how often have you been bothered by any of the following problems? Little interest or pleasure in doing things: 0 Feeling down, depressed, or hopeless (PHQ Adolescent also includes...irritable): 1 PHQ-2 Total Score: 1 Trouble falling or staying asleep, or sleeping too much: 0 Feeling tired or having little energy: 1 Poor appetite or overeating (PHQ Adolescent also includes...weight loss): 0 Feeling bad about yourself - or that you are a failure or have let yourself or your family down: 0 Trouble concentrating on things, such as reading the newspaper or watching television (PHQ Adolescent also includes...like school work): 0 Moving or speaking so slowly that other people could have noticed. Or the opposite - being so fidgety or restless that you have been moving around a lot more than usual: 0 Thoughts that you would be better off dead, or of hurting yourself in some way: 0 PHQ-9 Total Score: 2 If you checked off any problems, how difficult have these problems made it for you to do your work, take care of things at home, or get along with other people?: Not difficult at all     Goals Addressed             This Visit's Progress    Patient Stated       Wants to work on her weight             Objective:    Today's Vitals   09/30/24 1028  Weight: 214 lb (97.1 kg)  Height: 5' 3 (1.6 m)   Body mass index is 37.91 kg/m.  Hearing/Vision screen Hearing Screening - Comments:: No issues Vision Screening - Comments:: Read[facility], [facility], overdue,  Immunizations and Health Maintenance Health Maintenance  Topic Date Due   Pneumococcal Vaccine: 50+ Years (1 of 2 - PCV) Never done   Lung Cancer Screening  Never done   Zoster Vaccines- Shingrix (1 of 2)  Never done   DTaP/Tdap/Td (2 - Td or Tdap) 05/29/2023   Colonoscopy  07/23/2023   Mammogram  02/21/2024   Influenza Vaccine  Never done   OPHTHALMOLOGY EXAM  08/20/2024   HEMOGLOBIN A1C  09/04/2024   Diabetic kidney evaluation - eGFR measurement  03/05/2025   Diabetic kidney evaluation - Urine ACR  03/05/2025   FOOT EXAM  03/05/2025   Medicare Annual Wellness (AWV)  09/30/2025   Cervical Cancer Screening (HPV/Pap Cotest)  07/16/2028   HPV VACCINES (No Doses Required) Completed   Hepatitis C Screening  Completed   HIV Screening  Completed   Hepatitis B Vaccines 19-59  Average Risk  Aged Out   Meningococcal B Vaccine  Aged Out   COVID-19 Vaccine  Discontinued        Assessment/Plan:  This is a routine wellness examination for [facility].  Patient Care Team: Marylynn Verneita CROME, MD as PCP - General (Internal Medicine) Darron Deatrice LABOR, MD as PCP - Cardiology (Cardiology) Marylynn Verneita CROME, MD (Internal Medicine) Dominic, Jinnie Jansky, CNM as Midwife (Obstetrics and Gynecology) Jinny Carmine, MD as Consulting Physician (Gastroenterology) Leonce Longs, OD [facility])  I have personally reviewed and noted the following in the patients chart:   Medical and social history Use of alcohol, tobacco or illicit drugs  Current medications and supplements including opioid prescriptions. Functional ability and status Nutritional status Physical activity Advanced directives List of other physicians Hospitalizations, surgeries, and ER visits in previous 12 months Vitals Screenings to include cognitive, depression, and falls Referrals and appointments  No orders of the defined types were placed in this encounter.  In addition, I have reviewed and discussed with patient certain preventive protocols, quality metrics, and best practice recommendations. A written personalized care plan for preventive services as well as general preventive health recommendations were provided to patient.   Angeline Fredericks, LPN   8/80/7973   Return in 1 year (on 09/30/2025).  After Visit Summary: (Pick Up) Due to this being a telephonic visit, with patients personalized plan was offered to patient and patient has requested to Pick up at office.  Nurse Notes: Patient declines vaccines.  Patient stated she was told to repeat colonoscopy in 5 years. Patient stated that she will call and schedule mammogram.  Patient will discuss with PCP a lung cancer screening at upcoming appointment. Needs A1C at upcoming appointment.  "

## 2024-09-30 NOTE — Patient Instructions (Signed)
 Kayla Farmer,  Thank you for taking the time for your Medicare Wellness Visit. I appreciate your continued commitment to your health goals. Please review the care plan we discussed, and feel free to reach out if I can assist you further.  Please note that Annual Wellness Visits do not include a physical exam. Some assessments may be limited, especially if the visit was conducted virtually. If needed, we may recommend an in-person follow-up with your provider.  Ongoing Care Seeing your primary care provider every 3 to 6 months helps us  monitor your health and provide consistent, personalized care.  Remember to call the number provided and schedule your mammogram. Consider updating your vaccines.  Referrals If a referral was made during today's visit and you haven't received any updates within two weeks, please contact the referred provider directly to check on the status.  Recommended Screenings:  Health Maintenance  Topic Date Due   Pneumococcal Vaccine for age over 70 (1 of 2 - PCV) Never done   Screening for Lung Cancer  Never done   Zoster (Shingles) Vaccine (1 of 2) Never done   DTaP/Tdap/Td vaccine (2 - Td or Tdap) 05/29/2023   Colon Cancer Screening  07/23/2023   Breast Cancer Screening  02/21/2024   Flu Shot  Never done   Eye exam for diabetics  08/20/2024   Hemoglobin A1C  09/04/2024   Yearly kidney function blood test for diabetes  03/05/2025   Kidney health urinalysis for diabetes  03/05/2025   Complete foot exam   03/05/2025   Medicare Annual Wellness Visit  09/30/2025   Pap with HPV screening  07/16/2028   HPV Vaccine (No Doses Required) Completed   Hepatitis C Screening  Completed   HIV Screening  Completed   Hepatitis B Vaccine  Aged Out   Meningitis B Vaccine  Aged Out   COVID-19 Vaccine  Discontinued       09/30/2024   10:32 AM  Advanced Directives  Does Patient Have a Medical Advance Directive? No  Would patient like information on creating a medical advance  directive? No - Patient declined    Vision: Annual vision screenings are recommended for early detection of glaucoma, cataracts, and diabetic retinopathy. These exams can also reveal signs of chronic conditions such as diabetes and high blood pressure.  Dental: Annual dental screenings help detect early signs of oral cancer, gum disease, and other conditions linked to overall health, including heart disease and diabetes.  Please see the attached documents for additional preventive care recommendations.

## 2024-10-01 LAB — OPHTHALMOLOGY REPORT-SCANNED

## 2024-10-03 ENCOUNTER — Telehealth: Payer: Self-pay | Admitting: Podiatry

## 2024-10-03 NOTE — Telephone Encounter (Signed)
 Received orthotics in [place] office. Left a message for Diya to call [facility] to schedule an appointment to [facility] or she can pick them up at her appt on 10/15/2024. Sending orthotics today to [facility] with [facility].

## 2024-10-08 ENCOUNTER — Encounter: Admitting: Internal Medicine

## 2024-10-15 ENCOUNTER — Ambulatory Visit: Admitting: Podiatry

## 2024-10-15 DIAGNOSIS — Q666 Other congenital valgus deformities of feet: Secondary | ICD-10-CM

## 2024-10-15 DIAGNOSIS — M722 Plantar fascial fibromatosis: Secondary | ICD-10-CM

## 2024-10-17 ENCOUNTER — Ambulatory Visit: Admitting: Cardiovascular Disease

## 2024-10-25 ENCOUNTER — Encounter

## 2024-11-06 ENCOUNTER — Ambulatory Visit: Admitting: Cardiovascular Disease

## 2024-11-15 ENCOUNTER — Encounter: Admitting: Internal Medicine

## 2025-10-01 ENCOUNTER — Ambulatory Visit
